# Patient Record
Sex: Female | Born: 1965 | Race: Black or African American | Hispanic: No | Marital: Married | Smoking: Never smoker
Health system: Southern US, Community
[De-identification: ages and names within clinical notes are randomized; demographics above are authoritative.]

## PROBLEM LIST (undated history)

## (undated) DIAGNOSIS — D649 Anemia, unspecified: Secondary | ICD-10-CM

## (undated) DIAGNOSIS — M7500 Adhesive capsulitis of unspecified shoulder: Secondary | ICD-10-CM

## (undated) DIAGNOSIS — I2510 Atherosclerotic heart disease of native coronary artery without angina pectoris: Secondary | ICD-10-CM

## (undated) DIAGNOSIS — F419 Anxiety disorder, unspecified: Secondary | ICD-10-CM

## (undated) DIAGNOSIS — M1990 Unspecified osteoarthritis, unspecified site: Secondary | ICD-10-CM

## (undated) HISTORY — PX: TUBAL LIGATION: SHX77

## (undated) HISTORY — DX: Anxiety disorder, unspecified: F41.9

## (undated) HISTORY — DX: Unspecified osteoarthritis, unspecified site: M19.90

## (undated) HISTORY — DX: Adhesive capsulitis of unspecified shoulder: M75.00

## (undated) HISTORY — DX: Anemia, unspecified: D64.9

---

## 1997-09-12 ENCOUNTER — Inpatient Hospital Stay (HOSPITAL_COMMUNITY): Admission: AD | Admit: 1997-09-12 | Discharge: 1997-09-14 | Payer: Self-pay | Admitting: Obstetrics and Gynecology

## 1999-12-17 ENCOUNTER — Other Ambulatory Visit: Admission: RE | Admit: 1999-12-17 | Discharge: 1999-12-17 | Payer: Self-pay | Admitting: Obstetrics and Gynecology

## 2001-03-08 ENCOUNTER — Other Ambulatory Visit: Admission: RE | Admit: 2001-03-08 | Discharge: 2001-03-08 | Payer: Self-pay | Admitting: Obstetrics and Gynecology

## 2001-11-24 ENCOUNTER — Encounter: Payer: Self-pay | Admitting: Family Medicine

## 2001-11-24 ENCOUNTER — Encounter: Admission: RE | Admit: 2001-11-24 | Discharge: 2001-11-24 | Payer: Self-pay | Admitting: Family Medicine

## 2002-04-22 ENCOUNTER — Other Ambulatory Visit: Admission: RE | Admit: 2002-04-22 | Discharge: 2002-04-22 | Payer: Self-pay | Admitting: Obstetrics and Gynecology

## 2004-07-16 ENCOUNTER — Ambulatory Visit (HOSPITAL_COMMUNITY): Admission: RE | Admit: 2004-07-16 | Discharge: 2004-07-16 | Payer: Self-pay | Admitting: Obstetrics and Gynecology

## 2005-05-09 ENCOUNTER — Emergency Department (HOSPITAL_COMMUNITY): Admission: EM | Admit: 2005-05-09 | Discharge: 2005-05-09 | Payer: Self-pay | Admitting: Family Medicine

## 2005-09-10 ENCOUNTER — Emergency Department (HOSPITAL_COMMUNITY): Admission: EM | Admit: 2005-09-10 | Discharge: 2005-09-10 | Payer: Self-pay | Admitting: Family Medicine

## 2005-12-17 ENCOUNTER — Emergency Department (HOSPITAL_COMMUNITY): Admission: EM | Admit: 2005-12-17 | Discharge: 2005-12-17 | Payer: Self-pay | Admitting: Family Medicine

## 2006-11-10 ENCOUNTER — Encounter: Admission: RE | Admit: 2006-11-10 | Discharge: 2006-11-10 | Payer: Self-pay | Admitting: Obstetrics and Gynecology

## 2007-02-06 ENCOUNTER — Emergency Department (HOSPITAL_COMMUNITY): Admission: EM | Admit: 2007-02-06 | Discharge: 2007-02-06 | Payer: Self-pay | Admitting: Emergency Medicine

## 2007-06-07 ENCOUNTER — Emergency Department (HOSPITAL_COMMUNITY): Admission: EM | Admit: 2007-06-07 | Discharge: 2007-06-07 | Payer: Self-pay | Admitting: Emergency Medicine

## 2008-01-04 ENCOUNTER — Encounter: Admission: RE | Admit: 2008-01-04 | Discharge: 2008-01-04 | Payer: Self-pay | Admitting: Obstetrics and Gynecology

## 2009-01-04 ENCOUNTER — Encounter: Admission: RE | Admit: 2009-01-04 | Discharge: 2009-01-04 | Payer: Self-pay | Admitting: Obstetrics and Gynecology

## 2009-05-03 ENCOUNTER — Encounter: Payer: Self-pay | Admitting: Gastroenterology

## 2009-05-04 ENCOUNTER — Encounter: Payer: Self-pay | Admitting: Gastroenterology

## 2009-05-23 ENCOUNTER — Ambulatory Visit: Payer: Self-pay | Admitting: Gastroenterology

## 2009-05-23 DIAGNOSIS — K625 Hemorrhage of anus and rectum: Secondary | ICD-10-CM

## 2009-05-23 DIAGNOSIS — K5900 Constipation, unspecified: Secondary | ICD-10-CM | POA: Insufficient documentation

## 2009-05-23 DIAGNOSIS — K648 Other hemorrhoids: Secondary | ICD-10-CM

## 2009-05-23 DIAGNOSIS — R195 Other fecal abnormalities: Secondary | ICD-10-CM

## 2009-05-24 ENCOUNTER — Ambulatory Visit: Payer: Self-pay | Admitting: Gastroenterology

## 2009-05-30 ENCOUNTER — Encounter: Payer: Self-pay | Admitting: Gastroenterology

## 2010-01-07 ENCOUNTER — Encounter: Admission: RE | Admit: 2010-01-07 | Discharge: 2010-01-07 | Payer: Self-pay | Admitting: Obstetrics and Gynecology

## 2010-05-07 NOTE — Letter (Signed)
Summary: [facility]   Imported By: Sherian Rein 09/06/2009 11:35:06  _____________________________________________________________________  External Attachment:    Type:   Image     Comment:   External Document

## 2010-05-07 NOTE — Letter (Signed)
Summary: Patient Notice- Polyp Results  [facility]  [address] [place], [place] [address]   Phone: [phone]  Fax: [phone]        May 30, 2009 MRN: [id number]    [facility] [address] [place], [place]  [address]    Dear Ms. Hoback,  I am pleased to inform you that the colon polyp(s) removed during your recent colonoscopy was (were) found to be benign (no cancer detected) upon pathologic examination.  I recommend you have a repeat colonoscopy examination in 5_ years to look for recurrent polyps, as having colon polyps increases your risk for having recurrent polyps or even colon cancer in the future.  Should you develop new or worsening symptoms of abdominal pain, bowel habit changes or bleeding from the rectum or bowels, please schedule an evaluation with either your primary care physician or with me.  Additional information/recommendations:  __ No further action with gastroenterology is needed at this time. Please      follow-up with your primary care physician for your other healthcare      needs.  __ Please call [phone] to schedule a return visit to review your      situation.  __ Please keep your follow-up visit as already scheduled.  _x_ Continue treatment plan as outlined the day of your exam.  Please call us if you are having persistent problems or have questions about your condition that have not been fully answered at this time.  Sincerely,  Louis Meckel MD  This letter has been electronically signed by your physician.  Appended Document: Patient Notice- Polyp Results  Letter mailed 2.24.11

## 2010-05-07 NOTE — Assessment & Plan Note (Signed)
Summary: blood in stools...em   History of Present Illness Visit Type: Initial Consult Primary GI MD: Melvia Heaps MD [facility] Primary Provider: Lavada Mesi, MD Requesting Provider: Lavada Mesi, MD Chief Complaint: rectal bleeding on tissue, constipaiton History of Present Illness:   [name] is a 45 year old African American female referred at the request of Dr. Prince Rome for rectal bleeding.  She has had constipation for years.  She has the sensation to move her bowels but has to strain to evacuate the stool.  Symptoms have not improved with amitiza.  She has had blood mixed with her stools and on the toilet tissue and has a history of fissures and hemorrhoids.   GI Review of Systems      Denies abdominal pain, acid reflux, belching, bloating, chest pain, dysphagia with liquids, dysphagia with solids, heartburn, loss of appetite, nausea, vomiting, vomiting blood, weight loss, and  weight gain.      Reports change in bowel habits, constipation, hemorrhoids, and  rectal bleeding.     Denies anal fissure, black tarry stools, diarrhea, diverticulosis, fecal incontinence, heme positive stool, irritable bowel syndrome, jaundice, light color stool, liver problems, and  rectal pain. Preventive Screening-Counseling & Management  Alcohol-Tobacco     Smoking Status: never      Drug Use:  no.      Current Medications (verified): 1)  Amitiza 24 Mcg Caps (Lubiprostone) .Marland Kitchen.. 1 By Mouth Two Times A Day 2)  Oyst-Cal D 250-125 Mg-Unit Tabs (Calcium Carbonate-Vitamin D) .Marland Kitchen.. 1 Tablet By Mouth Once Daily  Allergies (verified): No Known Drug Allergies  Past History:  Past Medical History: Anemia Anxiety Disorder Hemorrhoids  Past Surgical History: tubal ligation  Family History: Reviewed history and no changes required. Family History of Breast Cancer:MGM Aunt x 2 No FH of Colon Cancer:  Social History: Reviewed history and no changes required. Occupation: Product/process development scientist Patient has never  smoked.  Alcohol Use - yes Daily Caffeine Use Illicit Drug Use - no Smoking Status:  never Drug Use:  no  Review of Systems  The patient denies allergy/sinus, anemia, anxiety-new, arthritis/joint pain, back pain, blood in urine, breast changes/lumps, change in vision, confusion, cough, coughing up blood, depression-new, fainting, fatigue, fever, headaches-new, hearing problems, heart murmur, heart rhythm changes, itching, menstrual pain, muscle pains/cramps, night sweats, nosebleeds, pregnancy symptoms, shortness of breath, skin rash, sleeping problems, sore throat, swelling of feet/legs, swollen lymph glands, thirst - excessive , urination - excessive , urination changes/pain, urine leakage, vision changes, and voice change.    Vital Signs:  Patient profile:   45 year old female Height:      67 inches Weight:      157 pounds BMI:     24.68 Pulse rate:   84 / minute Pulse rhythm:   regular BP sitting:   120 / 70  (left arm)  Vitals Entered By: Milford Cage NCMA (May 23, 2009 1:59 PM)  Physical Exam  Additional Exam:  She is a well-developed well-nourished female  skin: anicter  HEENT: normocephalic; PEERLA; no nasal or orpharyngeal abnormalities neck: supple nodes: no cervical adenopathy chest: clear cor:  no murmurs, gallops or rubs abd:  bowel sounds normoactive; no abdominal masses, tenderness, organomegaly rectal: no masses;external hemorrhoids are present.  She has a palpable fissure.  Stool is red and Hemoccult-positive ext: no cyanosis, clubbing, or edema skeletal: no gross skeletal abnormalities neuro: alert, oriented x 3; no focal abnormalities    Impression & Recommendations:  Problem # 1:  CONSTIPATION (ICD-564.00) She may  have functional constipation though symptoms suggest  possible pelvic floor dysfunction.  Recommendations #1 D/C amitiza #2 colonoscopy #3 consider anal manometry and biofeedback therapy  Problem # 2:  RECTAL BLEEDING  (ICD-569.3) She has a local rectal disease including hemorrhoids and probable fissure.   No effective therapy can be done until obstipation is corrected.  Other Orders: Colonoscopy (Colon)  Patient Instructions: 1)  Constipation and Hemorrhoids brochure given.  2)  Colonoscopy and Flexible Sigmoidoscopy brochure given.  3)  Conscious Sedation brochure given.  4)  D/C Amitiza 5)  Your Colonoscopy is scheduled for 05/24/2009 at 3pm 6)  You can pick up your MoviPrep from your pharmacy today 7)  CC Dr. Casimiro Needle Hilts  8)  The medication list was reviewed and reconciled.  All changed / newly prescribed medications were explained.  A complete medication list was provided to the patient / caregiver. Prescriptions: MOVIPREP 100 GM  SOLR (PEG-KCL-NACL-NASULF-NA ASC-C) As per prep instructions.  #1 x 0   Entered by:   Merri Ray CMA (AAMA)   Authorized by:   Louis Meckel MD   Signed by:   Merri Ray CMA (AAMA) on 05/23/2009   Method used:   Electronically to        [facility]  [address] [phone]* (retail)       [address]       [place], [place]  [id number]       Ph: [phone] or [phone]       Fax: [phone]   RxID:   [id number]

## 2010-05-07 NOTE — Letter (Signed)
Summary: [facility] Instructions  [facility]  [address] [place], [place] [address]   Phone: [phone]  Fax: [phone]       Jessica Parks    [birth year]    MRN: [id number]        Procedure Day /Date:THURSDAY 05/24/2009     Arrival Time:2PM     Procedure Time:3PM     Location of Procedure:                    X   [facility] (4th Floor)                        PREPARATION FOR COLONOSCOPY WITH MOVIPREP   Starting 5 days prior to your procedure TODAY  do not eat nuts, seeds, popcorn, corn, beans, peas,  salads, or any raw vegetables.  Do not take any fiber supplements (e.g. Metamucil, Citrucel, and Benefiber).  THE DAY BEFORE YOUR PROCEDURE         DATE:05/23/2009  DAY: WED  1.  Drink clear liquids the entire day-NO SOLID FOOD  2.  Do not drink anything colored red or purple.  Avoid juices with pulp.  No orange juice.  3.  Drink at least 64 oz. (8 glasses) of fluid/clear liquids during the day to prevent dehydration and help the prep work efficiently.  CLEAR LIQUIDS INCLUDE: Water Jello Ice Popsicles Tea (sugar ok, no milk/cream) Powdered fruit flavored drinks Coffee (sugar ok, no milk/cream) Gatorade Juice: apple, white grape, white cranberry  Lemonade Clear bullion, consomm, broth Carbonated beverages (any kind) Strained chicken noodle soup Hard Candy                             4.  In the morning, mix first dose of MoviPrep solution:    Empty 1 Pouch A and 1 Pouch B into the disposable container    Add lukewarm drinking water to the top line of the container. Mix to dissolve    Refrigerate (mixed solution should be used within 24 hrs)  5.  Begin drinking the prep at 5:00 p.m. The MoviPrep container is divided by 4 marks.   Every 15 minutes drink the solution down to the next mark (approximately 8 oz) until the full liter is complete.   6.  Follow completed prep with 16 oz of clear liquid of your choice (Nothing red or  purple).  Continue to drink clear liquids until bedtime.  7.  Before going to bed, mix second dose of MoviPrep solution:    Empty 1 Pouch A and 1 Pouch B into the disposable container    Add lukewarm drinking water to the top line of the container. Mix to dissolve    Refrigerate  THE DAY OF YOUR PROCEDURE      DATE: 05/24/2009 DAY: THURSDAY Beginning at 10a.m. (5 hours before procedure):         1. Every 15 minutes, drink the solution down to the next mark (approx 8 oz) until the full liter is complete.  2. Follow completed prep with 16 oz. of clear liquid of your choice.    3. You may drink clear liquids until 1PM(2 HOURS BEFORE PROCEDURE).   MEDICATION INSTRUCTIONS  Unless otherwise instructed, you should take regular prescription medications with a small sip of water   as early as possible the morning of your procedure.  OTHER INSTRUCTIONS  You will need a responsible adult at least 45 years of age to accompany you and drive you home.   This person must remain in the waiting room during your procedure.  Wear loose fitting clothing that is easily removed.  Leave jewelry and other valuables at home.  However, you may wish to bring a book to read or  an iPod/MP3 player to listen to music as you wait for your procedure to start.  Remove all body piercing jewelry and leave at home.  Total time from sign-in until discharge is approximately 2-3 hours.  You should go home directly after your procedure and rest.  You can resume normal activities the  day after your procedure.  The day of your procedure you should not:   Drive   Make legal decisions   Operate machinery   Drink alcohol   Return to work  You will receive specific instructions about eating, activities and medications before you leave.    The above instructions have been reviewed and explained to me by   _______________________    I fully understand and can verbalize these instructions  _____________________________ Date _________

## 2010-05-07 NOTE — Procedures (Signed)
Summary: Colonoscopy  Patient: Jessica Parks Note: All result statuses are Final unless otherwise noted.  Tests: (1) Colonoscopy (COL)   COL Colonoscopy           DONE     [facility]     [address]     [place], [place]  [address]           COLONOSCOPY PROCEDURE REPORT           PATIENT:  Jessica, Parks  MR#:  [id number]     BIRTHDATE:  [birth year], 43 yrs. old  GENDER:  female           ENDOSCOPIST:  Barbette Hair. Arlyce Dice, MD     Referred by:           PROCEDURE DATE:  05/24/2009     PROCEDURE:  Colonoscopy with snare polypectomy     ASA CLASS:  Class I     INDICATIONS:  constipation, heme positive stool           MEDICATIONS:   Fentanyl 100 mcg IV, Versed 10 mg IV, Benadryl 50     mg IV           DESCRIPTION OF PROCEDURE:   After the risks benefits and     alternatives of the procedure were thoroughly explained, informed     consent was obtained.  Digital rectal exam was performed and     revealed perianal skin tags.   The LB [id number] endoscope     was introduced through the anus and advanced to the cecum, which     was identified by both the appendix and ileocecal valve, without     limitations.  The quality of the prep was good, using MoviPrep.     The instrument was then slowly withdrawn as the colon was fully     examined.     <<PROCEDUREIMAGES>>           FINDINGS:  A sessile polyp was found at the hepatic flexure. It     was 2 mm in size. Polyp was snared, then cauterized with monopolar     cautery. Retrieval was successful (see image5). snare polyp     Internal hemorrhoids were found (see image14). Anal tags and     hemorrhoids  This was otherwise a normal examination of the colon     (see image2, image3, image4, image6, image8, image10, image12, and     image13).   Retroflexed views in the rectum revealed no     abnormalities.    The scope was then withdrawn from the patient     and the procedure completed.           COMPLICATIONS:  None       ENDOSCOPIC IMPRESSION:     1) 2 mm sessile polyp at the hepatic flexure     2) Internal hemorrhoids     3) Otherwise normal examination     RECOMMENDATIONS:     1) If the polyp(s) removed today are proven to be adenomatous     (pre-cancerous) polyps, you will need a repeat colonoscopy in 5     years. Otherwise you should continue to follow colorectal cancer     screening guidelines for "routine risk" patients with colonoscopy     in 10 years.     2) call office next 1-3 days to schedule followup visit in 3-4     weeks     3) miralax 1 packet  every 2-3 days as needed           REPEAT EXAM:  In You will receive a letter from Dr. Arlyce Dice in 1-2     weeks, after reviewing the final pathology, with followup     recommendations. for.           ______________________________     Barbette Hair. Arlyce Dice, MD           CC:  Lavada Mesi, MD           n.     Rosalie Doctor:   Barbette Hair. Kaplan at 05/24/2009 03:40 PM           Page 2 of 3   [place], Brigandi Villanueva, [phone]  Note: An exclamation mark (!) indicates a result that was not dispersed into the flowsheet. Document Creation Date: 05/24/2009 3:40 PM _______________________________________________________________________  (1) Order result status: Final Collection or observation date-time: 05/24/2009 15:33 Requested date-time:  Receipt date-time:  Reported date-time:  Referring Physician:   Ordering Physician: Melvia Heaps [phone]) Specimen Source:  Source: Launa Grill Order Number: [id number] Lab site:   Appended Document: Colonoscopy     Procedures Next Due Date:    Colonoscopy: 06/2014

## 2010-05-07 NOTE — Letter (Signed)
Summary: Results Letter   Gastroenterology  [address] [place], [place] [address]   Phone: [phone]  Fax: [phone]        May 23, 2009 MRN: [id number]    [facility] [address] [place], [place]  [address]    Dear Ms. Bergman,  It is my pleasure to have treated you recently as a new patient in my office. I appreciate your confidence and the opportunity to participate in your care.  Since I do have a busy inpatient endoscopy schedule and office schedule, my office hours vary weekly. I am, however, available for emergency calls everyday through my office. If I am not available for an urgent office appointment, another one of our gastroenterologist will be able to assist you.  My well-trained staff are prepared to help you at all times. For emergencies after office hours, a physician from our Gastroenterology section is always available through my 24 hour answering service  Once again I welcome you as a new patient and I look forward to a happy and healthy relationship             Sincerely,  Louis Meckel MD  This letter has been electronically signed by your physician.  Appended Document: Results Letter letter mailed

## 2010-08-23 NOTE — Op Note (Signed)
Jessica Parks, Jessica Parks               ACCOUNT NO.:  [id number]   MEDICAL RECORD NO.:  [id number]          PATIENT TYPE:  AMB   LOCATION:  SDC                           FACILITY:  [facility]   PHYSICIAN:  Maxie Better, M.D.DATE OF BIRTH:  01-19-1966   DATE OF PROCEDURE:  07/16/2004  DATE OF DISCHARGE:                                 OPERATIVE REPORT   PREOPERATIVE DIAGNOSES:  Desires sterilization.   POSTOPERATIVE DIAGNOSES:  Desires sterilization, stage 1 pelvic  endometriosis.   PROCEDURE:  Laparoscopic tubal ligation with bipolar cautery.   ANESTHESIA:  General.   SURGEON:  Maxie Better, M.D.   INDICATIONS FOR PROCEDURE:  This is a 45 year old, gravida 2, para 2 female  who now presents for permanent sterilization, alternative birth control  methods were declined. The risks and benefits of the procedure have been  explained to the patient. Consent was signed. The patient was transferred to  the operating room.   DESCRIPTION OF PROCEDURE:  Under adequate general anesthesia, the patient  was placed in the dorsal lithotomy position, she was sterilely prepped and  draped in the usual fashion. The patient was catheterized of a scant amount  of urine. A bivalve speculum was placed in the vagina, single tooth  tenaculum was placed in the anterior lip of the cervix. An acorn cannula was  introduced in the cervical os and attached to the tenaculum for manipulation  of the uterus. The bivalve speculum was removed. Attention was then turned  to the abdomen. Marcaine 0.25% was injected infraumbilically. An  infraumbilical incision was then made, Veress needle was introduced into the  abdomen, tested with normal saline. Ultimately an opening pressure of 2 was  noted. 3 liters of CO2 was insufflated, Veress needle was removed. A  disposable 10 mm trocar was introduced into the abdomen without incident. A  lighted video laparoscope was then introduced. Inspection of the upper  abdomen  showed a normal liver edge, normal gallbladder. Attention was then  turned to the pelvis. A suprapubic incision was then made and under direct  visualization, a 5 mm port was introduced. The probe was then utilized to  inspect the pelvis. The appendix was noted to be normal. Both tubes and  ovaries were noted to be normal. A small paratubal cyst was noted  bilaterally. The posterior cul-de-sac had some red lesions in the posterior  cul-de-sac on the right in the right ovarian fossa consistent with an  endometriosis. The uterus otherwise was normal. Using bipolar cautery, the  mid portion of both fallopian tubes were then cauterized adequately. Some of  the endometriotic implants were then gently cauterized. When the procedure  was felt to be complete, the suprapubic port was removed under direct  visualization. The abdomen was deflated. The infraumbilical port was then  removed under direct visualization. #0 Vicryl figure-of-eight was placed in  the fascia infraumbilically and the skin incisions were then approximated  using Dermabond. The instruments in the vagina were removed. The cervix  was  inspected for any lacerations and none seen.   SPECIMENS:  None.   ESTIMATED BLOOD LOSS:  Minimal.   COMPLICATIONS:  None.   The patient tolerated the procedure well and was transferred to the recovery  room in stable condition.      [place]/MEDQ  D:  07/16/2004  T:  07/16/2004  Job:  [id number]

## 2010-12-17 ENCOUNTER — Other Ambulatory Visit: Payer: Self-pay | Admitting: Obstetrics and Gynecology

## 2010-12-17 DIAGNOSIS — Z1231 Encounter for screening mammogram for malignant neoplasm of breast: Secondary | ICD-10-CM

## 2010-12-23 ENCOUNTER — Telehealth: Payer: Self-pay | Admitting: Gastroenterology

## 2010-12-23 NOTE — Telephone Encounter (Signed)
Pt states she is having problems with constipation, requesting an appt with Dr. Arlyce Dice. Pt scheduled to see Dr. Arlyce Dice 12/27/10@9 :30am, pt aware of appt date and time.

## 2010-12-27 ENCOUNTER — Ambulatory Visit: Payer: Self-pay | Admitting: Gastroenterology

## 2011-01-10 ENCOUNTER — Ambulatory Visit
Admission: RE | Admit: 2011-01-10 | Discharge: 2011-01-10 | Disposition: A | Discharge status: Home / Self Care | Payer: BC Managed Care – PPO | Source: Ambulatory Visit | Attending: Obstetrics and Gynecology | Admitting: Obstetrics and Gynecology

## 2011-01-10 DIAGNOSIS — Z1231 Encounter for screening mammogram for malignant neoplasm of breast: Secondary | ICD-10-CM

## 2011-05-06 ENCOUNTER — Ambulatory Visit (INDEPENDENT_AMBULATORY_CARE_PROVIDER_SITE_OTHER): Payer: BC Managed Care – PPO | Admitting: Gastroenterology

## 2011-05-06 ENCOUNTER — Encounter: Payer: Self-pay | Admitting: Gastroenterology

## 2011-05-06 DIAGNOSIS — Z860100 Personal history of colon polyps, unspecified: Secondary | ICD-10-CM | POA: Insufficient documentation

## 2011-05-06 DIAGNOSIS — K5900 Constipation, unspecified: Secondary | ICD-10-CM

## 2011-05-06 NOTE — Progress Notes (Signed)
History of Present Illness: Jessica Parks is a pleasant 46 year old black female referred at the request of Dr. Prince Rome for evaluation of constipation. This has been an on going problem for years.  She may go at least a week without a bowel movement. Despite taking prunes and fiber she has to strain and has a sense of incomplete evacuation. She may see blood on the tissue with straining. Colonoscopy in 2007 demonstrated a small adenomatous polyp which was removed.    Past Medical History  Diagnosis Date  . Anemia   . Anxiety disorder   . Hemorrhoids    Past Surgical History  Procedure Date  . Tubal ligation    family history includes Breast cancer in her maternal aunts and maternal grandmother and Diabetes in her maternal aunt.  There is no history of Colon cancer. Current Outpatient Prescriptions  Medication Sig Dispense Refill  . B Complex Vitamins LIQD Place 1 mL under the tongue 2 (two) times a week.      . folic acid (FOLVITE) 1 MG tablet Take 1 mg by mouth daily.       Allergies as of 05/06/2011  . (No Known Allergies)    reports that she has never smoked. She has never used smokeless tobacco. She reports that she drinks alcohol. She reports that she does not use illicit drugs.     Review of Systems: Pertinent positive and negative review of systems were noted in the above HPI section. All other review of systems were otherwise negative.  Vital signs were reviewed in today's medical record Physical Exam: General: Well developed , well nourished, no acute distress Head: Normocephalic and atraumatic Eyes:  sclerae anicteric, EOMI Ears: Normal auditory acuity Mouth: No deformity or lesions Neck: Supple, no masses or thyromegaly Lungs: Clear throughout to auscultation Heart: Regular rate and rhythm; no murmurs, rubs or bruits Abdomen: Soft, non tender and non distended. No masses, hepatosplenomegaly or hernias noted. Normal Bowel sounds Rectal:deferred Musculoskeletal:  Symmetrical with no gross deformities  Skin: No lesions on visible extremities Pulses:  Normal pulses noted Extremities: No clubbing, cyanosis, edema or deformities noted Neurological: Alert oriented x 4, grossly nonfocal Cervical Nodes:  No significant cervical adenopathy Inguinal Nodes: No significant inguinal adenopathy Psychological:  Alert and cooperative. Normal mood and affect     \

## 2011-05-06 NOTE — Assessment & Plan Note (Addendum)
The patient has chronic idiopathic constipation.  Medications #1 she will consider enrollment in a constipation trial. If not, I will start her Linzess 145 mcg daily

## 2011-05-06 NOTE — Assessment & Plan Note (Signed)
Plan colonoscopy 2016 

## 2011-05-07 ENCOUNTER — Telehealth: Payer: Self-pay | Admitting: Gastroenterology

## 2011-05-07 MED ORDER — LINACLOTIDE 145 MCG PO CAPS: 145.0000 ug | ORAL_CAPSULE | Freq: Every day | ORAL | Status: DC

## 2011-05-07 NOTE — Telephone Encounter (Signed)
Spoke with pt and she does not want to do the constipation trial. Pt would like the med called in that Dr. Arlyce Dice mentioned. Rx sent to the pharmacy.

## 2011-05-08 ENCOUNTER — Ambulatory Visit (INDEPENDENT_AMBULATORY_CARE_PROVIDER_SITE_OTHER): Payer: BC Managed Care – PPO

## 2011-05-08 DIAGNOSIS — K5900 Constipation, unspecified: Secondary | ICD-10-CM

## 2011-09-26 ENCOUNTER — Telehealth: Payer: Self-pay | Admitting: Gastroenterology

## 2011-09-26 NOTE — Telephone Encounter (Signed)
Spoke with patient and confirmed recall 2016 for colon.

## 2011-12-02 ENCOUNTER — Other Ambulatory Visit: Payer: Self-pay | Admitting: Obstetrics and Gynecology

## 2011-12-02 DIAGNOSIS — Z1231 Encounter for screening mammogram for malignant neoplasm of breast: Secondary | ICD-10-CM

## 2012-01-12 ENCOUNTER — Ambulatory Visit
Admission: RE | Admit: 2012-01-12 | Discharge: 2012-01-12 | Disposition: A | Discharge status: Home / Self Care | Payer: PRIVATE HEALTH INSURANCE | Source: Ambulatory Visit | Attending: Obstetrics and Gynecology | Admitting: Obstetrics and Gynecology

## 2012-01-12 DIAGNOSIS — Z1231 Encounter for screening mammogram for malignant neoplasm of breast: Secondary | ICD-10-CM

## 2012-04-07 HISTORY — PX: BREAST BIOPSY: SHX20

## 2012-12-21 ENCOUNTER — Other Ambulatory Visit: Payer: Self-pay

## 2012-12-21 DIAGNOSIS — Z1231 Encounter for screening mammogram for malignant neoplasm of breast: Secondary | ICD-10-CM

## 2013-01-14 ENCOUNTER — Ambulatory Visit
Admission: RE | Admit: 2013-01-14 | Discharge: 2013-01-14 | Disposition: A | Discharge status: Home / Self Care | Payer: BC Managed Care – PPO | Source: Ambulatory Visit

## 2013-01-14 DIAGNOSIS — Z1231 Encounter for screening mammogram for malignant neoplasm of breast: Secondary | ICD-10-CM

## 2013-01-18 ENCOUNTER — Other Ambulatory Visit: Payer: Self-pay | Admitting: Obstetrics and Gynecology

## 2013-01-18 DIAGNOSIS — R928 Other abnormal and inconclusive findings on diagnostic imaging of breast: Secondary | ICD-10-CM

## 2013-02-08 ENCOUNTER — Ambulatory Visit
Admission: RE | Admit: 2013-02-08 | Discharge: 2013-02-08 | Disposition: A | Discharge status: Home / Self Care | Payer: BC Managed Care – PPO | Source: Ambulatory Visit | Attending: Obstetrics and Gynecology | Admitting: Obstetrics and Gynecology

## 2013-02-08 ENCOUNTER — Other Ambulatory Visit: Payer: Self-pay | Admitting: Obstetrics and Gynecology

## 2013-02-08 DIAGNOSIS — R928 Other abnormal and inconclusive findings on diagnostic imaging of breast: Secondary | ICD-10-CM

## 2013-02-10 ENCOUNTER — Ambulatory Visit
Admission: RE | Admit: 2013-02-10 | Discharge: 2013-02-10 | Disposition: A | Discharge status: Home / Self Care | Payer: BC Managed Care – PPO | Source: Ambulatory Visit | Attending: Obstetrics and Gynecology | Admitting: Obstetrics and Gynecology

## 2013-02-10 ENCOUNTER — Other Ambulatory Visit: Payer: Self-pay | Admitting: Obstetrics and Gynecology

## 2013-02-10 DIAGNOSIS — R928 Other abnormal and inconclusive findings on diagnostic imaging of breast: Secondary | ICD-10-CM

## 2013-08-12 ENCOUNTER — Ambulatory Visit (INDEPENDENT_AMBULATORY_CARE_PROVIDER_SITE_OTHER): Payer: BC Managed Care – PPO | Admitting: Gastroenterology

## 2013-08-12 ENCOUNTER — Encounter: Payer: Self-pay | Admitting: Gastroenterology

## 2013-08-12 VITALS — BP 90/60 | HR 98 | Ht 66.0 in | Wt 160.0 lb

## 2013-08-12 DIAGNOSIS — Z86010 Personal history of colonic polyps: Secondary | ICD-10-CM

## 2013-08-12 DIAGNOSIS — K5900 Constipation, unspecified: Secondary | ICD-10-CM

## 2013-08-12 DIAGNOSIS — K648 Other hemorrhoids: Secondary | ICD-10-CM

## 2013-08-12 NOTE — Progress Notes (Signed)
_                                                                                                                History of Present Illness: 48 year old Afro-American female with history of chronic idiopathic constipation here for persistent complaints of constipation and hemorrhoids.  She took Linzess in the past which caused crampy pain that was intolerable.  She's used various over-the-counter medications and MiraLax as well.  Despite this she has severe straining at the stool.  Stools are mushy and difficult to evacuate.  She has very frequent rectal discomfort with protrusion of hemorrhoids.  She complains of itching and occasional bleeding.  She's tried various topicals.  Colonoscopy in 2011 demonstrated an adenomatous polyp and internal hemorrhoids.    Past Medical History  Diagnosis Date  . Anemia   . Anxiety disorder   . Hemorrhoids    Past Surgical History  Procedure Laterality Date  . Tubal ligation     family history includes Breast cancer in her maternal aunt, maternal aunt, and maternal grandmother; Diabetes in her maternal aunt. There is no history of Colon cancer. Current Outpatient Prescriptions  Medication Sig Dispense Refill  . B Complex Vitamins LIQD Place 1 mL under the tongue 2 (two) times a week.      . folic acid (FOLVITE) 1 MG tablet Take 1 mg by mouth daily.      Marland Kitchen omega-3 acid ethyl esters (LOVAZA) 1 G capsule Take 1 g by mouth 2 (two) times daily.      . Vitamins A & D (VITAMIN A & D) 5000-400 UNITS CAPS Take 1 capsule by mouth 2 (two) times a week.       No current facility-administered medications for this visit.   Allergies as of 08/12/2013  . (No Known Allergies)    reports that she has never smoked. She has never used smokeless tobacco. She reports that she drinks alcohol. She reports that she does not use illicit drugs.     Review of Systems: Pertinent positive and negative review of systems were noted in the above HPI section.  All other review of systems were otherwise negative.  Vital signs were reviewed in today's medical record Physical Exam: General: Well developed , well nourished, no acute distress Skin: anicteric Head: Normocephalic and atraumatic Eyes:  sclerae anicteric, EOMI Ears: Normal auditory acuity Mouth: No deformity or lesions Neck: Supple, no masses or thyromegaly Lungs: Clear throughout to auscultation Heart: Regular rate and rhythm; no murmurs, rubs or bruits Abdomen: Soft, non tender and non distended. No masses, hepatosplenomegaly or hernias noted. Normal Bowel sounds Rectal: A large grade 3 hemorrhoids and external skin tags are present Musculoskeletal: Symmetrical with no gross deformities  Skin: No lesions on visible extremities Pulses:  Normal pulses noted Extremities: No clubbing, cyanosis, edema or deformities noted Neurological: Alert oriented x 4, grossly nonfocal Cervical Nodes:  No significant cervical adenopathy Inguinal Nodes: No significant inguinal adenopathy Psychological:  Alert and cooperative. Normal mood and affect  See  Assessment and Plan under Problem List

## 2013-08-12 NOTE — Assessment & Plan Note (Signed)
Plan follow-up colonoscopy 2016 

## 2013-08-12 NOTE — Assessment & Plan Note (Signed)
Patient has symptomatic internal hemorrhoids.  Once constipation is resolved I will proceed with band ligation

## 2013-08-12 NOTE — Patient Instructions (Signed)
We are giving you Amitiza samples today Take Benefiber daily two times daily Call back in 1 week if no better

## 2013-08-12 NOTE — Assessment & Plan Note (Signed)
She continues to suffer from constipation which undoubtedly is exacerbating hemorrhoids.    Recommendations #1 trial of amitiza 24 mcg twice a day; Benefiber twice a day

## 2014-02-22 ENCOUNTER — Other Ambulatory Visit: Payer: Self-pay

## 2014-02-22 DIAGNOSIS — Z1231 Encounter for screening mammogram for malignant neoplasm of breast: Secondary | ICD-10-CM

## 2014-03-17 ENCOUNTER — Ambulatory Visit: Payer: BC Managed Care – PPO

## 2014-03-17 ENCOUNTER — Ambulatory Visit
Admission: RE | Admit: 2014-03-17 | Discharge: 2014-03-17 | Disposition: A | Discharge status: Home / Self Care | Payer: BC Managed Care – PPO | Source: Ambulatory Visit

## 2014-03-17 DIAGNOSIS — Z1231 Encounter for screening mammogram for malignant neoplasm of breast: Secondary | ICD-10-CM

## 2014-04-27 ENCOUNTER — Ambulatory Visit (INDEPENDENT_AMBULATORY_CARE_PROVIDER_SITE_OTHER): Payer: BLUE CROSS/BLUE SHIELD | Admitting: Gastroenterology

## 2014-04-27 ENCOUNTER — Encounter: Payer: Self-pay | Admitting: Gastroenterology

## 2014-04-27 VITALS — BP 124/74 | HR 76 | Ht 66.75 in | Wt 158.4 lb

## 2014-04-27 DIAGNOSIS — K648 Other hemorrhoids: Secondary | ICD-10-CM

## 2014-04-27 DIAGNOSIS — Z86010 Personal history of colonic polyps: Secondary | ICD-10-CM

## 2014-04-27 NOTE — Progress Notes (Signed)
Patient is here today to discuss treatment of hemorrhoids.  She has chronic and recurrent itching and rectal discomfort.  She's tried various topicals with only temporary success.  PROCEDURE NOTE: The patient presents with symptomatic grade *3**  hemorrhoids, requesting rubber band ligation of his/her hemorrhoidal disease.  All risks, benefits and alternative forms of therapy were described and informed consent was obtained.   The anorectum was pre-medicated with lubricant and nitroglycerine ointment The decision was made to band the *right posterior* internal hemorrhoid, and the [place] was used to perform band ligation without complication.  Digital anorectal examination was then performed to assure proper positioning of the band, and to adjust the banded tissue as required.  The patient was discharged home without pain or other issues.  Dietary and behavioral recommendations were given and along with follow-up instructions.    The patient will return in *2** weeks for  follow-up and possible additional banding as required. No complications were encountered and the patient tolerated the procedure well.

## 2014-04-27 NOTE — Addendum Note (Signed)
Addended by: Erskine Emery D on: 04/27/2014 10:09 AM   Modules accepted: Level of Service

## 2014-04-27 NOTE — Assessment & Plan Note (Signed)
Plan follow-up colonoscopy for history of adenomatous polyps

## 2014-04-27 NOTE — Patient Instructions (Signed)
It has been recommended to you by your physician that you have a(n) Colonoscopy completed. Per your request, we did not schedule the procedure(s) today. Please contact our office at [phone] should you decide to have the procedure completed.  Your 2nd banding is scheduled on 06/14/2014 at 10:45am  [place]   1. The procedure you have had should have been relatively painless since the banding of the area involved does not have nerve endings and there is no pain sensation.  The rubber band cuts off the blood supply to the hemorrhoid and the band may fall off as soon as 48 hours after the banding (the band may occasionally be seen in the toilet bowl following a bowel movement). You may notice a temporary feeling of fullness in the rectum which should respond adequately to plain Tylenol or Motrin.  2. Following the banding, avoid strenuous exercise that evening and resume full activity the next day.  A sitz bath (soaking in a warm tub) or bidet is soothing, and can be useful for cleansing the area after bowel movements.     3. To avoid constipation, take two tablespoons of natural wheat bran, natural oat bran, flax, Benefiber or any over the counter fiber supplement and increase your water intake to 7-8 glasses daily.    4. Unless you have been prescribed anorectal medication, do not put anything inside your rectum for two weeks: No suppositories, enemas, fingers, etc.  5. Occasionally, you may have more bleeding than usual after the banding procedure.  This is often from the untreated hemorrhoids rather than the treated one.  Don't be concerned if there is a tablespoon or so of blood.  If there is more blood than this, lie flat with your bottom higher than your head and apply an ice pack to the area. If the bleeding does not stop within a half an hour or if you feel faint, call our office at [phone] or go to the emergency room.  6. Problems are not  common; however, if there is a substantial amount of bleeding, severe pain, chills, fever or difficulty passing urine (very rare) or other problems, you should call us at [phone] or report to the nearest emergency room.  7. Do not stay seated continuously for more than 2-3 hours for a day or two after the procedure.  Tighten your buttock muscles 10-15 times every two hours and take 10-15 deep breaths every 1-2 hours.  Do not spend more than a few minutes on the toilet if you cannot empty your bowel; instead re-visit the toilet at a later time.

## 2014-06-14 ENCOUNTER — Encounter: Payer: Self-pay | Admitting: Gastroenterology

## 2014-06-14 ENCOUNTER — Encounter: Payer: BLUE CROSS/BLUE SHIELD | Admitting: Gastroenterology

## 2014-06-14 ENCOUNTER — Ambulatory Visit (INDEPENDENT_AMBULATORY_CARE_PROVIDER_SITE_OTHER): Payer: BLUE CROSS/BLUE SHIELD | Admitting: Gastroenterology

## 2014-06-14 VITALS — BP 110/60 | HR 76 | Ht 66.75 in | Wt 158.2 lb

## 2014-06-14 DIAGNOSIS — K642 Third degree hemorrhoids: Secondary | ICD-10-CM

## 2014-06-14 DIAGNOSIS — K648 Other hemorrhoids: Secondary | ICD-10-CM

## 2014-06-14 NOTE — Patient Instructions (Signed)
HEMORRHOID BANDING PROCEDURE    FOLLOW-UP CARE   1. The procedure you have had should have been relatively painless since the banding of the area involved does not have nerve endings and there is no pain sensation.  The rubber band cuts off the blood supply to the hemorrhoid and the band may fall off as soon as 48 hours after the banding (the band may occasionally be seen in the toilet bowl following a bowel movement). You may notice a temporary feeling of fullness in the rectum which should respond adequately to plain Tylenol or Motrin.  2. Following the banding, avoid strenuous exercise that evening and resume full activity the next day.  A sitz bath (soaking in a warm tub) or bidet is soothing, and can be useful for cleansing the area after bowel movements.     3. To avoid constipation, take two tablespoons of natural wheat bran, natural oat bran, flax, Benefiber or any over the counter fiber supplement and increase your water intake to 7-8 glasses daily.    4. Unless you have been prescribed anorectal medication, do not put anything inside your rectum for two weeks: No suppositories, enemas, fingers, etc.  5. Occasionally, you may have more bleeding than usual after the banding procedure.  This is often from the untreated hemorrhoids rather than the treated one.  Don't be concerned if there is a tablespoon or so of blood.  If there is more blood than this, lie flat with your bottom higher than your head and apply an ice pack to the area. If the bleeding does not stop within a half an hour or if you feel faint, call our office at [phone] or go to the emergency room.  6. Problems are not common; however, if there is a substantial amount of bleeding, severe pain, chills, fever or difficulty passing urine (very rare) or other problems, you should call us at [phone] or report to the nearest emergency room.  7. Do not stay seated continuously for more than 2-3 hours for a day or two  after the procedure.  Tighten your buttock muscles 10-15 times every two hours and take 10-15 deep breaths every 1-2 hours.  Do not spend more than a few minutes on the toilet if you cannot empty your bowel; instead re-visit the toilet at a later time.   Your 3rd banding is scheduled on 07/28/2014 at 10:15am

## 2014-06-14 NOTE — Progress Notes (Signed)
PROCEDURE NOTE: The patient presents with symptomatic grade **3*  hemorrhoids, requesting rubber band ligation of his/her hemorrhoidal disease.  All risks, benefits and alternative forms of therapy were described and informed consent was obtained.   The anorectum was pre-medicated with lubricant and nitroglycerine ointment The decision was made to band the **right anterior* internal hemorrhoid, and the [place] was used to perform band ligation without complication.  Digital anorectal examination was then performed to assure proper positioning of the band, and to adjust the banded tissue as required.  The patient was discharged home without pain or other issues.  Dietary and behavioral recommendations were given and along with follow-up instructions.    The patient will return in *2** weeks for  follow-up and possible additional banding as required. No complications were encountered and the patient tolerated the procedure well.

## 2014-06-16 ENCOUNTER — Telehealth: Payer: Self-pay | Admitting: Gastroenterology

## 2014-06-16 ENCOUNTER — Other Ambulatory Visit: Payer: Self-pay

## 2014-06-16 NOTE — Telephone Encounter (Signed)
Work note for patient. She is aware to pick up the letter from the front desk of our office.

## 2014-07-28 ENCOUNTER — Telehealth: Payer: Self-pay | Admitting: *Deleted

## 2014-07-28 ENCOUNTER — Encounter: Payer: Self-pay | Admitting: Gastroenterology

## 2014-07-28 ENCOUNTER — Ambulatory Visit (INDEPENDENT_AMBULATORY_CARE_PROVIDER_SITE_OTHER): Payer: BLUE CROSS/BLUE SHIELD | Admitting: Gastroenterology

## 2014-07-28 VITALS — BP 120/70 | HR 88 | Ht 66.75 in | Wt 159.4 lb

## 2014-07-28 DIAGNOSIS — K648 Other hemorrhoids: Secondary | ICD-10-CM

## 2014-07-28 NOTE — Telephone Encounter (Signed)
===  View-only below this line===  ----- Message -----    From: Inda Castle, MD    Sent: 07/28/2014   1:51 PM      To: Oda Kilts, CMA Subject: RE: Colonoscopy                                Yes she is due for colonoscopy ----- Message -----    From: Oda Kilts, CMA    Sent: 07/28/2014  11:25 AM      To: Inda Castle, MD Subject: Colonoscopy                                    Is she due for a colonoscopy?? There is no recall in the system on her.......Marland Kitchen

## 2014-07-28 NOTE — Progress Notes (Signed)
PROCEDURE NOTE: The patient presents with symptomatic grade **3*  hemorrhoids, requesting rubber band ligation of his/her hemorrhoidal disease.  All risks, benefits and alternative forms of therapy were described and informed consent was obtained.   The anorectum was pre-medicated with lubricant and nitroglycerine ointment The decision was made to band the *left lateral** internal hemorrhoid, and the [place] was used to perform band ligation without complication.  Digital anorectal examination was then performed to assure proper positioning of the band, and to adjust the banded tissue as required.  The patient was discharged home without pain or other issues.  Dietary and behavioral recommendations were given and along with follow-up instructions.    The patient will return in **4* weeks for  follow-up No complications were encountered and the patient tolerated the procedure well.

## 2014-07-28 NOTE — Patient Instructions (Signed)
HEMORRHOID BANDING PROCEDURE    FOLLOW-UP CARE   1. The procedure you have had should have been relatively painless since the banding of the area involved does not have nerve endings and there is no pain sensation.  The rubber band cuts off the blood supply to the hemorrhoid and the band may fall off as soon as 48 hours after the banding (the band may occasionally be seen in the toilet bowl following a bowel movement). You may notice a temporary feeling of fullness in the rectum which should respond adequately to plain Tylenol or Motrin.  2. Following the banding, avoid strenuous exercise that evening and resume full activity the next day.  A sitz bath (soaking in a warm tub) or bidet is soothing, and can be useful for cleansing the area after bowel movements.     3. To avoid constipation, take two tablespoons of natural wheat bran, natural oat bran, flax, Benefiber or any over the counter fiber supplement and increase your water intake to 7-8 glasses daily.    4. Unless you have been prescribed anorectal medication, do not put anything inside your rectum for two weeks: No suppositories, enemas, fingers, etc.  5. Occasionally, you may have more bleeding than usual after the banding procedure.  This is often from the untreated hemorrhoids rather than the treated one.  Don't be concerned if there is a tablespoon or so of blood.  If there is more blood than this, lie flat with your bottom higher than your head and apply an ice pack to the area. If the bleeding does not stop within a half an hour or if you feel faint, call our office at [phone] or go to the emergency room.  6. Problems are not common; however, if there is a substantial amount of bleeding, severe pain, chills, fever or difficulty passing urine (very rare) or other problems, you should call us at [phone] or report to the nearest emergency room.  7. Do not stay seated continuously for more than 2-3 hours for a day or two  after the procedure.  Tighten your buttock muscles 10-15 times every two hours and take 10-15 deep breaths every 1-2 hours.  Do not spend more than a few minutes on the toilet if you cannot empty your bowel; instead re-visit the toilet at a later time.   Your Follow up appointment is scheduled on 09/25/2014 at 9:45am

## 2014-07-28 NOTE — Telephone Encounter (Signed)
Called patient to inform she needs a colonoscopy she will call back to schedule

## 2014-09-25 ENCOUNTER — Ambulatory Visit: Payer: BLUE CROSS/BLUE SHIELD | Admitting: Gastroenterology

## 2015-01-15 ENCOUNTER — Encounter: Payer: Self-pay | Admitting: Gastroenterology

## 2015-02-07 ENCOUNTER — Other Ambulatory Visit: Payer: Self-pay | Admitting: Obstetrics and Gynecology

## 2015-02-07 DIAGNOSIS — R928 Other abnormal and inconclusive findings on diagnostic imaging of breast: Secondary | ICD-10-CM

## 2015-02-09 ENCOUNTER — Ambulatory Visit
Admission: RE | Admit: 2015-02-09 | Discharge: 2015-02-09 | Disposition: A | Discharge status: Home / Self Care | Payer: BLUE CROSS/BLUE SHIELD | Source: Ambulatory Visit | Attending: Obstetrics and Gynecology | Admitting: Obstetrics and Gynecology

## 2015-02-09 DIAGNOSIS — R928 Other abnormal and inconclusive findings on diagnostic imaging of breast: Secondary | ICD-10-CM

## 2015-03-02 ENCOUNTER — Encounter: Payer: Self-pay | Admitting: Gastroenterology

## 2015-03-16 ENCOUNTER — Ambulatory Visit (AMBULATORY_SURGERY_CENTER): Payer: Self-pay

## 2015-03-16 VITALS — Ht 67.0 in | Wt 163.0 lb

## 2015-03-16 DIAGNOSIS — Z860100 Personal history of colon polyps, unspecified: Secondary | ICD-10-CM

## 2015-03-16 MED ORDER — SUPREP BOWEL PREP KIT 17.5-3.13-1.6 GM/177ML PO SOLN
1.0000 | Freq: Once | ORAL | Status: DC
Start: 2015-03-16 — End: 2015-03-30

## 2015-03-16 NOTE — Progress Notes (Signed)
No allergies to eggs or soy or flu shot No diet/weight loss meds No home oxygen No past problems with anesthesia  Has email and internet; registered emmi

## 2015-03-19 ENCOUNTER — Encounter: Payer: Self-pay | Admitting: Gastroenterology

## 2015-03-30 ENCOUNTER — Telehealth: Payer: Self-pay | Admitting: *Deleted

## 2015-03-30 ENCOUNTER — Ambulatory Visit (AMBULATORY_SURGERY_CENTER): Payer: BLUE CROSS/BLUE SHIELD | Admitting: Gastroenterology

## 2015-03-30 ENCOUNTER — Encounter: Payer: Self-pay | Admitting: Gastroenterology

## 2015-03-30 ENCOUNTER — Telehealth: Payer: Self-pay | Admitting: Gastroenterology

## 2015-03-30 VITALS — BP 102/67 | HR 64 | Temp 98.6°F | Resp 20 | Ht 67.0 in | Wt 163.0 lb

## 2015-03-30 DIAGNOSIS — K635 Polyp of colon: Secondary | ICD-10-CM

## 2015-03-30 DIAGNOSIS — Z86010 Personal history of colonic polyps: Secondary | ICD-10-CM | POA: Diagnosis not present

## 2015-03-30 DIAGNOSIS — D122 Benign neoplasm of ascending colon: Secondary | ICD-10-CM

## 2015-03-30 MED ORDER — SODIUM CHLORIDE 0.9 % IV SOLN: 500.0000 mL | INTRAVENOUS | Status: DC

## 2015-03-30 NOTE — Progress Notes (Signed)
Called to room to assist during endoscopic procedure.  Patient ID and intended procedure confirmed with present staff. Received instructions for my participation in the procedure from the performing physician.  

## 2015-03-30 NOTE — Telephone Encounter (Signed)
Patient does not have a preference for surgeon. Per procedure report- needs referral to surgeon to evaluate/remove anal canal polyp and evaluate large skin tag. Spoke with Janett Billow at [facility] and scheduled with Dr. Johney Maine on 04/23/15 at 8:15 AM. Patient notified of appointment.

## 2015-03-30 NOTE — Progress Notes (Signed)
Report to PACU, RN, vss, BBS= Clear.  

## 2015-03-30 NOTE — Telephone Encounter (Signed)
Advised pt our recommendation is to stay home the remainder of the day and she can resume her normal activities tomorrow.

## 2015-03-30 NOTE — Patient Instructions (Signed)
YOU HAD AN ENDOSCOPIC PROCEDURE TODAY AT [facility]:   Refer to the procedure report that was given to you for any specific questions about what was found during the examination.  If the procedure report does not answer your questions, please call your gastroenterologist to clarify.  If you requested that your care partner not be given the details of your procedure findings, then the procedure report has been included in a sealed envelope for you to review at your convenience later.  YOU SHOULD EXPECT: Some feelings of bloating in the abdomen. Passage of more gas than usual.  Walking can help get rid of the air that was put into your GI tract during the procedure and reduce the bloating. If you had a lower endoscopy (such as a colonoscopy or flexible sigmoidoscopy) you may notice spotting of blood in your stool or on the toilet paper. If you underwent a bowel prep for your procedure, you may not have a normal bowel movement for a few days.  Please Note:  You might notice some irritation and congestion in your nose or some drainage.  This is from the oxygen used during your procedure.  There is no need for concern and it should clear up in a day or so.  SYMPTOMS TO REPORT IMMEDIATELY:   Following lower endoscopy (colonoscopy or flexible sigmoidoscopy):  Excessive amounts of blood in the stool  Significant tenderness or worsening of abdominal pains  Swelling of the abdomen that is new, acute  Fever of 100F or higher   For urgent or emergent issues, a gastroenterologist can be reached at any hour by calling [phone].   DIET: Your first meal following the procedure should be a small meal and then it is ok to progress to your normal diet. Heavy or fried foods are harder to digest and may make you feel nauseous or bloated.  Likewise, meals heavy in dairy and vegetables can increase bloating.  Drink plenty of fluids but you should avoid alcoholic beverages for 24  hours.  ACTIVITY:  You should plan to take it easy for the rest of today and you should NOT DRIVE or use heavy machinery until tomorrow (because of the sedation medicines used during the test).    FOLLOW UP: Our staff will call the number listed on your records the next business day following your procedure to check on you and address any questions or concerns that you may have regarding the information given to you following your procedure. If we do not reach you, we will leave a message.  However, if you are feeling well and you are not experiencing any problems, there is no need to return our call.  We will assume that you have returned to your regular daily activities without incident.  If any biopsies were taken you will be contacted by phone or by letter within the next 1-3 weeks.  Please call us at [phone] if you have not heard about the biopsies in 3 weeks.    SIGNATURES/CONFIDENTIALITY: You and/or your care partner have signed paperwork which will be entered into your electronic medical record.  These signatures attest to the fact that that the information above on your After Visit Summary has been reviewed and is understood.  Full responsibility of the confidentiality of this discharge information lies with you and/or your care-partner.  Polyp/ hemorrhoid handout given Await pathology results General Surgery to evaluate Rectal polyp

## 2015-03-30 NOTE — Op Note (Signed)
[facility]  [address] [place], [address]   COLONOSCOPY PROCEDURE REPORT  PATIENT: Jessica Parks  MR#: [id number] BIRTHDATE: 28-Oct-1965 , 49  yrs. old GENDER: female ENDOSCOPIST: Yetta Flock, MD REFERRED BY: Willey Blade MD PROCEDURE DATE:  03/30/2015 PROCEDURE:   Colonoscopy, surveillance and Colonoscopy with biopsy First Screening Colonoscopy - Avg.  risk and is 50 yrs.  old or older - No.  Prior Negative Screening - Now for repeat screening. N/A  History of Adenoma - Now for follow-up colonoscopy & has been > or = to 3 yrs.  Yes hx of adenoma.  Has been 3 or more years since last colonoscopy.  Polyps removed today? Yes ASA CLASS:   Class II INDICATIONS:Surveillance due to prior colonic neoplasia and Colorectal Neoplasm Risk Assessment for this procedure is average risk. MEDICATIONS: Propofol 400 mg IV  DESCRIPTION OF PROCEDURE:   After the risks benefits and alternatives of the procedure were thoroughly explained, informed consent was obtained.  The digital rectal exam revealed external hemorrhoids and revealed a skin tag.   The [id number] endoscope was introduced through the anus and advanced to the cecum, which was identified by both the appendix and ileocecal valve. No adverse events experienced.   The quality of the prep was good.  The instrument was then slowly withdrawn as the colon was fully examined. Estimated blood loss is zero unless otherwise noted in this procedure report.   COLON FINDINGS: A sessile polyp measuring 3 mm in size was found in the ascending colon.  A polypectomy was performed with cold forceps.  The resection was complete, the polyp tissue was completely retrieved and sent to histology.   The examination was otherwise normal.   On retroflexion there was evidence of hypertrophied anal papillae and what appeared to be a pedunculated anal canal polyp arising from below the dentate line.  Multiple images  obtained.  Retroflexion as described previously. The time to cecum = 4.2 Withdrawal time = 16.7   The scope was withdrawn and the procedure completed. COMPLICATIONS: There were no immediate complications.  ENDOSCOPIC IMPRESSION: 1.   Sessile polyp was found in the ascending colon; polypectomy was performed with cold forceps 2.   The examination was otherwise normal 3.  On retroflexion there was evidence of hypertrophied anal papillae and what appeared to be a pedunculated anal canal polyp arising from below the dentate line.  Multiple images obtained 4.   Large external skin tag / hemorrhoid  RECOMMENDATIONS: Await pathology results Resume diet Resume medications General surgery consult to evaluate / remove anal canal polyp, and evaluate large skin tag   eSigned:  Yetta Flock, MD 03/30/2015 8:14 AM   cc: Willey Blade MD, the patient   PATIENT NAME:  Jessica Parks, Jessica Parks MR#: [id number]

## 2015-04-03 ENCOUNTER — Telehealth: Payer: Self-pay | Admitting: *Deleted

## 2015-04-03 NOTE — Telephone Encounter (Signed)
Agree with recommendations.  

## 2015-04-03 NOTE — Telephone Encounter (Signed)
  Follow up Call-  Call back number 03/30/2015  Post procedure Call Back phone  # [phone]  Permission to leave phone message Yes     Patient questions:  Do you have a fever, pain , or abdominal swelling? No. Pain Score  0 *  Have you tolerated food without any problems? Yes.    Have you been able to return to your normal activities? Yes.    Do you have any questions about your discharge instructions: Diet   No. Medications  No. Follow up visit  No.  Do you have questions or concerns about your Care? Yes.    Actions: * If pain score is 4 or above: No action needed, pain <4.  Pt.states that she has not had a BM since her colonoscopy Friday.  She is using Miralax.  Not eating a great deal.  She denies any pain, or abdominal distention.  She is passing gas.  I advised her to  Get some fiber in, raw fruit,vegetables, and she suggested bran.  She will try this, along with increasing water intake and her miralax.  I encouraged her to call back in a day or so if she has not had a BM.  She agreed to this plan.

## 2015-08-06 ENCOUNTER — Emergency Department (HOSPITAL_COMMUNITY): Payer: BLUE CROSS/BLUE SHIELD

## 2015-08-06 ENCOUNTER — Emergency Department (HOSPITAL_COMMUNITY)
Admission: EM | Admit: 2015-08-06 | Discharge: 2015-08-06 | Disposition: A | Discharge status: Home / Self Care | Payer: BLUE CROSS/BLUE SHIELD | Attending: Emergency Medicine | Admitting: Emergency Medicine

## 2015-08-06 DIAGNOSIS — S29001A Unspecified injury of muscle and tendon of front wall of thorax, initial encounter: Secondary | ICD-10-CM | POA: Diagnosis present

## 2015-08-06 DIAGNOSIS — S3992XA Unspecified injury of lower back, initial encounter: Secondary | ICD-10-CM | POA: Insufficient documentation

## 2015-08-06 DIAGNOSIS — S20212A Contusion of left front wall of thorax, initial encounter: Secondary | ICD-10-CM | POA: Insufficient documentation

## 2015-08-06 DIAGNOSIS — Z8719 Personal history of other diseases of the digestive system: Secondary | ICD-10-CM | POA: Diagnosis not present

## 2015-08-06 DIAGNOSIS — Z79899 Other long term (current) drug therapy: Secondary | ICD-10-CM | POA: Insufficient documentation

## 2015-08-06 DIAGNOSIS — Z8659 Personal history of other mental and behavioral disorders: Secondary | ICD-10-CM | POA: Insufficient documentation

## 2015-08-06 DIAGNOSIS — Y92410 Unspecified street and highway as the place of occurrence of the external cause: Secondary | ICD-10-CM | POA: Insufficient documentation

## 2015-08-06 DIAGNOSIS — Y9389 Activity, other specified: Secondary | ICD-10-CM | POA: Diagnosis not present

## 2015-08-06 DIAGNOSIS — Z862 Personal history of diseases of the blood and blood-forming organs and certain disorders involving the immune mechanism: Secondary | ICD-10-CM | POA: Insufficient documentation

## 2015-08-06 DIAGNOSIS — S99912A Unspecified injury of left ankle, initial encounter: Secondary | ICD-10-CM | POA: Insufficient documentation

## 2015-08-06 DIAGNOSIS — Y998 Other external cause status: Secondary | ICD-10-CM | POA: Diagnosis not present

## 2015-08-06 DIAGNOSIS — M791 Myalgia, unspecified site: Secondary | ICD-10-CM

## 2015-08-06 DIAGNOSIS — M25572 Pain in left ankle and joints of left foot: Secondary | ICD-10-CM

## 2015-08-06 MED ORDER — CYCLOBENZAPRINE HCL 10 MG PO TABS: 10.0000 mg | ORAL_TABLET | Freq: Two times a day (BID) | ORAL | Status: DC | PRN

## 2015-08-06 MED ORDER — HYDROCODONE-ACETAMINOPHEN 5-325 MG PO TABS
1.0000 | ORAL_TABLET | Freq: Once | ORAL | Status: AC
  Administered 2015-08-06: 1 via ORAL
  Filled 2015-08-06: qty 1

## 2015-08-06 MED ORDER — NAPROXEN 500 MG PO TABS: 500.0000 mg | ORAL_TABLET | Freq: Two times a day (BID) | ORAL | Status: DC
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
## 2015-08-06 NOTE — ED Notes (Signed)
Pt changed into gown.

## 2015-08-06 NOTE — ED Notes (Signed)
Pt reports understanding of discharge information. No questions at time of discharge 

## 2015-12-19 ENCOUNTER — Telehealth: Payer: Self-pay | Admitting: *Deleted

## 2015-12-19 NOTE — Telephone Encounter (Signed)
Attempted to reach pt. Via phone to acertain whether or not she had surgery in January for anal polyp, per Dr. Doyne Keel Query.  Unable to reach pt.

## 2015-12-19 NOTE — Telephone Encounter (Signed)
Ok, if not able to reach by phone we can send a letter for her to call us. Thanks

## 2015-12-21 ENCOUNTER — Encounter: Payer: Self-pay | Admitting: *Deleted

## 2016-02-11 ENCOUNTER — Other Ambulatory Visit: Payer: Self-pay | Admitting: Obstetrics and Gynecology

## 2016-02-11 DIAGNOSIS — Z1231 Encounter for screening mammogram for malignant neoplasm of breast: Secondary | ICD-10-CM

## 2016-02-12 ENCOUNTER — Ambulatory Visit
Admission: RE | Admit: 2016-02-12 | Discharge: 2016-02-12 | Disposition: A | Discharge status: Home / Self Care | Payer: BLUE CROSS/BLUE SHIELD | Source: Ambulatory Visit | Attending: Obstetrics and Gynecology | Admitting: Obstetrics and Gynecology

## 2016-02-12 DIAGNOSIS — Z1231 Encounter for screening mammogram for malignant neoplasm of breast: Secondary | ICD-10-CM

## 2016-08-27 LAB — HM HEPATITIS C SCREENING LAB: HM Hepatitis Screen: NEGATIVE

## 2016-09-23 ENCOUNTER — Ambulatory Visit (INDEPENDENT_AMBULATORY_CARE_PROVIDER_SITE_OTHER): Payer: BLUE CROSS/BLUE SHIELD | Admitting: Orthopaedic Surgery

## 2016-09-23 ENCOUNTER — Ambulatory Visit (INDEPENDENT_AMBULATORY_CARE_PROVIDER_SITE_OTHER): Payer: Self-pay | Admitting: Orthopaedic Surgery

## 2016-09-23 ENCOUNTER — Encounter (INDEPENDENT_AMBULATORY_CARE_PROVIDER_SITE_OTHER): Payer: Self-pay | Admitting: Orthopaedic Surgery

## 2016-09-23 ENCOUNTER — Ambulatory Visit (INDEPENDENT_AMBULATORY_CARE_PROVIDER_SITE_OTHER): Payer: Self-pay

## 2016-09-23 DIAGNOSIS — M19011 Primary osteoarthritis, right shoulder: Secondary | ICD-10-CM

## 2016-09-23 NOTE — Progress Notes (Signed)
Office Visit Note   Patient: Jessica Parks           Date of Birth: [birth year]           MRN: [id number] Visit Date: 09/23/2016              Requested by: Willey Blade, [address] [place], [place] [address] PCP: Willey Blade, MD   Assessment & Plan: Visit Diagnoses:  1. Arthritis of right acromioclavicular joint     Plan: Patient has symptomatic acromioclavicular joint with arthritis. She declined injection. She really has Voltaren gel. Continue with NSAIDs and avoid overhead lifting. Follow-up with me as needed.  Follow-Up Instructions: Return if symptoms worsen or fail to improve.   Orders:  Orders Placed This Encounter  Procedures  . XR Shoulder Right   No orders of the defined types were placed in this encounter.     Procedures: No procedures performed   Clinical Data: No additional findings.   Subjective: Chief Complaint  Patient presents with  . Right Shoulder - Pain    Patient is a 51 year old female with right shoulder pain along the acromioclavicular joint. This is worse with lifting and straining. She denies any radicular symptoms. There is a pinching sensation in the shoulder. Denies any numbness or tingling.    Review of Systems  Constitutional: Negative.   HENT: Negative.   Eyes: Negative.   Respiratory: Negative.   Cardiovascular: Negative.   Endocrine: Negative.   Musculoskeletal: Negative.   Neurological: Negative.   Hematological: Negative.   Psychiatric/Behavioral: Negative.   All other systems reviewed and are negative.    Objective: Vital Signs: There were no vitals taken for this visit.  Physical Exam  Constitutional: She is oriented to person, place, and time. She appears well-developed and well-nourished.  HENT:  Head: Normocephalic and atraumatic.  Eyes: EOM are normal.  Neck: Neck supple.  Pulmonary/Chest: Effort normal.  Abdominal: Soft.  Neurological: She is alert and oriented to  person, place, and time.  Skin: Skin is warm. Capillary refill takes less than 2 seconds.  Psychiatric: She has a normal mood and affect. Her behavior is normal. Judgment and thought content normal.  Nursing note and vitals reviewed.   Ortho Exam Right shoulder exam shows positive cross adduction sign. Coracoclavicular joint is tender to palpation. Rotator cuff testing is normal. Negative speed and negative O'Brien negative impingement bicipital groove is nontender. Specialty Comments:  No specialty comments available.  Imaging: Xr Shoulder Right  Result Date: 09/23/2016 Degenerative changes of AC joint.    PMFS History: Patient Active Problem List   Diagnosis Date Noted  . Arthritis of right acromioclavicular joint 09/23/2016  . History of colonic polyps 05/06/2011  . Internal hemorrhoids 05/23/2009  . CONSTIPATION 05/23/2009   Past Medical History:  Diagnosis Date  . Anemia   . Anxiety disorder   . Hemorrhoids     Family History  Problem Relation Age of Onset  . Breast cancer Maternal Grandmother   . Breast cancer Maternal Aunt   . Breast cancer Maternal Aunt   . Colon cancer Neg Hx   . Diabetes Maternal Aunt     Past Surgical History:  Procedure Laterality Date  . TUBAL LIGATION     Social History   Occupational History  . unemployed [employer]   Social History Main Topics  . Smoking status: Never Smoker  . Smokeless tobacco: Never Used  . Alcohol use Yes     Comment: socially  .  Drug use: No  . Sexual activity: Not on file       

## 2016-09-24 ENCOUNTER — Telehealth (INDEPENDENT_AMBULATORY_CARE_PROVIDER_SITE_OTHER): Payer: Self-pay | Admitting: Orthopaedic Surgery

## 2016-09-24 MED ORDER — DICLOFENAC SODIUM 1 % TD GEL: 2.0000 g | Freq: Four times a day (QID) | TRANSDERMAL | 5 refills | Status: DC

## 2016-09-24 NOTE — Addendum Note (Signed)
Addended by: Azucena Cecil on: 09/24/2016 10:07 PM   Modules accepted: Orders

## 2016-09-24 NOTE — Telephone Encounter (Signed)
Patient called stating she does now want the voltaren gel sent into her pharmacy on [address] on [address]. CB # [phone]

## 2016-10-02 ENCOUNTER — Other Ambulatory Visit (INDEPENDENT_AMBULATORY_CARE_PROVIDER_SITE_OTHER): Payer: Self-pay | Admitting: Radiology

## 2016-10-02 MED ORDER — DICLOFENAC SODIUM 2 % TD SOLN: 1.0000 | Freq: Two times a day (BID) | TRANSDERMAL | 2 refills | Status: DC

## 2017-02-10 ENCOUNTER — Other Ambulatory Visit: Payer: Self-pay | Admitting: Obstetrics and Gynecology

## 2017-02-10 DIAGNOSIS — Z1231 Encounter for screening mammogram for malignant neoplasm of breast: Secondary | ICD-10-CM

## 2017-03-12 ENCOUNTER — Ambulatory Visit
Admission: RE | Admit: 2017-03-12 | Discharge: 2017-03-12 | Disposition: A | Discharge status: Home / Self Care | Payer: BLUE CROSS/BLUE SHIELD | Source: Ambulatory Visit | Attending: Obstetrics and Gynecology | Admitting: Obstetrics and Gynecology

## 2017-03-12 DIAGNOSIS — Z1231 Encounter for screening mammogram for malignant neoplasm of breast: Secondary | ICD-10-CM

## 2017-07-13 ENCOUNTER — Encounter: Payer: Self-pay | Admitting: Gastroenterology

## 2017-10-13 ENCOUNTER — Other Ambulatory Visit: Payer: Self-pay | Admitting: Obstetrics and Gynecology

## 2017-10-13 DIAGNOSIS — Z1231 Encounter for screening mammogram for malignant neoplasm of breast: Secondary | ICD-10-CM

## 2017-10-23 ENCOUNTER — Encounter (INDEPENDENT_AMBULATORY_CARE_PROVIDER_SITE_OTHER): Payer: Self-pay | Admitting: Orthopaedic Surgery

## 2017-10-23 ENCOUNTER — Ambulatory Visit (INDEPENDENT_AMBULATORY_CARE_PROVIDER_SITE_OTHER): Payer: BLUE CROSS/BLUE SHIELD | Admitting: Orthopaedic Surgery

## 2017-10-23 ENCOUNTER — Ambulatory Visit (INDEPENDENT_AMBULATORY_CARE_PROVIDER_SITE_OTHER): Payer: Self-pay

## 2017-10-23 DIAGNOSIS — M25512 Pain in left shoulder: Secondary | ICD-10-CM | POA: Insufficient documentation

## 2017-10-23 MED ORDER — METHYLPREDNISOLONE ACETATE 40 MG/ML IJ SUSP
40.0000 mg | INTRAMUSCULAR | Status: AC | PRN
  Administered 2017-10-23: 40 mg via INTRA_ARTICULAR

## 2017-10-23 MED ORDER — LIDOCAINE HCL 2 % IJ SOLN
2.0000 mL | INTRAMUSCULAR | Status: AC | PRN
  Administered 2017-10-23: 2 mL

## 2017-10-23 MED ORDER — BUPIVACAINE HCL 0.25 % IJ SOLN
2.0000 mL | INTRAMUSCULAR | Status: AC | PRN
  Administered 2017-10-23: 2 mL via INTRA_ARTICULAR

## 2017-10-23 NOTE — Progress Notes (Signed)
Office Visit Note   Patient: Jessica Parks           Date of Birth: [birth year]           MRN: [id number] Visit Date: 10/23/2017              Requested by: Willey Blade, [address] [place], [place] [address] PCP: Glendale Chard, MD   Assessment & Plan: Visit Diagnoses:  1. Left shoulder pain, unspecified chronicity     Plan: Impression is left shoulder subacromial bursitis.  Today, we injected the left shoulder subacromial space with cortisone.  I am also sending the patient to formal physical therapy to work on a Jobe exercise program.  A prescription was given to the patient today for this.  She will follow-up with [place] as needed.  Follow-Up Instructions: Return if symptoms worsen or fail to improve.   Orders:  Orders Placed This Encounter  Procedures  . Large Joint Inj: L subacromial bursa  . XR Shoulder Left  . Ambulatory referral to Physical Therapy   No orders of the defined types were placed in this encounter.     Procedures: Large Joint Inj: L subacromial bursa on 10/23/2017 9:13 AM Indications: pain Details: 22 G needle Medications: 2 mL lidocaine 2 %; 2 mL bupivacaine 0.25 %; 40 mg methylPREDNISolone acetate 40 MG/ML Outcome: tolerated well, no immediate complications Patient was prepped and draped in the usual sterile fashion.       Clinical Data: No additional findings.   Subjective: Chief Complaint  Patient presents with  . Left Shoulder - Pain    HPI patient is a pleasant 52 year old female who presents to our clinic today with left shoulder pain.  This began approximately 2 months ago without any known injury.  She does note however that she was often sleeping on the left side and is unsure whether or not this aggravated things.  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  The pain she has is to the shoulder joint itself and radiates down into the deltoid.  Pain is worse with forward flexion and internal rotation.   She has tried ice and heat with minimal relief of symptoms.  No numbness, tingling or burning to her hand.  No previous cortisone injection or surgical intervention of the left shoulder.  She does have a history of adhesive capsulitis and AC arthropathy to the right shoulder which was resolved with formal physical therapy.  Review of Systems as detailed in HPI.  All others reviewed and are negative.   Objective: Vital Signs: There were no vitals taken for this visit.  Physical Exam well-developed well-nourished female in no acute distress.  Alert and oriented x3.  Ortho Exam examination of the left shoulder reveals full active range of motion in all planes.  Increased pain with the extremes of forward flexion and internal rotation.  She can internally rotate to T12.  Positive empty can.  Negative cross body abduction.  She is neurovascularly intact distally.  Specialty Comments:  No specialty comments available.  Imaging: Xr Shoulder Left  Result Date: 10/23/2017 Degenerative changes to the [facility] joint    PMFS History: Patient Active Problem List   Diagnosis Date Noted  . Left shoulder pain 10/23/2017  . Arthritis of right acromioclavicular joint 09/23/2016  . History of colonic polyps 05/06/2011  . Internal hemorrhoids 05/23/2009  . CONSTIPATION 05/23/2009   Past Medical History:  Diagnosis Date  . Anemia   .  Anxiety disorder   . Hemorrhoids     Family History  Problem Relation Age of Onset  . Breast cancer Maternal Grandmother   . Breast cancer Maternal Aunt   . Breast cancer Maternal Aunt   . Diabetes Maternal Aunt   . Colon cancer Neg Hx     Past Surgical History:  Procedure Laterality Date  . BREAST BIOPSY Left 2014   benign  . TUBAL LIGATION     Social History   Occupational History  . Occupation: unemployed    Fish farm manager: [employer]  Tobacco Use  . Smoking status: Never Smoker  . Smokeless tobacco: Never Used  Substance and Sexual Activity  . Alcohol  use: Yes    Comment: socially  . Drug use: No  . Sexual activity: Not on file

## 2018-02-17 ENCOUNTER — Encounter (INDEPENDENT_AMBULATORY_CARE_PROVIDER_SITE_OTHER): Payer: Self-pay | Admitting: Orthopaedic Surgery

## 2018-02-17 ENCOUNTER — Ambulatory Visit (INDEPENDENT_AMBULATORY_CARE_PROVIDER_SITE_OTHER): Payer: BLUE CROSS/BLUE SHIELD | Admitting: Orthopaedic Surgery

## 2018-02-17 DIAGNOSIS — M7502 Adhesive capsulitis of left shoulder: Secondary | ICD-10-CM

## 2018-02-17 MED ORDER — LIDOCAINE HCL 1 % IJ SOLN
5.0000 mL | INTRAMUSCULAR | Status: AC | PRN
  Administered 2018-02-17: 5 mL

## 2018-02-17 MED ORDER — METHYLPREDNISOLONE ACETATE 40 MG/ML IJ SUSP
40.0000 mg | INTRAMUSCULAR | Status: AC | PRN
  Administered 2018-02-17: 40 mg via INTRA_ARTICULAR

## 2018-02-17 MED ORDER — IBUPROFEN 800 MG PO TABS: 800.0000 mg | ORAL_TABLET | Freq: Three times a day (TID) | ORAL | 3 refills | Status: DC | PRN

## 2018-02-17 MED ORDER — TIZANIDINE HCL 2 MG PO TABS: 2.0000 mg | ORAL_TABLET | Freq: Four times a day (QID) | ORAL | 1 refills | Status: DC | PRN

## 2018-02-17 MED ORDER — IBUPROFEN 800 MG PO TABS: 800.0000 mg | ORAL_TABLET | Freq: Three times a day (TID) | ORAL | 2 refills | Status: DC | PRN

## 2018-02-17 NOTE — Addendum Note (Signed)
Addended by: Hortencia Pilar on: 02/17/2018 02:31 PM   Modules accepted: Orders

## 2018-02-17 NOTE — Progress Notes (Signed)
Office Visit Note   Patient: Jessica Parks           Date of Birth: [birth year]           MRN: [id number] Visit Date: 02/17/2018              Requested by: Glendale Chard, [address] [place], [place] [address] PCP: Glendale Chard, MD   Assessment & Plan: Visit Diagnoses:  1. Adhesive capsulitis of left shoulder     Plan: Impression is adhesive capsulitis of left shoulder.  We referred her to Dr. Junius Roads for an ultrasound glenohumeral injection today.  I would like to reevaluate her in 6 weeks.  If she does not notice any significant improvement we we will likely repeat the injection at that time.  In the meantime she should continue with physical therapy.  Prescription for ibuprofen.  Follow-Up Instructions: Return in about 6 weeks (around 03/31/2018).   Orders:  No orders of the defined types were placed in this encounter.  Meds ordered this encounter  Medications  . ibuprofen (ADVIL,MOTRIN) 800 MG tablet    Sig: Take 1 tablet (800 mg total) by mouth every 8 (eight) hours as needed.    Dispense:  60 tablet    Refill:  2      Procedures: No procedures performed   Clinical Data: No additional findings.   Subjective: Chief Complaint  Patient presents with  . Left Shoulder - Pain, Follow-up    Jessica Parks is a 52 year old female comes in with continued left shoulder pain since June.  She has been doing physical therapy but she has had difficulty with range of motion and stiffness.  She also has pain that is worse at night.  She denies any numbness and tingling.  She had a subacromial injection in July which did not help significantly.   Review of Systems  Constitutional: Negative.   HENT: Negative.   Eyes: Negative.   Respiratory: Negative.   Cardiovascular: Negative.   Endocrine: Negative.   Musculoskeletal: Negative.   Neurological: Negative.   Hematological: Negative.   Psychiatric/Behavioral: Negative.   All other systems reviewed and are  negative.    Objective: Vital Signs: There were no vitals taken for this visit.  Physical Exam  Constitutional: She is oriented to person, place, and time. She appears well-developed and well-nourished.  Pulmonary/Chest: Effort normal.  Neurological: She is alert and oriented to person, place, and time.  Skin: Skin is warm. Capillary refill takes less than 2 seconds.  Psychiatric: She has a normal mood and affect. Her behavior is normal. Judgment and thought content normal.  Nursing note and vitals reviewed.   Ortho Exam Left shoulder exam shows significant stiffness with forward flexion, external rotation, internal rotation.  Her rotator cuff testing is grossly normal. Specialty Comments:  No specialty comments available.  Imaging: No results found.   PMFS History: Patient Active Problem List   Diagnosis Date Noted  . Adhesive capsulitis of left shoulder 02/17/2018  . Left shoulder pain 10/23/2017  . Arthritis of right acromioclavicular joint 09/23/2016  . History of colonic polyps 05/06/2011  . Internal hemorrhoids 05/23/2009  . CONSTIPATION 05/23/2009   Past Medical History:  Diagnosis Date  . Anemia   . Anxiety disorder   . Hemorrhoids     Family History  Problem Relation Age of Onset  . Breast cancer Maternal Grandmother   . Breast cancer Maternal Aunt   . Breast cancer Maternal Aunt   . Diabetes  Maternal Aunt   . Colon cancer Neg Hx     Past Surgical History:  Procedure Laterality Date  . BREAST BIOPSY Left 2014   benign  . TUBAL LIGATION     Social History   Occupational History  . Occupation: unemployed    Fish farm manager: [employer]  Tobacco Use  . Smoking status: Never Smoker  . Smokeless tobacco: Never Used  Substance and Sexual Activity  . Alcohol use: Yes    Comment: socially  . Drug use: No  . Sexual activity: Not on file

## 2018-02-17 NOTE — Progress Notes (Addendum)
   Procedure Note  Patient: Jessica Parks             Date of Birth: [birth year]           MRN: [id number]             Visit Date: 02/17/2018  Procedures: Visit Diagnoses: Adhesive capsulitis of left shoulder  Large Joint Inj: L glenohumeral on 02/17/2018 2:28 PM Indications: pain Details: 22 G 3.5 in needle, ultrasound-guided posterior approach  Arthrogram: No  Medications: 5 mL lidocaine 1 %; 40 mg methylPREDNISolone acetate 40 MG/ML  Doing an injection into the glenohumeral joint for adhesive capsulitis.  Using 22-gauge spinal needle, injected under ultrasound guidance into the posterior glenohumeral joint.  Injectate was seen filling the joint capsule.  She will follow-up as directed.

## 2018-03-15 ENCOUNTER — Ambulatory Visit
Admission: RE | Admit: 2018-03-15 | Discharge: 2018-03-15 | Disposition: A | Discharge status: Home / Self Care | Payer: BLUE CROSS/BLUE SHIELD | Source: Ambulatory Visit | Attending: Obstetrics and Gynecology | Admitting: Obstetrics and Gynecology

## 2018-03-15 DIAGNOSIS — Z1231 Encounter for screening mammogram for malignant neoplasm of breast: Secondary | ICD-10-CM

## 2018-03-16 ENCOUNTER — Other Ambulatory Visit: Payer: Self-pay | Admitting: Obstetrics and Gynecology

## 2018-03-16 DIAGNOSIS — R928 Other abnormal and inconclusive findings on diagnostic imaging of breast: Secondary | ICD-10-CM

## 2018-03-19 ENCOUNTER — Ambulatory Visit
Admission: RE | Admit: 2018-03-19 | Discharge: 2018-03-19 | Disposition: A | Discharge status: Home / Self Care | Payer: BLUE CROSS/BLUE SHIELD | Source: Ambulatory Visit | Attending: Obstetrics and Gynecology | Admitting: Obstetrics and Gynecology

## 2018-03-19 ENCOUNTER — Other Ambulatory Visit: Payer: Self-pay | Admitting: Obstetrics and Gynecology

## 2018-03-19 DIAGNOSIS — R928 Other abnormal and inconclusive findings on diagnostic imaging of breast: Secondary | ICD-10-CM

## 2018-03-19 DIAGNOSIS — N6002 Solitary cyst of left breast: Secondary | ICD-10-CM

## 2018-04-05 ENCOUNTER — Encounter: Payer: Self-pay | Admitting: Gastroenterology

## 2018-04-06 ENCOUNTER — Ambulatory Visit (INDEPENDENT_AMBULATORY_CARE_PROVIDER_SITE_OTHER): Payer: BLUE CROSS/BLUE SHIELD | Admitting: Orthopaedic Surgery

## 2018-04-06 ENCOUNTER — Encounter (INDEPENDENT_AMBULATORY_CARE_PROVIDER_SITE_OTHER): Payer: Self-pay | Admitting: Orthopaedic Surgery

## 2018-04-06 VITALS — Wt 164.0 lb

## 2018-04-06 DIAGNOSIS — M7502 Adhesive capsulitis of left shoulder: Secondary | ICD-10-CM | POA: Diagnosis not present

## 2018-04-06 NOTE — Progress Notes (Signed)
Subjective: She is here with persistent left shoulder pain due to adhesive capsulitis.  Objective: Limited active and passive range of motion of her shoulder with pain.  Procedure: Ultrasound-guided left glenohumeral injection: After sterile prep with Betadine, injected 10 cc 1% lidocaine without epinephrine and 40 mg methylprednisolone using a 22-gauge spinal needle passing the needle into the posterior glenohumeral joint, injectate was seen filling the joint capsule.  Follow-up as directed.

## 2018-04-06 NOTE — Progress Notes (Signed)
   Office Visit Note   Patient: Jessica Parks           Date of Birth: 1965-10-17           MRN: [id number] Visit Date: 04/06/2018              Requested by: Glendale Chard, [address] [place], [place] [address] PCP: Glendale Chard, MD   Assessment & Plan: Visit Diagnoses:  1. Adhesive capsulitis of left shoulder     Plan: Impression is left shoulder adhesive capsulitis.  At this point, we will refer the patient to Dr. Junius Roads for a repeat intra-articular cortisone injection to the left shoulder.  She will restart physical therapy next week.  Follow-up with [place] in 6 weeks time for recheck.  Follow-Up Instructions: Return in about 6 weeks (around 05/18/2018).   Orders:  No orders of the defined types were placed in this encounter.  No orders of the defined types were placed in this encounter.     Procedures: No procedures performed   Clinical Data: No additional findings.   Subjective: Chief Complaint  Patient presents with  . Left Shoulder - Follow-up    HPI patient is a pleasant 52 year old female presents to our clinic today for follow-up of her left shoulder.  History of adhesive capsulitis.  Previous cortisone injection by Dr. Ernestina Patches on 02/17/2018 to the glenohumeral space.  She did note moderate relief of pain, but continues to have pain in physical therapy.  Her range of motion has minimally improved.  Review of Systems as detailed HPI.  All others reviewed and are negative.   Objective: Vital Signs: Wt 164 lb (74.4 kg)   BMI 25.69 kg/m   Physical Exam well-developed well-nourished female in no acute distress.  Alert and oriented x3.  Ortho Exam examination of her left shoulder reveals 50% range of motion in all planes.  She is neurovascularly intact distally.  Specialty Comments:  No specialty comments available.  Imaging: No new imaging   PMFS History: Patient Active Problem List   Diagnosis Date Noted  . Adhesive capsulitis of  left shoulder 02/17/2018  . Left shoulder pain 10/23/2017  . Arthritis of right acromioclavicular joint 09/23/2016  . History of colonic polyps 05/06/2011  . Internal hemorrhoids 05/23/2009  . CONSTIPATION 05/23/2009   Past Medical History:  Diagnosis Date  . Anemia   . Anxiety disorder   . Hemorrhoids     Family History  Problem Relation Age of Onset  . Breast cancer Maternal Grandmother   . Breast cancer Maternal Aunt   . Breast cancer Maternal Aunt   . Diabetes Maternal Aunt   . Colon cancer Neg Hx     Past Surgical History:  Procedure Laterality Date  . BREAST BIOPSY Left 2014   benign  . TUBAL LIGATION     Social History   Occupational History  . Occupation: unemployed    Fish farm manager: [employer]  Tobacco Use  . Smoking status: Never Smoker  . Smokeless tobacco: Never Used  Substance and Sexual Activity  . Alcohol use: Yes    Comment: socially  . Drug use: No  . Sexual activity: Not on file

## 2018-04-28 ENCOUNTER — Encounter: Payer: Self-pay | Admitting: Gastroenterology

## 2018-04-28 ENCOUNTER — Ambulatory Visit: Payer: BLUE CROSS/BLUE SHIELD | Admitting: Gastroenterology

## 2018-04-28 VITALS — BP 126/70 | HR 74 | Ht 66.0 in | Wt 163.0 lb

## 2018-04-28 DIAGNOSIS — K5900 Constipation, unspecified: Secondary | ICD-10-CM | POA: Diagnosis not present

## 2018-04-28 DIAGNOSIS — K649 Unspecified hemorrhoids: Secondary | ICD-10-CM | POA: Diagnosis not present

## 2018-04-28 DIAGNOSIS — K620 Anal polyp: Secondary | ICD-10-CM | POA: Diagnosis not present

## 2018-04-28 NOTE — Patient Instructions (Signed)
If you are age 53 or older, your body mass index should be between 23-30. Your Body mass index is 26.31 kg/m. If this is out of the aforementioned range listed, please consider follow up with your Primary Care Provider.  If you are age 1 or younger, your body mass index should be between 19-25. Your Body mass index is 26.31 kg/m. If this is out of the aformentioned range listed, please consider follow up with your Primary Care Provider.   You have been scheduled for a flexible sigmoidoscopy. Please follow the written instructions given to you at your visit today. If you use inhalers (even only as needed), please bring them with you on the day of your procedure.   Thank you for entrusting me with your care and for choosing [facility], Dr. Westmere Cellar

## 2018-04-28 NOTE — Progress Notes (Signed)
HPI :  53 year old female here for a follow-up visit. She was last seen by our office in December 2016. At that point in time she had a colonoscopy showing a benign ascending colon polyp. She was also noted to have a large anal canal polypoid lesion and hypertrophic anal papilla. This had caused her irritation and I referred her to Dr. Richardson Landry gross colorectal surgery in Jan 2017. She saw him, notes reviewed, they discussed whether this was a hypertophied papillae versus prolapsed internal hemorrhoid which would not resolve without surgery. She declined surgery at that time.   Over time she is been doing pretty well in regards to her bowels. She has a history of constipation which bothers her, however she's been using MiraLAX most days and this works to help keep her stools regular, she has a bowel movement every day. She denies any blood in her stools. She does have some irritation in the anal area at times, and can feel the tissue prolapse at times. She does not have any family history of colon cancer. She's bit anxious about what this and if she needs it removed.   Colonoscopy 03/30/2015 - 4mm ascending polyp - benign lymphoid aggregate, anal canal polyp / hypertrophied papillae -    Past Medical History:  Diagnosis Date  . Anemia   . Anxiety disorder   . Frozen shoulder    left- PT only 04/28/2018  . Hemorrhoids      Past Surgical History:  Procedure Laterality Date  . BREAST BIOPSY Left 2014   benign  . TUBAL LIGATION     Family History  Problem Relation Age of Onset  . Breast cancer Maternal Grandmother   . Breast cancer Maternal Aunt   . Breast cancer Maternal Aunt   . Diabetes Maternal Aunt   . Colon cancer Neg Hx    Social History   Tobacco Use  . Smoking status: Never Smoker  . Smokeless tobacco: Never Used  Substance Use Topics  . Alcohol use: Yes    Comment: socially  . Drug use: No   Current Outpatient Medications  Medication Sig Dispense Refill  . Biotin 1  MG CAPS Take by mouth daily.    Marland Kitchen ibuprofen (ADVIL,MOTRIN) 800 MG tablet Take 1 tablet (800 mg total) by mouth every 8 (eight) hours as needed. 90 tablet 3  . Multiple Vitamin (MULTIVITAMIN) tablet Take 1 tablet by mouth daily.    Marland Kitchen omega-3 acid ethyl esters (LOVAZA) 1 G capsule Take 1 g by mouth daily.      No current facility-administered medications for this visit.    No Known Allergies   Review of Systems: All systems reviewed and negative except where noted in HPI.    No results found.  Physical Exam: BP 126/70   Pulse 74   Ht 5\' 6"  (1.676 m)   Wt 163 lb (73.9 kg)   BMI 26.31 kg/m  Constitutional: Pleasant,well-developed,female in no acute distress. HEENT: Normocephalic and atraumatic. Conjunctivae are normal. No scleral icterus. Neck supple.  Cardiovascular: Normal rate, regular rhythm.  Pulmonary/chest: Effort normal and breath sounds normal. No wheezing, rales or rhonchi. Abdominal: Soft, nondistended, nontender.There are no masses palpable. No hepatomegaly. DRE - deferred Extremities: no edema Lymphadenopathy: No cervical adenopathy noted. Neurological: Alert and oriented to person place and time. Skin: Skin is warm and dry. No rashes noted. Psychiatric: Normal mood and affect. Behavior is normal.   ASSESSMENT AND PLAN: 53 year old female here for reassessment of the following:  Consultation /  anal canal polypoid lesion / hemorrhoids - again unclear if this anal canal lesion is a chronically prolapsed internal hemorrhoid versus a large hypertrophied anal papillae or other polypoid lesion.  If the latter could be associated with AIN. Her symptoms bother her periodically, she is questioning whether or not she wants to have surgery for this, seen by Dr. Johney Maine previously. We discussed options. I offered to evaluate that today with an anoscopy versus flexible sigmoidoscopy to reevaluate the area and take a biopsy if amenable to rule out AIN. She deferred anoscopy today and  preferred to have a flex sig after discussion of each. If this has enlarged / growing over time, surgery will be recommended. This will not resolve without operative management if it continues to bother her, can discuss options after flex sig. Otherwise continue Miralax for constipation which is working well.  Thurmont Cellar, MD [facility]

## 2018-04-30 ENCOUNTER — Encounter: Payer: Self-pay | Admitting: Gastroenterology

## 2018-05-04 ENCOUNTER — Encounter: Payer: BLUE CROSS/BLUE SHIELD | Admitting: Gastroenterology

## 2018-05-14 ENCOUNTER — Ambulatory Visit (AMBULATORY_SURGERY_CENTER): Payer: BLUE CROSS/BLUE SHIELD | Admitting: Gastroenterology

## 2018-05-14 ENCOUNTER — Encounter: Payer: Self-pay | Admitting: Gastroenterology

## 2018-05-14 VITALS — BP 109/73 | HR 56 | Temp 96.9°F | Resp 11 | Ht 66.0 in | Wt 163.0 lb

## 2018-05-14 DIAGNOSIS — D128 Benign neoplasm of rectum: Secondary | ICD-10-CM

## 2018-05-14 DIAGNOSIS — K644 Residual hemorrhoidal skin tags: Secondary | ICD-10-CM

## 2018-05-14 DIAGNOSIS — K620 Anal polyp: Secondary | ICD-10-CM

## 2018-05-14 DIAGNOSIS — D129 Benign neoplasm of anus and anal canal: Secondary | ICD-10-CM

## 2018-05-14 DIAGNOSIS — K6289 Other specified diseases of anus and rectum: Secondary | ICD-10-CM | POA: Diagnosis not present

## 2018-05-14 MED ORDER — SODIUM CHLORIDE 0.9 % IV SOLN: 500.0000 mL | Freq: Once | INTRAVENOUS | Status: DC

## 2018-05-14 NOTE — Progress Notes (Signed)
Called to room to assist during endoscopic procedure.  Patient ID and intended procedure confirmed with present staff. Received instructions for my participation in the procedure from the performing physician.  

## 2018-05-14 NOTE — Progress Notes (Signed)
To PACU, VSS. Report to [email or web address] 

## 2018-05-14 NOTE — Op Note (Signed)
[place] Patient Name: Jessica Parks Procedure Date: 05/14/2018 10:39 AM MRN: [id number] Endoscopist: Remo Lipps P. Havery Moros , MD Age: 53 Referring MD:  Date of Birth: [birth year] Gender: Female Account #: [id number] Procedure:                Flexible Sigmoidoscopy Indications:              Follow-up for of distal rectum / anal canal                            polypoid lesion Medicines:                Monitored Anesthesia Care Procedure:                Pre-Anesthesia Assessment:                           - Prior to the procedure, a History and Physical                            was performed, and patient medications and                            allergies were reviewed. The patient's tolerance of                            previous anesthesia was also reviewed. The risks                            and benefits of the procedure and the sedation                            options and risks were discussed with the patient.                            All questions were answered, and informed consent                            was obtained. Prior Anticoagulants: The patient has                            taken no previous anticoagulant or antiplatelet                            agents. ASA Grade Assessment: II - A patient with                            mild systemic disease. After reviewing the risks                            and benefits, the patient was deemed in                            satisfactory condition to undergo the procedure.  After obtaining informed consent, the scope was                            passed under direct vision. The Colonoscope was                            introduced through the anus and advanced to the the                            sigmoid colon. The flexible sigmoidoscopy was                            accomplished without difficulty. The patient                            tolerated the procedure well. The quality of  the                            bowel preparation was fair. Scope In: 10:44:21 AM Scope Out: 10:49:16 AM Total Procedure Duration: 0 hours 4 minutes 54 seconds  Findings:                 One large skin tag was found on perianal exam.                           A moderate amount of semi-liquid stool was found in                            the rectum, in the recto-sigmoid colon and in the                            sigmoid colon, making visualization difficult in                            the rectosigmoid and sigmoid colon.                           2 suspected hypertrophied anal papillae versus                            polypoid lesions were found in the distal rectum /                            approximating the dentate line. The lesions were                            semi-pedunculated and appeared stable in size since                            the last exam. The mucosa at the base of one of                            these lesions was slightly altered, I suspect  normal variant. Biopsies were taken with a cold                            forceps for histology of the polypoid lesions to                            ensure no dysplastic change as well as the altered                            mucosa at the base of the lesion.                           The exam was otherwise without abnormality. Complications:            No immediate complications. Estimated blood loss:                            Minimal. Estimated Blood Loss:     Estimated blood loss was minimal. Impression:               - Perianal skin tag found on perianal exam.                           - Suspect hypertrophied anal papillae versus                            polypoid lesions in the distal rectum / dentate                            line. Stable in size from the last exam. Biopsied.                           - The examination was otherwise normal. Recommendation:           - Discharge patient  to home.                           - Resume previous diet.                           - Continue present medications.                           - Await pathology results                           - Consideration for surgical evaluation if                            definitive removal is desired Remo Lipps P. Havery Moros, MD 05/14/2018 10:57:32 AM This report has been signed electronically.

## 2018-05-14 NOTE — Patient Instructions (Signed)
Thank you for allowing [place] to care for you today.  Await pathology results by mail, approximately 2 weeks.  Resume previous diet and medications today,.  Return to normal activities tomorrow.       YOU HAD AN ENDOSCOPIC PROCEDURE TODAY AT [facility]:   Refer to the procedure report that was given to you for any specific questions about what was found during the examination.  If the procedure report does not answer your questions, please call your gastroenterologist to clarify.  If you requested that your care partner not be given the details of your procedure findings, then the procedure report has been included in a sealed envelope for you to review at your convenience later.  YOU SHOULD EXPECT: Some feelings of bloating in the abdomen. Passage of more gas than usual.  Walking can help get rid of the air that was put into your GI tract during the procedure and reduce the bloating. If you had a lower endoscopy (such as a colonoscopy or flexible sigmoidoscopy) you may notice spotting of blood in your stool or on the toilet paper. If you underwent a bowel prep for your procedure, you may not have a normal bowel movement for a few days.  Please Note:  You might notice some irritation and congestion in your nose or some drainage.  This is from the oxygen used during your procedure.  There is no need for concern and it should clear up in a day or so.  SYMPTOMS TO REPORT IMMEDIATELY:   Following lower endoscopy (colonoscopy or flexible sigmoidoscopy):  Excessive amounts of blood in the stool  Significant tenderness or worsening of abdominal pains  Swelling of the abdomen that is new, acute  Fever of 100F or higher    For urgent or emergent issues, a gastroenterologist can be reached at any hour by calling [phone].   DIET:  We do recommend a small meal at first, but then you may proceed to your regular diet.  Drink plenty of fluids but you should avoid alcoholic  beverages for 24 hours.  ACTIVITY:  You should plan to take it easy for the rest of today and you should NOT DRIVE or use heavy machinery until tomorrow (because of the sedation medicines used during the test).    FOLLOW UP: Our staff will call the number listed on your records the next business day following your procedure to check on you and address any questions or concerns that you may have regarding the information given to you following your procedure. If we do not reach you, we will leave a message.  However, if you are feeling well and you are not experiencing any problems, there is no need to return our call.  We will assume that you have returned to your regular daily activities without incident.  If any biopsies were taken you will be contacted by phone or by letter within the next 1-3 weeks.  Please call us at [phone] if you have not heard about the biopsies in 3 weeks.    SIGNATURES/CONFIDENTIALITY: You and/or your care partner have signed paperwork which will be entered into your electronic medical record.  These signatures attest to the fact that that the information above on your After Visit Summary has been reviewed and is understood.  Full responsibility of the confidentiality of this discharge information lies with you and/or your care-partner.

## 2018-05-17 ENCOUNTER — Telehealth: Payer: Self-pay

## 2018-05-17 NOTE — Telephone Encounter (Signed)
  Follow up Call-  Call back number 05/14/2018  Post procedure Call Back phone  # [phone]  Permission to leave phone message Yes  Some recent data might be hidden     Patient questions:  Do you have a fever, pain , or abdominal swelling? No. Pain Score  0 *  Have you tolerated food without any problems? Yes.    Have you been able to return to your normal activities? Yes.    Do you have any questions about your discharge instructions: Diet   No. Medications  No. Follow up visit  No.  Do you have questions or concerns about your Care? No.  Actions: * If pain score is 4 or above: No action needed, pain <4.

## 2018-05-18 ENCOUNTER — Ambulatory Visit (INDEPENDENT_AMBULATORY_CARE_PROVIDER_SITE_OTHER): Payer: BLUE CROSS/BLUE SHIELD | Admitting: Orthopaedic Surgery

## 2018-05-18 ENCOUNTER — Encounter (INDEPENDENT_AMBULATORY_CARE_PROVIDER_SITE_OTHER): Payer: Self-pay | Admitting: Orthopaedic Surgery

## 2018-05-18 DIAGNOSIS — M7502 Adhesive capsulitis of left shoulder: Secondary | ICD-10-CM | POA: Diagnosis not present

## 2018-05-18 NOTE — Progress Notes (Signed)
Office Visit Note   Patient: Jessica Parks           Date of Birth: March 13, 1966           MRN: [id number] Visit Date: 05/18/2018              Requested by: Glendale Chard, [address] [place], [place] [address] PCP: Glendale Chard, MD   Assessment & Plan: Visit Diagnoses:  1. Adhesive capsulitis of left shoulder     Plan: Impression is left shoulder adhesive capsulitis.  The patient is failed intra-articular cortisone injections and formal physical therapy.  She has not yet had an MRI of the left shoulder, so we will obtain this prior to her follow-up appointment.  If this shows solely adhesive capsulitis, we will set her up for manipulation debriding arthroscopy.  Follow-Up Instructions: Return in about 10 days (around 05/28/2018).   Orders:  No orders of the defined types were placed in this encounter.  No orders of the defined types were placed in this encounter.     Procedures: No procedures performed   Clinical Data: No additional findings.   Subjective: Chief Complaint  Patient presents with  . Left Shoulder - Pain, Follow-up    HPI patient is a pleasant 53 year old female who presents our clinic today with continued left shoulder pain and limited range of motion.  History of adhesive capsulitis.  She has had her left shoulder joint injected twice with cortisone by Dr. Junius Roads.  She has been to over 12 weeks of formal physical therapy.  She has noticed minimal improvement of symptoms following the injection and she still has limited range of motion.  Review of Systems as detailed in HPI.  All others reviewed and are negative.   Objective: Vital Signs: LMP 05/09/2018 (Exact Date)   Physical Exam well-developed and well-nourished female in no acute distress.  Alert and oriented x3.  Ortho Exam examination of the left shoulder shows 50% active range of motion all planes.  She is neurovascular intact distally.  Specialty Comments:  No specialty  comments available.  Imaging: No new imaging   PMFS History: Patient Active Problem List   Diagnosis Date Noted  . Adhesive capsulitis of left shoulder 02/17/2018  . Left shoulder pain 10/23/2017  . Arthritis of right acromioclavicular joint 09/23/2016  . History of colonic polyps 05/06/2011  . Internal hemorrhoids 05/23/2009  . CONSTIPATION 05/23/2009   Past Medical History:  Diagnosis Date  . Anemia   . Anxiety disorder   . Arthritis   . Frozen shoulder    left- PT only 04/28/2018  . Hemorrhoids     Family History  Problem Relation Age of Onset  . Breast cancer Maternal Grandmother   . Breast cancer Maternal Aunt   . Breast cancer Maternal Aunt   . Diabetes Maternal Aunt   . Colon cancer Neg Hx   . Rectal cancer Neg Hx     Past Surgical History:  Procedure Laterality Date  . BREAST BIOPSY Left 2014   benign  . TUBAL LIGATION     Social History   Occupational History  . Occupation: unemployed    Fish farm manager: [employer]  Tobacco Use  . Smoking status: Never Smoker  . Smokeless tobacco: Never Used  Substance and Sexual Activity  . Alcohol use: Yes    Comment: socially  . Drug use: No  . Sexual activity: Yes    Partners: Male    Birth control/protection: Surgical

## 2018-05-25 ENCOUNTER — Ambulatory Visit
Admission: RE | Admit: 2018-05-25 | Discharge: 2018-05-25 | Disposition: A | Discharge status: Home / Self Care | Payer: BLUE CROSS/BLUE SHIELD | Source: Ambulatory Visit | Attending: Orthopaedic Surgery | Admitting: Orthopaedic Surgery

## 2018-05-25 DIAGNOSIS — M7502 Adhesive capsulitis of left shoulder: Secondary | ICD-10-CM

## 2018-05-27 ENCOUNTER — Telehealth: Payer: Self-pay | Admitting: Gastroenterology

## 2018-05-27 NOTE — Telephone Encounter (Signed)
Pt returning your call regarding results

## 2018-05-27 NOTE — Telephone Encounter (Signed)
See results notes for additional details. 

## 2018-05-28 ENCOUNTER — Ambulatory Visit (INDEPENDENT_AMBULATORY_CARE_PROVIDER_SITE_OTHER): Payer: BLUE CROSS/BLUE SHIELD | Admitting: Orthopaedic Surgery

## 2018-05-28 ENCOUNTER — Encounter (INDEPENDENT_AMBULATORY_CARE_PROVIDER_SITE_OTHER): Payer: Self-pay | Admitting: Orthopaedic Surgery

## 2018-05-28 VITALS — Ht 66.0 in | Wt 163.0 lb

## 2018-05-28 DIAGNOSIS — M25512 Pain in left shoulder: Secondary | ICD-10-CM

## 2018-05-28 DIAGNOSIS — G8929 Other chronic pain: Secondary | ICD-10-CM | POA: Diagnosis not present

## 2018-05-28 DIAGNOSIS — M7502 Adhesive capsulitis of left shoulder: Secondary | ICD-10-CM | POA: Diagnosis not present

## 2018-05-28 NOTE — Progress Notes (Signed)
Office Visit Note   Patient: Jessica Parks           Date of Birth: 04/03/1966           MRN: [id number] Visit Date: 05/28/2018              Requested by: Jessica Parks, Jessica Parks [address] [place], Jessica Parks [address] PCP: Jessica Chard, MD   Assessment & Plan: Visit Diagnoses:  1. Adhesive capsulitis of left shoulder   2. Chronic left shoulder pain     Plan: MRI of the left shoulder confirms adhesive capsulitis.  Her exam and history are not consistent with Parsonage-Turner syndrome.  These findings were discussed with the patient and we discussed continuing nonsurgical treatment versus manipulation and arthroscopic lysis of adhesions and their associated risks and benefits.  At this point patient can no longer bear this pain and dysfunction and would like to proceed with surgical treatment.  Rehab and recovery were also discussed today.  Questions encouraged and answered.  Follow-Up Instructions: Return for 1 week postop visit.   Orders:  No orders of the defined types were placed in this encounter.  No orders of the defined types were placed in this encounter.     Procedures: No procedures performed   Clinical Data: No additional findings.   Subjective: Chief Complaint  Patient presents with  . Left Shoulder - Follow-up    MRI review     Jessica Parks is a 53 year old female who comes in for review of her left shoulder MRI for adhesive capsulitis.  She denies any weakness just pain and stiffness.  She has done extensive physical therapy and she has noticed no improvement in her range of motion or pain.   Review of Systems   Objective: Vital Signs: Ht 5\' 6"  (1.676 m)   Wt 163 lb (73.9 kg)   LMP 05/09/2018 (Exact Date)   BMI 26.31 kg/m   Physical Exam  Ortho Exam Left shoulder exam shows marketed limitation in flexion and external rotation internal rotation.  Muscle testing is grossly normal. Specialty Comments:  No specialty comments  available.  Imaging: No results found.   PMFS History: Patient Active Problem List   Diagnosis Date Noted  . Adhesive capsulitis of left shoulder 02/17/2018  . Left shoulder pain 10/23/2017  . Arthritis of right acromioclavicular joint 09/23/2016  . History of colonic polyps 05/06/2011  . Internal hemorrhoids 05/23/2009  . CONSTIPATION 05/23/2009   Past Medical History:  Diagnosis Date  . Anemia   . Anxiety disorder   . Arthritis   . Frozen shoulder    left- PT only 04/28/2018  . Hemorrhoids     Family History  Problem Relation Age of Onset  . Breast cancer Maternal Grandmother   . Breast cancer Maternal Aunt   . Breast cancer Maternal Aunt   . Diabetes Maternal Aunt   . Colon cancer Neg Hx   . Rectal cancer Neg Hx     Past Surgical History:  Procedure Laterality Date  . BREAST BIOPSY Left 2014   benign  . TUBAL LIGATION     Social History   Occupational History  . Occupation: unemployed    Fish farm manager: [employer]  Tobacco Use  . Smoking status: Never Smoker  . Smokeless tobacco: Never Used  Substance and Sexual Activity  . Alcohol use: Yes    Comment: socially  . Drug use: No  . Sexual activity: Yes    Partners: Male    Birth  control/protection: Surgical

## 2018-06-02 ENCOUNTER — Encounter (HOSPITAL_BASED_OUTPATIENT_CLINIC_OR_DEPARTMENT_OTHER): Payer: Self-pay | Admitting: *Deleted

## 2018-06-02 ENCOUNTER — Other Ambulatory Visit: Payer: Self-pay

## 2018-06-11 ENCOUNTER — Other Ambulatory Visit: Payer: Self-pay

## 2018-06-11 ENCOUNTER — Ambulatory Visit (HOSPITAL_BASED_OUTPATIENT_CLINIC_OR_DEPARTMENT_OTHER): Payer: BLUE CROSS/BLUE SHIELD | Admitting: Certified Registered"

## 2018-06-11 ENCOUNTER — Inpatient Hospital Stay (HOSPITAL_COMMUNITY): Payer: BLUE CROSS/BLUE SHIELD

## 2018-06-11 ENCOUNTER — Inpatient Hospital Stay (HOSPITAL_COMMUNITY)
Admission: AD | Disposition: A | Discharge status: Home / Self Care | Payer: Self-pay | Source: Home / Self Care | Attending: Orthopaedic Surgery

## 2018-06-11 ENCOUNTER — Inpatient Hospital Stay (HOSPITAL_BASED_OUTPATIENT_CLINIC_OR_DEPARTMENT_OTHER)
Admission: AD | Admit: 2018-06-11 | Discharge: 2018-06-14 | DRG: 907 | Disposition: A | Discharge status: Home / Self Care | Payer: BLUE CROSS/BLUE SHIELD | Attending: Orthopaedic Surgery | Admitting: Orthopaedic Surgery

## 2018-06-11 ENCOUNTER — Encounter: Payer: Self-pay | Admitting: Orthopaedic Surgery

## 2018-06-11 ENCOUNTER — Encounter (HOSPITAL_BASED_OUTPATIENT_CLINIC_OR_DEPARTMENT_OTHER): Payer: Self-pay | Admitting: *Deleted

## 2018-06-11 DIAGNOSIS — E876 Hypokalemia: Secondary | ICD-10-CM | POA: Diagnosis present

## 2018-06-11 DIAGNOSIS — R202 Paresthesia of skin: Secondary | ICD-10-CM | POA: Diagnosis present

## 2018-06-11 DIAGNOSIS — I959 Hypotension, unspecified: Secondary | ICD-10-CM | POA: Diagnosis present

## 2018-06-11 DIAGNOSIS — Z3682 Encounter for antenatal screening for nuchal translucency: Secondary | ICD-10-CM

## 2018-06-11 DIAGNOSIS — R000 Tachycardia, unspecified: Secondary | ICD-10-CM | POA: Diagnosis present

## 2018-06-11 DIAGNOSIS — R4182 Altered mental status, unspecified: Secondary | ICD-10-CM

## 2018-06-11 DIAGNOSIS — F419 Anxiety disorder, unspecified: Secondary | ICD-10-CM | POA: Diagnosis present

## 2018-06-11 DIAGNOSIS — I429 Cardiomyopathy, unspecified: Secondary | ICD-10-CM | POA: Diagnosis not present

## 2018-06-11 DIAGNOSIS — I21A1 Myocardial infarction type 2: Secondary | ICD-10-CM | POA: Diagnosis present

## 2018-06-11 DIAGNOSIS — I214 Non-ST elevation (NSTEMI) myocardial infarction: Secondary | ICD-10-CM | POA: Diagnosis not present

## 2018-06-11 DIAGNOSIS — T445X1A Poisoning by predominantly beta-adrenoreceptor agonists, accidental (unintentional), initial encounter: Secondary | ICD-10-CM | POA: Diagnosis present

## 2018-06-11 DIAGNOSIS — I5021 Acute systolic (congestive) heart failure: Secondary | ICD-10-CM | POA: Diagnosis not present

## 2018-06-11 DIAGNOSIS — Y92234 Operating room of hospital as the place of occurrence of the external cause: Secondary | ICD-10-CM | POA: Diagnosis present

## 2018-06-11 DIAGNOSIS — I5181 Takotsubo syndrome: Secondary | ICD-10-CM | POA: Diagnosis present

## 2018-06-11 DIAGNOSIS — M24512 Contracture, left shoulder: Secondary | ICD-10-CM | POA: Diagnosis present

## 2018-06-11 DIAGNOSIS — Z803 Family history of malignant neoplasm of breast: Secondary | ICD-10-CM

## 2018-06-11 DIAGNOSIS — I161 Hypertensive emergency: Secondary | ICD-10-CM | POA: Diagnosis present

## 2018-06-11 DIAGNOSIS — I10 Essential (primary) hypertension: Secondary | ICD-10-CM | POA: Diagnosis present

## 2018-06-11 DIAGNOSIS — Z833 Family history of diabetes mellitus: Secondary | ICD-10-CM

## 2018-06-11 DIAGNOSIS — E872 Acidosis: Secondary | ICD-10-CM | POA: Diagnosis present

## 2018-06-11 DIAGNOSIS — I2510 Atherosclerotic heart disease of native coronary artery without angina pectoris: Secondary | ICD-10-CM

## 2018-06-11 DIAGNOSIS — M7502 Adhesive capsulitis of left shoulder: Secondary | ICD-10-CM | POA: Diagnosis not present

## 2018-06-11 DIAGNOSIS — Z539 Procedure and treatment not carried out, unspecified reason: Secondary | ICD-10-CM | POA: Diagnosis present

## 2018-06-11 DIAGNOSIS — J9601 Acute respiratory failure with hypoxia: Secondary | ICD-10-CM | POA: Diagnosis present

## 2018-06-11 DIAGNOSIS — Z01818 Encounter for other preprocedural examination: Secondary | ICD-10-CM

## 2018-06-11 DIAGNOSIS — D62 Acute posthemorrhagic anemia: Secondary | ICD-10-CM | POA: Diagnosis not present

## 2018-06-11 DIAGNOSIS — I248 Other forms of acute ischemic heart disease: Secondary | ICD-10-CM | POA: Diagnosis not present

## 2018-06-11 DIAGNOSIS — R0689 Other abnormalities of breathing: Secondary | ICD-10-CM

## 2018-06-11 DIAGNOSIS — J9690 Respiratory failure, unspecified, unspecified whether with hypoxia or hypercapnia: Secondary | ICD-10-CM | POA: Diagnosis present

## 2018-06-11 DIAGNOSIS — J811 Chronic pulmonary edema: Secondary | ICD-10-CM | POA: Diagnosis present

## 2018-06-11 DIAGNOSIS — R739 Hyperglycemia, unspecified: Secondary | ICD-10-CM | POA: Diagnosis present

## 2018-06-11 DIAGNOSIS — R7989 Other specified abnormal findings of blood chemistry: Secondary | ICD-10-CM | POA: Diagnosis not present

## 2018-06-11 HISTORY — PX: SHOULDER ARTHROSCOPY WITH SUBACROMIAL DECOMPRESSION: SHX5684

## 2018-06-11 LAB — CK TOTAL AND CKMB (NOT AT ARMC)
CK, MB: 3.2 ng/mL (ref 0.5–5.0)
RELATIVE INDEX: INVALID (ref 0.0–2.5)
Total CK: 84 U/L (ref 38–234)

## 2018-06-11 LAB — POCT I-STAT 7, (LYTES, BLD GAS, ICA,H+H)
Acid-base deficit: 1 mmol/L (ref 0.0–2.0)
Bicarbonate: 25.2 mmol/L (ref 20.0–28.0)
Calcium, Ion: 1.06 mmol/L — ABNORMAL LOW (ref 1.15–1.40)
HCT: 32 % — ABNORMAL LOW (ref 36.0–46.0)
Hemoglobin: 10.9 g/dL — ABNORMAL LOW (ref 12.0–15.0)
O2 Saturation: 100 %
Patient temperature: 97.79
Potassium: 3.1 mmol/L — ABNORMAL LOW (ref 3.5–5.1)
Sodium: 140 mmol/L (ref 135–145)
TCO2: 27 mmol/L (ref 22–32)
pCO2 arterial: 44.5 mmHg (ref 32.0–48.0)
pH, Arterial: 7.359 (ref 7.350–7.450)
pO2, Arterial: 234 mmHg — ABNORMAL HIGH (ref 83.0–108.0)

## 2018-06-11 LAB — BLOOD GAS, ARTERIAL
Acid-base deficit: 10.9 mmol/L — ABNORMAL HIGH (ref 0.0–2.0)
Bicarbonate: 16.8 mmol/L — ABNORMAL LOW (ref 20.0–28.0)
Drawn by: 205171
FIO2: 100
MECHVT: 450 mL
O2 Saturation: 90.9 %
Patient temperature: 98.6
RATE: 11 resp/min
pCO2 arterial: 53.9 mmHg — ABNORMAL HIGH (ref 32.0–48.0)
pH, Arterial: 7.122 — CL (ref 7.350–7.450)
pO2, Arterial: 86.4 mmHg (ref 83.0–108.0)

## 2018-06-11 LAB — MRSA PCR SCREENING: MRSA by PCR: NEGATIVE

## 2018-06-11 LAB — COMPREHENSIVE METABOLIC PANEL
ALT: 13 U/L (ref 0–44)
AST: 23 U/L (ref 15–41)
Albumin: 2.5 g/dL — ABNORMAL LOW (ref 3.5–5.0)
Alkaline Phosphatase: 40 U/L (ref 38–126)
Anion gap: 15 (ref 5–15)
BUN: 8 mg/dL (ref 6–20)
CO2: 22 mmol/L (ref 22–32)
Calcium: 7.6 mg/dL — ABNORMAL LOW (ref 8.9–10.3)
Chloride: 105 mmol/L (ref 98–111)
Creatinine, Ser: 1.13 mg/dL — ABNORMAL HIGH (ref 0.44–1.00)
GFR calc Af Amer: >60 mL/min (ref >60)
GFR calc non Af Amer: 56 mL/min — ABNORMAL LOW (ref >60)
Glucose, Bld: 336 mg/dL — ABNORMAL HIGH (ref 70–99)
POTASSIUM: 2.4 mmol/L — AB (ref 3.5–5.1)
Sodium: 142 mmol/L (ref 135–145)
Total Bilirubin: 0.3 mg/dL (ref 0.3–1.2)
Total Protein: 4.9 g/dL — ABNORMAL LOW (ref 6.5–8.1)

## 2018-06-11 LAB — CBC WITH DIFFERENTIAL/PLATELET
Abs Immature Granulocytes: 0 10*3/uL (ref 0.00–0.07)
Basophils Absolute: 0.1 10*3/uL (ref 0.0–0.1)
Basophils Relative: 1 %
Eosinophils Absolute: 0.2 10*3/uL (ref 0.0–0.5)
Eosinophils Relative: 2 %
HCT: 32 % — ABNORMAL LOW (ref 36.0–46.0)
Hemoglobin: 9.7 g/dL — ABNORMAL LOW (ref 12.0–15.0)
Lymphocytes Relative: 39 %
Lymphs Abs: 3.2 10*3/uL (ref 0.7–4.0)
MCH: 26.5 pg (ref 26.0–34.0)
MCHC: 30.3 g/dL (ref 30.0–36.0)
MCV: 87.4 fL (ref 80.0–100.0)
Monocytes Absolute: 0 10*3/uL — ABNORMAL LOW (ref 0.1–1.0)
Monocytes Relative: 0 %
NEUTROS PCT: 58 %
NRBC: 1 /100{WBCs} — AB
Neutro Abs: 4.7 10*3/uL (ref 1.7–7.7)
Platelets: 194 10*3/uL (ref 150–400)
RBC: 3.66 MIL/uL — ABNORMAL LOW (ref 3.87–5.11)
RDW: 11.8 % (ref 11.5–15.5)
WBC: 8.1 10*3/uL (ref 4.0–10.5)
nRBC: 0 % (ref 0.0–0.2)

## 2018-06-11 LAB — TROPONIN I
Troponin I: 0.47 ng/mL (ref <0.03)
Troponin I: 1.58 ng/mL (ref <0.03)
Troponin I: 2.93 ng/mL (ref <0.03)

## 2018-06-11 LAB — HEMOGLOBIN AND HEMATOCRIT, BLOOD
HCT: 35.8 % — ABNORMAL LOW (ref 36.0–46.0)
Hemoglobin: 10.9 g/dL — ABNORMAL LOW (ref 12.0–15.0)

## 2018-06-11 LAB — CBC
HCT: 36.1 % (ref 36.0–46.0)
Hemoglobin: 11.1 g/dL — ABNORMAL LOW (ref 12.0–15.0)
MCH: 27.4 pg (ref 26.0–34.0)
MCHC: 30.7 g/dL (ref 30.0–36.0)
MCV: 89.1 fL (ref 80.0–100.0)
Platelets: 231 10*3/uL (ref 150–400)
RBC: 4.05 MIL/uL (ref 3.87–5.11)
RDW: 11.9 % (ref 11.5–15.5)
WBC: 16.9 10*3/uL — ABNORMAL HIGH (ref 4.0–10.5)
nRBC: 0 % (ref 0.0–0.2)

## 2018-06-11 LAB — CREATININE, SERUM
Creatinine, Ser: 1.13 mg/dL — ABNORMAL HIGH (ref 0.44–1.00)
GFR calc Af Amer: >60 mL/min (ref >60)
GFR calc non Af Amer: 56 mL/min — ABNORMAL LOW (ref >60)

## 2018-06-11 LAB — PROTIME-INR
INR: 1.2 (ref 0.8–1.2)
PROTHROMBIN TIME: 15.2 s (ref 11.4–15.2)

## 2018-06-11 LAB — GLUCOSE, CAPILLARY
Glucose-Capillary: 256 mg/dL — ABNORMAL HIGH (ref 70–99)
Glucose-Capillary: 257 mg/dL — ABNORMAL HIGH (ref 70–99)

## 2018-06-11 LAB — APTT: aPTT: 31 seconds (ref 24–36)

## 2018-06-11 LAB — MAGNESIUM: Magnesium: 1.7 mg/dL (ref 1.7–2.4)

## 2018-06-11 SURGERY — SHOULDER ARTHROSCOPY WITH SUBACROMIAL DECOMPRESSION
Anesthesia: General | Site: Shoulder | Laterality: Left

## 2018-06-11 MED ORDER — ROCURONIUM BROMIDE 100 MG/10ML IV SOLN
INTRAVENOUS | Status: DC | PRN
  Administered 2018-06-11 (×4): 50 mg via INTRAVENOUS

## 2018-06-11 MED ORDER — SODIUM BICARBONATE 8.4 % IV SOLN
INTRAVENOUS | Status: AC
  Filled 2018-06-11: qty 50

## 2018-06-11 MED ORDER — ORAL CARE MOUTH RINSE: 15.0000 mL | OROMUCOSAL | Status: DC

## 2018-06-11 MED ORDER — MIDAZOLAM HCL 2 MG/2ML IJ SOLN: 2.0000 mg | INTRAMUSCULAR | Status: DC | PRN

## 2018-06-11 MED ORDER — ENOXAPARIN SODIUM 40 MG/0.4ML ~~LOC~~ SOLN
40.0000 mg | SUBCUTANEOUS | Status: DC
  Administered 2018-06-12 – 2018-06-13 (×2): 40 mg via SUBCUTANEOUS
  Filled 2018-06-11 (×2): qty 0.4

## 2018-06-11 MED ORDER — PROPOFOL 10 MG/ML IV BOLUS
INTRAVENOUS | Status: DC | PRN
  Administered 2018-06-11: 180 mg via INTRAVENOUS

## 2018-06-11 MED ORDER — METOCLOPRAMIDE HCL 5 MG/ML IJ SOLN: 10.0000 mg | Freq: Once | INTRAMUSCULAR | Status: DC | PRN

## 2018-06-11 MED ORDER — FENTANYL CITRATE (PF) 100 MCG/2ML IJ SOLN: 25.0000 ug | INTRAMUSCULAR | Status: DC | PRN

## 2018-06-11 MED ORDER — ESMOLOL HCL 100 MG/10ML IV SOLN
INTRAVENOUS | Status: AC
  Filled 2018-06-11: qty 20

## 2018-06-11 MED ORDER — MIDAZOLAM HCL 2 MG/2ML IJ SOLN
INTRAMUSCULAR | Status: AC
  Filled 2018-06-11: qty 4

## 2018-06-11 MED ORDER — SODIUM BICARBONATE 8.4 % IV SOLN
INTRAVENOUS | Status: DC | PRN
  Administered 2018-06-11 (×2): 50 meq via INTRAVENOUS

## 2018-06-11 MED ORDER — POTASSIUM CHLORIDE 10 MEQ/50ML IV SOLN
10.0000 meq | INTRAVENOUS | Status: AC
  Administered 2018-06-11 – 2018-06-12 (×6): 10 meq via INTRAVENOUS
  Filled 2018-06-11 (×7): qty 50

## 2018-06-11 MED ORDER — DEXAMETHASONE SODIUM PHOSPHATE 10 MG/ML IJ SOLN
INTRAMUSCULAR | Status: DC | PRN
  Administered 2018-06-11: 10 mg via INTRAVENOUS

## 2018-06-11 MED ORDER — LIDOCAINE 2% (20 MG/ML) 5 ML SYRINGE
INTRAMUSCULAR | Status: AC
  Filled 2018-06-11: qty 5

## 2018-06-11 MED ORDER — PROPOFOL 10 MG/ML IV BOLUS
INTRAVENOUS | Status: AC
  Filled 2018-06-11: qty 20

## 2018-06-11 MED ORDER — CHLORHEXIDINE GLUCONATE 4 % EX LIQD: 60.0000 mL | Freq: Once | CUTANEOUS | Status: DC

## 2018-06-11 MED ORDER — SODIUM CHLORIDE 0.9 % IV SOLN
INTRAVENOUS | Status: DC
  Administered 2018-06-11 – 2018-06-13 (×4): via INTRAVENOUS

## 2018-06-11 MED ORDER — PROMETHAZINE HCL 25 MG PO TABS: 25.0000 mg | ORAL_TABLET | Freq: Four times a day (QID) | ORAL | 1 refills | Status: DC | PRN

## 2018-06-11 MED ORDER — SCOPOLAMINE 1 MG/3DAYS TD PT72: 1.0000 | MEDICATED_PATCH | Freq: Once | TRANSDERMAL | Status: DC | PRN

## 2018-06-11 MED ORDER — ONDANSETRON HCL 4 MG PO TABS: 4.0000 mg | ORAL_TABLET | Freq: Three times a day (TID) | ORAL | 0 refills | Status: DC | PRN

## 2018-06-11 MED ORDER — SUGAMMADEX SODIUM 200 MG/2ML IV SOLN
INTRAVENOUS | Status: AC
  Filled 2018-06-11: qty 2

## 2018-06-11 MED ORDER — PANTOPRAZOLE SODIUM 40 MG IV SOLR
40.0000 mg | INTRAVENOUS | Status: DC
  Administered 2018-06-11: 40 mg via INTRAVENOUS
  Filled 2018-06-11: qty 40

## 2018-06-11 MED ORDER — LACTATED RINGERS IV SOLN
INTRAVENOUS | Status: DC
  Administered 2018-06-11: 14:00:00 via INTRAVENOUS

## 2018-06-11 MED ORDER — BUPIVACAINE LIPOSOME 1.3 % IJ SUSP
INTRAMUSCULAR | Status: DC | PRN
  Administered 2018-06-11: 10 mL via PERINEURAL

## 2018-06-11 MED ORDER — CEFAZOLIN SODIUM-DEXTROSE 2-4 GM/100ML-% IV SOLN
2.0000 g | INTRAVENOUS | Status: AC
  Administered 2018-06-11: 2 g via INTRAVENOUS

## 2018-06-11 MED ORDER — SODIUM CHLORIDE 0.9% FLUSH
10.0000 mL | Freq: Two times a day (BID) | INTRAVENOUS | Status: DC
  Administered 2018-06-12: 30 mL
  Administered 2018-06-12: 20 mL

## 2018-06-11 MED ORDER — INSULIN ASPART 100 UNIT/ML ~~LOC~~ SOLN
0.0000 [IU] | SUBCUTANEOUS | Status: DC
  Administered 2018-06-11 (×2): 5 [IU] via SUBCUTANEOUS
  Administered 2018-06-12 (×2): 1 [IU] via SUBCUTANEOUS

## 2018-06-11 MED ORDER — ORAL CARE MOUTH RINSE
15.0000 mL | Freq: Two times a day (BID) | OROMUCOSAL | Status: DC
  Administered 2018-06-11 – 2018-06-13 (×4): 15 mL via OROMUCOSAL

## 2018-06-11 MED ORDER — CEFAZOLIN SODIUM-DEXTROSE 2-4 GM/100ML-% IV SOLN
INTRAVENOUS | Status: AC
  Filled 2018-06-11: qty 100

## 2018-06-11 MED ORDER — FENTANYL CITRATE (PF) 100 MCG/2ML IJ SOLN
INTRAMUSCULAR | Status: AC
  Filled 2018-06-11: qty 2

## 2018-06-11 MED ORDER — DOCUSATE SODIUM 50 MG/5ML PO LIQD: 100.0000 mg | Freq: Two times a day (BID) | ORAL | Status: DC | PRN

## 2018-06-11 MED ORDER — MIDAZOLAM HCL 2 MG/2ML IJ SOLN
INTRAMUSCULAR | Status: AC
  Filled 2018-06-11: qty 2

## 2018-06-11 MED ORDER — METHOCARBAMOL 750 MG PO TABS: 750.0000 mg | ORAL_TABLET | Freq: Two times a day (BID) | ORAL | 0 refills | Status: DC | PRN

## 2018-06-11 MED ORDER — CHLORHEXIDINE GLUCONATE 0.12% ORAL RINSE (MEDLINE KIT)
15.0000 mL | Freq: Two times a day (BID) | OROMUCOSAL | Status: DC
  Administered 2018-06-11: 15 mL via OROMUCOSAL

## 2018-06-11 MED ORDER — ESMOLOL HCL 100 MG/10ML IV SOLN
INTRAVENOUS | Status: DC | PRN
  Administered 2018-06-11: 30 mg via INTRAVENOUS
  Administered 2018-06-11 (×2): 20 mg via INTRAVENOUS
  Administered 2018-06-11: 30 mg via INTRAVENOUS
  Administered 2018-06-11: 20 mg via INTRAVENOUS
  Administered 2018-06-11: 30 mg via INTRAVENOUS
  Administered 2018-06-11 (×3): 20 mg via INTRAVENOUS
  Administered 2018-06-11: 50 mg via INTRAVENOUS
  Administered 2018-06-11: 30 mg via INTRAVENOUS
  Administered 2018-06-11: 20 mg via INTRAVENOUS
  Administered 2018-06-11: 30 mg via INTRAVENOUS

## 2018-06-11 MED ORDER — MEPERIDINE HCL 25 MG/ML IJ SOLN: 6.2500 mg | INTRAMUSCULAR | Status: DC | PRN

## 2018-06-11 MED ORDER — FENTANYL CITRATE (PF) 100 MCG/2ML IJ SOLN: 100.0000 ug | INTRAMUSCULAR | Status: DC | PRN

## 2018-06-11 MED ORDER — MIDAZOLAM HCL 2 MG/2ML IJ SOLN
1.0000 mg | INTRAMUSCULAR | Status: DC | PRN
  Administered 2018-06-11: 2 mg via INTRAVENOUS

## 2018-06-11 MED ORDER — ONDANSETRON HCL 4 MG/2ML IJ SOLN
INTRAMUSCULAR | Status: AC
  Filled 2018-06-11: qty 2

## 2018-06-11 MED ORDER — ENOXAPARIN SODIUM 40 MG/0.4ML ~~LOC~~ SOLN: 40.0000 mg | SUBCUTANEOUS | Status: DC

## 2018-06-11 MED ORDER — LABETALOL HCL 5 MG/ML IV SOLN
INTRAVENOUS | Status: AC
  Filled 2018-06-11: qty 4

## 2018-06-11 MED ORDER — POTASSIUM CHLORIDE 10 MEQ/50ML IV SOLN: 10.0000 meq | INTRAVENOUS | Status: DC

## 2018-06-11 MED ORDER — FENTANYL CITRATE (PF) 100 MCG/2ML IJ SOLN
100.0000 ug | INTRAMUSCULAR | Status: DC | PRN
  Administered 2018-06-11: 25 ug via INTRAVENOUS
  Filled 2018-06-11: qty 2

## 2018-06-11 MED ORDER — LACTATED RINGERS IV SOLN
INTRAVENOUS | Status: DC
  Administered 2018-06-11 (×2): via INTRAVENOUS

## 2018-06-11 MED ORDER — SODIUM CHLORIDE 0.9% FLUSH: 10.0000 mL | INTRAVENOUS | Status: DC | PRN

## 2018-06-11 MED ORDER — OXYCODONE-ACETAMINOPHEN 5-325 MG PO TABS: 0.5000 | ORAL_TABLET | Freq: Four times a day (QID) | ORAL | 0 refills | Status: DC | PRN

## 2018-06-11 MED ORDER — POTASSIUM CHLORIDE 20 MEQ/15ML (10%) PO SOLN: 40.0000 meq | Freq: Two times a day (BID) | ORAL | Status: DC

## 2018-06-11 MED ORDER — ONDANSETRON HCL 4 MG/2ML IJ SOLN
INTRAMUSCULAR | Status: DC | PRN
  Administered 2018-06-11: 4 mg via INTRAVENOUS

## 2018-06-11 MED ORDER — FENTANYL CITRATE (PF) 100 MCG/2ML IJ SOLN
50.0000 ug | INTRAMUSCULAR | Status: DC | PRN
  Administered 2018-06-11: 100 ug via INTRAVENOUS

## 2018-06-11 MED ORDER — LIDOCAINE 2% (20 MG/ML) 5 ML SYRINGE
INTRAMUSCULAR | Status: DC | PRN
  Administered 2018-06-11: 80 mg via INTRAVENOUS

## 2018-06-11 MED ORDER — CHLORHEXIDINE GLUCONATE CLOTH 2 % EX PADS
6.0000 | MEDICATED_PAD | Freq: Every day | CUTANEOUS | Status: DC
  Administered 2018-06-12: 6 via TOPICAL

## 2018-06-11 MED ORDER — BUPIVACAINE HCL (PF) 0.5 % IJ SOLN
INTRAMUSCULAR | Status: DC | PRN
  Administered 2018-06-11: 15 mL via PERINEURAL

## 2018-06-11 MED ORDER — LACTATED RINGERS IV SOLN: INTRAVENOUS | Status: DC

## 2018-06-11 MED ORDER — PHENYLEPHRINE 40 MCG/ML (10ML) SYRINGE FOR IV PUSH (FOR BLOOD PRESSURE SUPPORT)
PREFILLED_SYRINGE | INTRAVENOUS | Status: DC | PRN
  Administered 2018-06-11 (×5): 80 ug via INTRAVENOUS

## 2018-06-11 MED ORDER — ROCURONIUM BROMIDE 50 MG/5ML IV SOSY
PREFILLED_SYRINGE | INTRAVENOUS | Status: AC
  Filled 2018-06-11: qty 5

## 2018-06-11 MED ORDER — CHLORHEXIDINE GLUCONATE 0.12% ORAL RINSE (MEDLINE KIT): 15.0000 mL | Freq: Two times a day (BID) | OROMUCOSAL | Status: DC

## 2018-06-11 MED ORDER — FAMOTIDINE 40 MG/5ML PO SUSR: 20.0000 mg | Freq: Two times a day (BID) | ORAL | Status: DC

## 2018-06-11 MED ORDER — DEXAMETHASONE SODIUM PHOSPHATE 10 MG/ML IJ SOLN
INTRAMUSCULAR | Status: AC
  Filled 2018-06-11: qty 1

## 2018-06-11 MED ORDER — FENTANYL CITRATE (PF) 100 MCG/2ML IJ SOLN
INTRAMUSCULAR | Status: DC | PRN
  Administered 2018-06-11 (×2): 50 ug via INTRAVENOUS

## 2018-06-11 MED ORDER — SODIUM CHLORIDE 0.9 % IR SOLN
Status: DC | PRN
  Administered 2018-06-11: 3000 mL

## 2018-06-11 MED ORDER — SODIUM CHLORIDE 0.9 % IV BOLUS
500.0000 mL | Freq: Once | INTRAVENOUS | Status: AC
  Administered 2018-06-11: 500 mL via INTRAVENOUS

## 2018-06-11 MED ORDER — SODIUM BICARBONATE 4.2 % IV SOLN
INTRAVENOUS | Status: AC
  Filled 2018-06-11: qty 10

## 2018-06-11 MED ORDER — PHENYLEPHRINE HCL-NACL 10-0.9 MG/250ML-% IV SOLN: 0.0000 ug/min | INTRAVENOUS | Status: DC

## 2018-06-11 MED ORDER — MIDAZOLAM HCL 5 MG/5ML IJ SOLN
INTRAMUSCULAR | Status: DC | PRN
  Administered 2018-06-11: 4 mg via INTRAVENOUS

## 2018-06-11 MED ORDER — EPINEPHRINE PF 1 MG/ML IJ SOLN
INTRAMUSCULAR | Status: DC | PRN
  Administered 2018-06-11: 30 mg

## 2018-06-11 SURGICAL SUPPLY — 68 items
APL SKNCLS STERI-STRIP NONHPOA (GAUZE/BANDAGES/DRESSINGS)
BENZOIN TINCTURE PRP APPL 2/3 (GAUZE/BANDAGES/DRESSINGS) IMPLANT
BLADE 4.2CUDA (BLADE) ×3 IMPLANT
BLADE CUTTER GATOR 3.5 (BLADE) IMPLANT
BLADE GREAT WHITE 4.2 (BLADE) IMPLANT
BLADE GREAT WHITE 4.2MM (BLADE)
BLADE SURG 15 STRL LF DISP TIS (BLADE) IMPLANT
BLADE SURG 15 STRL SS (BLADE)
BUR OVAL 4.0 (BURR) ×3 IMPLANT
CANNULA 5.75X71 LONG (CANNULA) ×3 IMPLANT
CANNULA SHOULDER 7CM (CANNULA) ×3 IMPLANT
CANNULA TWIST IN 8.25X7CM (CANNULA) IMPLANT
CANNULA TWIST IN 8.25X9CM (CANNULA) IMPLANT
CLOSURE STERI-STRIP 1/2X4 (GAUZE/BANDAGES/DRESSINGS)
CLSR STERI-STRIP ANTIMIC 1/2X4 (GAUZE/BANDAGES/DRESSINGS) IMPLANT
COVER WAND RF STERILE (DRAPES) IMPLANT
DECANTER SPIKE VIAL GLASS SM (MISCELLANEOUS) IMPLANT
DRAPE IMP U-DRAPE 54X76 (DRAPES) ×5 IMPLANT
DRAPE INCISE IOBAN 66X45 STRL (DRAPES) ×5 IMPLANT
DRAPE STERI 35X30 U-POUCH (DRAPES) ×3 IMPLANT
DRAPE SURG 17X23 STRL (DRAPES) ×3 IMPLANT
DRAPE U-SHAPE 47X51 STRL (DRAPES) ×3 IMPLANT
DRAPE U-SHAPE 76X120 STRL (DRAPES) ×6 IMPLANT
DRSG PAD ABDOMINAL 8X10 ST (GAUZE/BANDAGES/DRESSINGS) ×6 IMPLANT
DURAPREP 26ML APPLICATOR (WOUND CARE) ×6 IMPLANT
ELECT REM PT RETURN 9FT ADLT (ELECTROSURGICAL)
ELECTRODE REM PT RTRN 9FT ADLT (ELECTROSURGICAL) IMPLANT
GAUZE SPONGE 4X4 12PLY STRL (GAUZE/BANDAGES/DRESSINGS) ×6 IMPLANT
GAUZE XEROFORM 1X8 LF (GAUZE/BANDAGES/DRESSINGS) ×3 IMPLANT
GLOVE BIOGEL PI IND STRL 7.0 (GLOVE) ×1 IMPLANT
GLOVE BIOGEL PI INDICATOR 7.0 (GLOVE) ×2
GLOVE ECLIPSE 7.0 STRL STRAW (GLOVE) ×3 IMPLANT
GLOVE SKINSENSE NS SZ7.5 (GLOVE) ×2
GLOVE SKINSENSE STRL SZ7.5 (GLOVE) ×1 IMPLANT
GLOVE SURG SYN 7.5  E (GLOVE) ×2
GLOVE SURG SYN 7.5 E (GLOVE) ×1 IMPLANT
GLOVE SURG SYN 7.5 PF PI (GLOVE) ×1 IMPLANT
GOWN STRL REIN XL XLG (GOWN DISPOSABLE) ×3 IMPLANT
GOWN STRL REUS W/ TWL LRG LVL3 (GOWN DISPOSABLE) ×1 IMPLANT
GOWN STRL REUS W/ TWL XL LVL3 (GOWN DISPOSABLE) ×1 IMPLANT
GOWN STRL REUS W/TWL LRG LVL3 (GOWN DISPOSABLE)
GOWN STRL REUS W/TWL XL LVL3 (GOWN DISPOSABLE) ×6
IMMOBILIZER SHOULDER FOAM XLGE (SOFTGOODS) IMPLANT
KIT SHOULDER TRACTION (DRAPES) ×3 IMPLANT
MANIFOLD NEPTUNE II (INSTRUMENTS) ×3 IMPLANT
NDL SCORPION MULTI FIRE (NEEDLE) IMPLANT
NEEDLE SCORPION MULTI FIRE (NEEDLE) IMPLANT
PACK ARTHROSCOPY DSU (CUSTOM PROCEDURE TRAY) ×3 IMPLANT
PACK BASIN DAY SURGERY FS (CUSTOM PROCEDURE TRAY) ×3 IMPLANT
PROBE BIPOLAR ATHRO 135MM 90D (MISCELLANEOUS) ×3 IMPLANT
SHEET MEDIUM DRAPE 40X70 STRL (DRAPES) ×3 IMPLANT
SLEEVE SCD COMPRESS KNEE MED (MISCELLANEOUS) ×3 IMPLANT
SLING ARM FOAM STRAP LRG (SOFTGOODS) ×2 IMPLANT
SLING ARM IMMOBILIZER LRG (SOFTGOODS) IMPLANT
SLING ARM IMMOBILIZER MED (SOFTGOODS) IMPLANT
SLING ARM MED ADULT FOAM STRAP (SOFTGOODS) IMPLANT
SLING ARM XL FOAM STRAP (SOFTGOODS) IMPLANT
SUT ETHILON 3 0 PS 1 (SUTURE) ×3 IMPLANT
SUT FIBERWIRE #2 38 T-5 BLUE (SUTURE)
SUT TIGER TAPE 7 IN WHITE (SUTURE) IMPLANT
SUTURE FIBERWR #2 38 T-5 BLUE (SUTURE) IMPLANT
SYR 50ML LL SCALE MARK (SYRINGE) IMPLANT
TAPE FIBER 2MM 7IN #2 BLUE (SUTURE) IMPLANT
TOWEL GREEN STERILE FF (TOWEL DISPOSABLE) ×3 IMPLANT
TUBE CONNECTING 20'X1/4 (TUBING) ×1
TUBE CONNECTING 20X1/4 (TUBING) ×1 IMPLANT
TUBING ARTHRO INFLOW-ONLY STRL (TUBING) ×3 IMPLANT
WATER STERILE IRR 1000ML POUR (IV SOLUTION) ×1 IMPLANT

## 2018-06-11 NOTE — Discharge Instructions (Signed)
° ° °  Post-operative patient instructions  Shoulder Arthroscopy    Ice:  Place intermittent ice or cooler pack over your shoulder, 30 minutes on and 30 minutes off.  Continue this for the first 72 hours after surgery, then save ice for use after therapy sessions or on more active days.    Weight:  You may bear weight on your arm as your symptoms allow.  Motion:  Perform gentle shoulder motion as tolerated  Dressing:  Perform 1st dressing change at 2 days postoperative. A moderate amount of blood tinged drainage is to be expected.  So if you bleed through the dressing on the first or second day or if you have fevers, it is fine to change the dressing/check the wounds early and redress wound.  If it bleeds through again, or if the incisions are leaking frank blood, please call the office. May change dressing every 1-2 days thereafter to help watch wounds. Can purchase Tegaderm (or 17M Nexcare) water resistant dressings at local pharmacy / [facility].  Shower:  Light shower is ok after 2 days.  Please take shower, NO bath. Recover with gauze and ace wrap to help keep wounds protected.    Pain medication:  A narcotic pain medication has been prescribed.  Take as directed.  Typically you need narcotic pain medication more regularly during the first 3 to 5 days after surgery.  Decrease your use of the medication as the pain improves.  Narcotics can sometimes cause constipation, even after a few doses.  If you have problems with constipation, you can take an over the counter stool softener or light laxative.  If you have persistent problems, please notify your physicians office.  Physical therapy: Additional activity guidelines to be provided by your physician or physical therapist at follow-up visits.   Driving: Do not recommend driving x 2 weeks post surgical, especially if surgery performed on right side. Should not drive while taking narcotic pain medications. It typically takes at least 2 weeks to  restore sufficient neuromuscular function for normal reaction times for driving safety.   Call [phone] for questions or problems. Evenings you will be forwarded to the hospital operator.  Ask for the orthopaedic physician on call. Please call if you experience:    o Redness, foul smelling, or persistent drainage from the surgical site  o worsening shoulder pain and swelling not responsive to medication  o any calf pain and or swelling of the lower leg  o temperatures greater than 101.5 F o other questions or concerns   Thank you for allowing [place] to be a part of your care.

## 2018-06-11 NOTE — Progress Notes (Signed)
Attempted without success

## 2018-06-11 NOTE — Consult Note (Signed)
Cardiology Consultation:   [name] MRN: [id number]; DOB: 26-Apr-1965  Admit date: 06/11/2018 Date of Consult: 06/11/2018  Primary Care Provider: Glendale Chard, MD Primary Cardiologist: New , [place]  Primary Electrophysiologist:  None    Patient Profile:   Jessica Parks is a 53 y.o. female with a hx of shoulder pain  who is being seen today for the evaluation of hypertension and tachycardia related to an epinephrine overdose at the request of Dr. Erlinda Hong.( orthopedics ) .  History of Present Illness:   Jessica Parks is a healthy 52 year old female.  She does not have any cardiac history.  She was admitted for outpatient left shoulder surgery. Patient developed tachycardia and hypertension during the surgery and then it became evident that she had received an excessive amount of epinephrine in her regional heart block.  Currently instead of receiving 1 mg of epinephrine, received epinephrine 30 mg mixed in with the lidocaine/Marcaine.  She was controlled with esmolol drip during the surgery.  She now was brought over to the CCU for further management. Initially she was quite tachycardic with a heart rate of 120s.  Her blood pressure was 80/60.  She remains unresponsive.  Stat echocardiogram reveals an ejection fraction of around 35%.  There is vigorous contractility of the apex with akinesis of the basilar and mid segments.  EKG reveals deep T wave inversions initially.  Over time the T wave inversions have gradually improved.  She is on a ventilator and is completely sedated.  I was able to obtain some history from her sister but no history is available from the patient.  Past Medical History:  Diagnosis Date  . Anemia   . Anxiety disorder   . Arthritis   . Frozen shoulder    left- PT only 04/28/2018  . Hemorrhoids     Past Surgical History:  Procedure Laterality Date  . BREAST BIOPSY Left 2014   benign  . TUBAL LIGATION       Home Medications:  Prior to  Admission medications   Medication Sig Start Date End Date Taking? Authorizing Provider  Biotin 1 MG CAPS Take by mouth daily.   Yes [provider]  Biotin w/ Vitamins C & E (HAIR/SKIN/NAILS PO) Take by mouth.   Yes [provider]  Multiple Vitamin (MULTIVITAMIN) tablet Take 1 tablet by mouth daily.   Yes [provider]  omega-3 acid ethyl esters (LOVAZA) 1 G capsule Take 1 g by mouth daily.    Yes [provider]  Omega-3 Fatty Acids (FISH OIL PO) Take by mouth daily.   Yes [provider]  ibuprofen (ADVIL,MOTRIN) 800 MG tablet Take 1 tablet (800 mg total) by mouth every 8 (eight) hours as needed. 02/17/18   Hilts, Legrand Como, MD  methocarbamol (ROBAXIN) 750 MG tablet Take 1 tablet (750 mg total) by mouth 2 (two) times daily as needed for muscle spasms. 06/11/18   Leandrew Koyanagi, MD  ondansetron (ZOFRAN) 4 MG tablet Take 1-2 tablets (4-8 mg total) by mouth every 8 (eight) hours as needed for nausea or vomiting. 06/11/18   Leandrew Koyanagi, MD  oxyCODONE-acetaminophen (PERCOCET) 5-325 MG tablet Take 0.5-1 tablets by mouth every 6 (six) hours as needed for severe pain. 06/11/18   Leandrew Koyanagi, MD  promethazine (PHENERGAN) 25 MG tablet Take 1 tablet (25 mg total) by mouth every 6 (six) hours as needed for nausea. 06/11/18   Leandrew Koyanagi, MD    Inpatient Medications: Scheduled Meds: .  chlorhexidine gluconate (MEDLINE KIT)  15 mL Mouth Rinse BID  . [START ON 06/12/2018] enoxaparin (LOVENOX) injection  40 mg Subcutaneous Q24H  . famotidine  20 mg Per Tube BID  . insulin aspart  0-9 Units Subcutaneous Q4H  . mouth rinse  15 mL Mouth Rinse 10 times per day  . potassium chloride  40 mEq Per Tube BID   Continuous Infusions: . sodium chloride    . phenylephrine (NEO-SYNEPHRINE) Adult infusion    . potassium chloride     PRN Meds: docusate, fentaNYL (SUBLIMAZE) injection, fentaNYL (SUBLIMAZE) injection, midazolam, midazolam  Allergies:   No Known  Allergies  Social History:   Social History   Socioeconomic History  . Marital status: Married    Spouse name: Not on file  . Number of children: 2  . Years of education: Not on file  . Highest education level: Not on file  Occupational History  . Occupation: unemployed    Fish farm manager: [employer]  Social Needs  . Financial resource strain: Not on file  . Food insecurity:    Worry: Not on file    Inability: Not on file  . Transportation needs:    Medical: Not on file    Non-medical: Not on file  Tobacco Use  . Smoking status: Never Smoker  . Smokeless tobacco: Never Used  Substance and Sexual Activity  . Alcohol use: Yes    Comment: socially  . Drug use: No  . Sexual activity: Yes    Partners: Male    Birth control/protection: Surgical  Lifestyle  . Physical activity:    Days per week: Not on file    Minutes per session: Not on file  . Stress: Not on file  Relationships  . Social connections:    Talks on phone: Not on file    Gets together: Not on file    Attends religious service: Not on file    Active member of club or organization: Not on file    Attends meetings of clubs or organizations: Not on file    Relationship status: Not on file  . Intimate partner violence:    Fear of current or ex partner: Not on file    Emotionally abused: Not on file    Physically abused: Not on file    Forced sexual activity: Not on file  Other Topics Concern  . Not on file  Social History Narrative  . Not on file    Family History:    Family History  Problem Relation Age of Onset  . Breast cancer Maternal Grandmother   . Breast cancer Maternal Aunt   . Breast cancer Maternal Aunt   . Diabetes Maternal Aunt   . Colon cancer Neg Hx   . Rectal cancer Neg Hx      ROS:  Please see the history of present illness.   All other ROS reviewed and negative.     Physical Exam/Data:   Vitals:   06/11/18 1525 06/11/18 1600 06/11/18 1700 06/11/18 1800  BP:  (!) 80/61 (!) 89/78  105/75  Pulse: (!) 117 (!) 112 (!) 130 (!) 109  Resp: (!) 24 (!) 24 (!) 24 (!) 24  Temp:      TempSrc:      SpO2: 100% 100% 100% 100%  Weight:      Height:        Intake/Output Summary (Last 24 hours) at 06/11/2018 1835 Last data filed at 06/11/2018 1504 Gross per 24 hour  Intake 2000 ml  Output 360 ml  Net 1640 ml   Last 3 Weights 06/11/2018 06/02/2018 05/28/2018  Weight (lbs) 160 lb 15 oz 162 lb 163 lb  Weight (kg) 73 kg 73.483 kg 73.936 kg     Body mass index is 25.98 kg/m.  General: Middle-aged female, she still on the ventilator. HEENT: normal Lymph: no adenopathy Neck: no JVD Endocrine:  No thryomegaly Vascular: No carotid bruits; FA pulses 2+ bilaterally without bruits  Cardiac: Regular rate S1-S2.  She is tachycardic.  There is no significant murmur. Lungs: She is on the vent. Abd: soft, nontender, no hepatomegaly  Ext: no edema Musculoskeletal: Her wound was not assessed.  Her left arm is very warm.  Her pulses are normal.  The pulse ox readings on her left fingers are normal. Skin: warm and dry  Neuro:  CNs 2-12 intact, no focal abnormalities noted Psych:  Normal affect   EKG:  The EKG was personally reviewed and demonstrates: Initially she had deep T wave inversions in the inferior leads and anterior lateral leads.  Over time these T wave inversions have gradually improved. Telemetry:  Telemetry was personally reviewed and demonstrates: Sinus tachycardia  Relevant CV Studies:   Laboratory Data:  Chemistry Recent Labs  Lab 06/11/18 1445 06/11/18 1801  NA 142 140  K 2.4* 3.1*  CL 105  --   CO2 22  --   GLUCOSE 336*  --   BUN 8  --   CREATININE 1.13*  --   CALCIUM 7.6*  --   GFRNONAA 56*  --   GFRAA >60  --   ANIONGAP 15  --     Recent Labs  Lab 06/11/18 1445  PROT 4.9*  ALBUMIN 2.5*  AST 23  ALT 13  ALKPHOS 40  BILITOT 0.3   Hematology Recent Labs  Lab 06/11/18 1445 06/11/18 1801  WBC 8.1  --   RBC 3.66*  --   HGB 9.7* 10.9*  HCT 32.0*  32.0*  MCV 87.4  --   MCH 26.5  --   MCHC 30.3  --   RDW 11.8  --   PLT 194  --    Cardiac Enzymes Recent Labs  Lab 06/11/18 1445  TROPONINI 0.47*   No results for input(s): TROPIPOC in the last 168 hours.  BNPNo results for input(s): BNP, PROBNP in the last 168 hours.  DDimer No results for input(s): DDIMER in the last 168 hours.  Radiology/Studies:  Ct Head Wo Contrast  Result Date: 06/11/2018 CLINICAL DATA:  Ct head WO, Hypertensive emergency, r/o CVA / ICH EXAM: CT HEAD WITHOUT CONTRAST TECHNIQUE: Contiguous axial images were obtained from the base of the skull through the vertex without intravenous contrast. COMPARISON:  None. FINDINGS: Brain: No evidence of acute infarction, hemorrhage, hydrocephalus, extra-axial collection or mass lesion/mass effect. Vascular: No hyperdense vessel or unexpected calcification. Skull: Normal. Negative for fracture or focal lesion. Sinuses/Orbits: No acute finding. Other: A small amount of fluid layers within the posterior nasopharynx. IMPRESSION: Negative exam. Electronically Signed   By: Nolon Nations M.D.   On: 06/11/2018 16:52   Dg Chest Port 1 View  Result Date: 06/11/2018 CLINICAL DATA:  Intubation. EXAM: PORTABLE CHEST 1 VIEW COMPARISON:  08/06/2015 FINDINGS: Endotracheal tube tip projects 4.8 cm above the carina. Right internal jugular central venous line tip projects either in the central internal jugular vein or at the level of the internal jugular vein, superior vena cava confluence. No pneumothorax. There is extensive patchy consolidation throughout much of the  right lung most prominently in the central and lower right lung. There is additional opacity at the left medial lung base which may reflect additional infection or atelectasis. Cardiac silhouette is normal in size. IMPRESSION: 1. Endotracheal tube tip 4.8 cm above the carina. 2. Right internal jugular central venous line tip at or just above the internal jugular superior vena cava  confluence. 3. No pneumothorax. 4. Multifocal right lung pneumonia. Additional pneumonia versus atelectasis at the posteromedial left lung base. Electronically Signed   By: Lajean Manes M.D.   On: 06/11/2018 16:38    Assessment and Plan:   1. 1.  Epinephrine overdose: The patient received epinephrine 30 mg into her left shoulder joint.  This caused tachycardia and hypertension and diffuse coronary spasm.  Troponin levels are 0.47.  Echocardiogram reveals an ejection fraction of around 35%.  There is vigorous contractility of the apex with akinesis of the basilar and mid segments.  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   Over the past hour and a half her heart rate has gradually and steadily decreased.  Her last heart rate was 105.  Her blood pressure has increased from mid 80s up to 105.  She seems to be making improvements.  If she becomes tachycardic she should tolerate a low-dose esmolol drip.  At present she seems to be improving I do not think that she needs esmolol.  Plan is to repeat a full echocardiogram in several days. Cycle cardiac enzymes. I talked multiple times to Jessica Parks in [place] control ([phone]) They were particularly concerned about local ischemia near the injection site.  They are also concerned about ischemia in the distal aspect of the arm.  .  They recommend EKGs every 4 hours  Until tomorrow.    Critical care time 2 hours  Spent with family,  Exam and care of patient, discussion with nursing staff,  discussion with partners , review of echo , On Phone with poison control On phone with PCCM and ortho   For questions or updates, please contact [facility] Please consult [email or web address] for contact info under     Signed, Mertie Moores, MD  06/11/2018 6:35 PM

## 2018-06-11 NOTE — Progress Notes (Signed)
Pt weaned and extubated at 2115.  Pt placed on 5lpm nasal cannula with humidified oxygen.  Pt vocalizing post extubation.  HR 112, sats 96%, rr 20.  Pt tolerated extubation well.

## 2018-06-11 NOTE — Anesthesia Preprocedure Evaluation (Signed)
Anesthesia Evaluation  Patient identified by MRN, date of birth, ID band Patient awake    Reviewed: Allergy & Precautions, NPO status , Patient's Chart, lab work & pertinent test results  Airway Mallampati: II  TM Distance: >3 FB Neck ROM: Full    Dental no notable dental hx.    Pulmonary neg pulmonary ROS,    Pulmonary exam normal breath sounds clear to auscultation       Cardiovascular negative cardio ROS Normal cardiovascular exam Rhythm:Regular Rate:Normal     Neuro/Psych negative neurological ROS  negative psych ROS   GI/Hepatic negative GI ROS, Neg liver ROS,   Endo/Other  negative endocrine ROS  Renal/GU negative Renal ROS  negative genitourinary   Musculoskeletal negative musculoskeletal ROS (+)   Abdominal   Peds negative pediatric ROS (+)  Hematology negative hematology ROS (+)   Anesthesia Other Findings   Reproductive/Obstetrics negative OB ROS                             Anesthesia Physical Anesthesia Plan  ASA: II  Anesthesia Plan: General   Post-op Pain Management:  Regional for Post-op pain   Induction: Intravenous  PONV Risk Score and Plan: 3 and Ondansetron, Dexamethasone, Midazolam and Treatment may vary due to age or medical condition  Airway Management Planned: Oral ETT  Additional Equipment:   Intra-op Plan:   Post-operative Plan: Extubation in OR  Informed Consent: I have reviewed the patients History and Physical, chart, labs and discussed the procedure including the risks, benefits and alternatives for the proposed anesthesia with the patient or authorized representative who has indicated his/her understanding and acceptance.     Dental advisory given  Plan Discussed with: CRNA  Anesthesia Plan Comments:         Anesthesia Quick Evaluation

## 2018-06-11 NOTE — Anesthesia Procedure Notes (Signed)
Arterial Line Insertion  Patient location: OR. Preanesthetic checklist: patient identified, IV checked, site marked and monitors and equipment checked Emergency situation Patient sedated radial was placed Catheter size: 20 Fr Hand hygiene performed  and maximum sterile barriers used   Attempts: 1 Procedure performed without using ultrasound guided technique. Following insertion, dressing applied. Post procedure assessment: normal and unchanged  Patient tolerated the procedure well with no immediate complications.

## 2018-06-11 NOTE — Anesthesia Procedure Notes (Signed)
Anesthesia Regional Block: Supraclavicular block   Pre-Anesthetic Checklist: ,, timeout performed, Correct Patient, Correct Site, Correct Laterality, Correct Procedure, Correct Position, site marked, Risks and benefits discussed,  Surgical consent,  Pre-op evaluation,  At surgeon's request and post-op pain management  Laterality: Left and Upper  Prep: Maximum Sterile Barrier Precautions used, chloraprep       Needles:  Injection technique: Single-shot  Needle Type: Echogenic Stimulator Needle     Needle Length: 10cm      Additional Needles:   Procedures:,,,, ultrasound used (permanent image in chart),,,,  Narrative:  Start time: 06/11/2018 11:37 AM End time: 06/11/2018 11:47 AM Injection made incrementally with aspirations every 5 mL.  Performed by: Personally  Anesthesiologist: Montez Hageman, MD  Additional Notes: Risks, benefits and alternative to block explained extensively.  Patient tolerated procedure well, without complications.

## 2018-06-11 NOTE — Progress Notes (Signed)
PCCM Overnight   Called to bedside to evaluate pt Awake alert and appropriate with non verbal responses Able to lift her head off of bed and follow commands Currently on PRVC with FiO2 50% and PEEP 5 with SATs 100% Weaned down to 30% and O2 sats >100  Asked RN to call RT to perform SBT Pt has been off of sedation  SBT performed on PS RSBI: 61 TV >500cc and tolerating trial well  Plan: Will extubate to [place] Discussed with patient , RN and family If pt experiences any SOB or increased work of breathing PCCM is to be notified. Continue on supplemental O2 via [place]  Signed Dr Seward [facility] Critical [facility] Locums

## 2018-06-11 NOTE — H&P (Signed)
PREOPERATIVE H&P  Chief Complaint: left shoulder adhesive capsulitis  HPI: Jessica Parks is a 53 y.o. female who presents for surgical treatment of left shoulder adhesive capsulitis.  She denies any changes in medical history.  Past Medical History:  Diagnosis Date  . Anemia   . Anxiety disorder   . Arthritis   . Frozen shoulder    left- PT only 04/28/2018  . Hemorrhoids    Past Surgical History:  Procedure Laterality Date  . BREAST BIOPSY Left 2014   benign  . TUBAL LIGATION     Social History   Socioeconomic History  . Marital status: Married    Spouse name: Not on file  . Number of children: 2  . Years of education: Not on file  . Highest education level: Not on file  Occupational History  . Occupation: unemployed    Fish farm manager: [employer]  Social Needs  . Financial resource strain: Not on file  . Food insecurity:    Worry: Not on file    Inability: Not on file  . Transportation needs:    Medical: Not on file    Non-medical: Not on file  Tobacco Use  . Smoking status: Never Smoker  . Smokeless tobacco: Never Used  Substance and Sexual Activity  . Alcohol use: Yes    Comment: socially  . Drug use: No  . Sexual activity: Yes    Partners: Male    Birth control/protection: Surgical  Lifestyle  . Physical activity:    Days per week: Not on file    Minutes per session: Not on file  . Stress: Not on file  Relationships  . Social connections:    Talks on phone: Not on file    Gets together: Not on file    Attends religious service: Not on file    Active member of club or organization: Not on file    Attends meetings of clubs or organizations: Not on file    Relationship status: Not on file  Other Topics Concern  . Not on file  Social History Narrative  . Not on file   Family History  Problem Relation Age of Onset  . Breast cancer Maternal Grandmother   . Breast cancer Maternal Aunt   . Breast cancer Maternal Aunt   . Diabetes Maternal Aunt     . Colon cancer Neg Hx   . Rectal cancer Neg Hx    No Known Allergies Prior to Admission medications   Medication Sig Start Date End Date Taking? Authorizing Provider  Biotin 1 MG CAPS Take by mouth daily.   Yes [provider]  Biotin w/ Vitamins C & E (HAIR/SKIN/NAILS PO) Take by mouth.   Yes [provider]  Multiple Vitamin (MULTIVITAMIN) tablet Take 1 tablet by mouth daily.   Yes [provider]  omega-3 acid ethyl esters (LOVAZA) 1 G capsule Take 1 g by mouth daily.    Yes [provider]  ibuprofen (ADVIL,MOTRIN) 800 MG tablet Take 1 tablet (800 mg total) by mouth every 8 (eight) hours as needed. 02/17/18   Hilts, Legrand Como, MD     Positive ROS: All other systems have been reviewed and were otherwise negative with the exception of those mentioned in the HPI and as above.  Physical Exam: General: Alert, no acute distress Cardiovascular: No pedal edema Respiratory: No cyanosis, no use of accessory musculature GI: abdomen soft Skin: No lesions in the area of chief complaint Neurologic: Sensation intact distally Psychiatric:  Patient is competent for consent with normal mood and affect Lymphatic: no lymphedema  MUSCULOSKELETAL: exam stable  Assessment: left shoulder adhesive capsulitis  Plan: Plan for Procedure(s): LEFT SHOULDER ARTHROSCOPY WITH EXTENSIVE DEBRIDEMENT, LYSIS OF ADHESIONS, SUBACROMIAL DECOMPRESSION AND MANIPULATION UNDER ANESTHESIA  The risks benefits and alternatives were discussed with the patient including but not limited to the risks of nonoperative treatment, versus surgical intervention including infection, bleeding, nerve injury,  blood clots, cardiopulmonary complications, morbidity, mortality, among others, and they were willing to proceed.   Eduard Roux, MD   06/11/2018 7:59 AM

## 2018-06-11 NOTE — Progress Notes (Signed)
No pregnancy test needed per Dr. Marcell Barlow.

## 2018-06-11 NOTE — Progress Notes (Signed)
Neurology Consultation Reason for Consult: AMS Referring Physician: Rodman Pickle  CC: Altered mental status  History is obtained from: Chart review  HPI: Jessica Parks is a 53 y.o. female admitted for shoulder surgery, and during the procedure epinephrine was injected to help control bleeding at the site.  Unfortunately, it appears that a concentrated solution was injected resulting in some cardiac abnormalities.  Following the procedure, critical care evaluated patient and at that time she remained unresponsive and was felt that her unresponsiveness was longer than would be expected for typical anesthesia to wear off.  Therefore neurology has been consulted.  In the interim between when they saw her and the time that I evaluated her, she had marked improvement in her neurological status.  Of note, she was slightly hypotensive during her tachycardia, in the 80s, but never profoundly so.   ROS:  Unable to obtain due to intubation  Past Medical History:  Diagnosis Date  . Anemia   . Anxiety disorder   . Arthritis   . Frozen shoulder    left- PT only 04/28/2018  . Hemorrhoids      Family History  Problem Relation Age of Onset  . Breast cancer Maternal Grandmother   . Breast cancer Maternal Aunt   . Breast cancer Maternal Aunt   . Diabetes Maternal Aunt   . Colon cancer Neg Hx   . Rectal cancer Neg Hx      Social History:  reports that she has never smoked. She has never used smokeless tobacco. She reports current alcohol use. She reports that she does not use drugs.   Exam: Current vital signs: BP 90/68   Pulse (!) 103   Temp 98.1 F (36.7 C) (Oral)   Resp (!) 26   Ht 5\' 6"  (1.676 m)   Wt 73 kg   SpO2 94%   BMI 25.98 kg/m  Vital signs in last 24 hours: Temp:  [98.1 F (36.7 C)] 98.1 F (36.7 C) (03/06 1044) Pulse Rate:  [63-130] 103 (03/06 2130) Resp:  [11-26] 26 (03/06 2130) BP: (80-144)/(61-92) 90/68 (03/06 2130) SpO2:  [94 %-100 %] 94 % (03/06  2130) FiO2 (%):  [40 %-100 %] 40 % (03/06 2055) Weight:  [73 kg] 73 kg (03/06 1042)   Physical Exam  Constitutional: Appears well-developed and well-nourished.  Psych: Affect appropriate to situation Eyes: No scleral injection HENT: Intubated Head: Normocephalic.  Cardiovascular: Normal rate and regular rhythm.  Respiratory: Breathing over the vent GI: Soft.  No distension. There is no tenderness.  Skin: WDI  Neuro: Mental Status: Patient is awake, alert, follows commands readily. Cranial Nerves: II: Visual Fields are full. Pupils are equal, round, and reactive to light.   III,IV, VI: EOMI without ptosis or diploplia.  V: Facial sensation is symmetric to temperature VII: Facial movement is symmetric.  XII: Able to protrude tongue, difficult to judge deviation due to ET tube Motor: She moves the right arm and bilateral lower extremities well, left arm is under a block which will immobilize it for 72 hours sensory: Sensation is intact in the right arm and bilateral legs, decreased in the left arm as would be expected Deep Tendon Reflexes: 2+ in the right biceps and patellae.  Cerebellar: No clear ataxia  I have reviewed labs in epic and the results pertinent to this consultation are: CMP-hypokalemia, calcium of 7.6, BUN of 2.5, elevated glucose at 336  I have reviewed the images obtained: CT head-negative  Impression: 53 year old female with prolonged emergence from  anesthesia.  I am uncertain for why it took her longer than typical to awaken after her procedure, however with her rapid improvement, I have low suspicion for a primary neurological event.  If she were to continue having problems after extubation, then please recall neurology at that time.  Recommendations: 1) no further recommendations, if she continues to have problems after extubation then please recall for further evaluation.   Roland Rack, MD [facility] [phone]  If 7pm- 7am,  please page neurology on call as listed in [place].

## 2018-06-11 NOTE — Consult Note (Addendum)
NAME:  Jessica Parks, MRN:  [id number], DOB:  1965/06/17, LOS: 0 ADMISSION DATE:  06/11/2018, CONSULTATION DATE:  06/11/18 REFERRING MD:  Dr. Erlinda Hong, CHIEF COMPLAINT:  Respiratory Insufficiency    Brief History   53 y/o F admitted for planned left shoulder arthroscopy for lysis of adhesions.  Intraoperative course complicated by hypertensive emergency with EKG changes and pulmonary edema.  Left intubated post-operatively and returned to ICU.   History of present illness   53 y/o F admitted 3/6 for planned arthroscopic left shoulder lysis of adhesions.  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  Per report, the patient underwent shoulder injection at the beginning of the case.  During the operative period, it was noted that she was hypertensive and tachycardic.  She was treated with 200 mg esmolol throughout the case.  She was noted to have EKG changes with ST elevation.  The patient was turned and noted to have pink frothy sputum in ETT.  Central line placed for IV access.  Per report, while assessing the patient it was discovered that the block concentration of epinephrine was an incorrect ratio.  She was left intubated and transferred to ICU for further care.  BP soft normal on arrival to ICU.  Past Medical History  Arthritis  Anxiety disorder Left frozen shoulder  Hemorrhoids  Significant Hospital Events   3/06  Admit for left shoulder surgery, left intubated post-operatively   Consults:  PCCM  Cardiology   Procedures:  ETT 3/6 >>  R IJ TLC (anesthesia) 3/6 >> Significant Diagnostic Tests:  CT Head 3/6 >>   Micro Data:    Antimicrobials:    Interim history/subjective:  As above.  Objective   Blood pressure 127/86, pulse 68, temperature 98.1 F (36.7 C), temperature source Oral, resp. rate 12, height 5\' 6"  (1.676 m), weight 73 kg, SpO2 98 %.        Intake/Output Summary (Last 24 hours) at 06/11/2018 1527 Last data filed at 06/11/2018  1504 Gross per 24 hour  Intake 2000 ml  Output 360 ml  Net 1640 ml   Filed Weights   06/02/18 1340 06/11/18 1042  Weight: 73.5 kg 73 kg    Examination: General: adult female lying in bed on vent HENT: ETT, mm pink/moist, pupils 57mm=/R  Lungs: even/non-labored, lungs bilaterally clear Cardiovascular: s1s2 distant, regular Abdomen: soft/non-tender, bsx4 active  Extremities: warm/dry, no edema  Neuro: sedate   Resolved Hospital Problem list     Assessment & Plan:   Hypertensive Emergency / Iatrogenic Acute Hypertension  P: Hypertension resolved, BP now soft normal  Neosynephrine for MAP >65 ICU monitoring  Assess EKG, troponin now  Assess CT Head now to rule out ICH Frequent neuro exam  Minimize sedation to allow for neuro assessment    Sinus Tachycardia Reported EKG Changes Intra-Operative  P: Tele monitoring  Cardiology consulted per Ortho Consider ECHO > defer to Cardiology  Trend troponin Q6    Acute Respiratory Insufficiency  -in setting of Hypertensive Emergency  Acute Respiratory Acidosis  P: PRVC 8cc/kg  Wean PEEP / FiO2 for sats >90% Rate 24, assess ABG in one hour  Assess CXR now for ETT  / central line placement  PRN fentanyl / versed to maintain RASS Goal of 0 to -1  AM CXR  Plan for WUA / SBT in am for probable extubation   Left Shoulder Arthroscopy with Lysis of Adhesions  P: Post operative care per Ortho   Hypokalemia AG Metabolic  Acidosis   At Risk AKI  P: Trend BMP / urinary output Replace electrolytes as indicated, replace K+ 3/6 Avoid nephrotoxic agents, ensure adequate renal perfusion NS at 86ml/hr    Anemia -no baseline Hgb P: Follow up PM H/H CBC in am  Reschedule lovenox for am, await CT head   Hyperglycemia -likely stress response   P: SSI sensitive scale    Best practice:  Diet: NPO Pain/Anxiety/Delirium protocol (if indicated): PRN fentanyl, versed  VAP protocol (if indicated): ordered  DVT prophylaxis:  enoxaparin, rescheduled for am 3/7 to ensure CT head within normal limits   GI prophylaxis: Pepcid  Glucose control: n/a  Mobility: bedrest  Code Status: Full Code  Family Communication: Family updated per Dr. Erlinda Hong Disposition: ICU   Labs   CBC: Recent Labs  Lab 06/11/18 1445  WBC 8.1  NEUTROABS PENDING  HGB 9.7*  HCT 32.0*  MCV 87.4  PLT 124    Basic Metabolic Panel: No results for input(s): NA, K, CL, CO2, GLUCOSE, BUN, CREATININE, CALCIUM, MG, PHOS in the last 168 hours. GFR: CrCl cannot be calculated (No successful lab value found.). Recent Labs  Lab 06/11/18 1445  WBC 8.1    Liver Function Tests: No results for input(s): AST, ALT, ALKPHOS, BILITOT, PROT, ALBUMIN in the last 168 hours. No results for input(s): LIPASE, AMYLASE in the last 168 hours. No results for input(s): AMMONIA in the last 168 hours.  ABG    Component Value Date/Time   PHART 7.122 (LL) 06/11/2018 1400   PCO2ART 53.9 (H) 06/11/2018 1400   PO2ART 86.4 06/11/2018 1400   HCO3 16.8 (L) 06/11/2018 1400   ACIDBASEDEF 10.9 (H) 06/11/2018 1400   O2SAT 90.9 06/11/2018 1400     Coagulation Profile: Recent Labs  Lab 06/11/18 1445  INR 1.2    Cardiac Enzymes: No results for input(s): CKTOTAL, CKMB, CKMBINDEX, TROPONINI in the last 168 hours.  HbA1C: No results found for: HGBA1C  CBG: No results for input(s): GLUCAP in the last 168 hours.  Review of Systems:   Unable to complete as patient is on mechanical ventilation.  Past Medical History  She,  has a past medical history of Anemia, Anxiety disorder, Arthritis, Frozen shoulder, and Hemorrhoids.   Surgical History    Past Surgical History:  Procedure Laterality Date  . BREAST BIOPSY Left 2014   benign  . TUBAL LIGATION       Social History   reports that she has never smoked. She has never used smokeless tobacco. She reports current alcohol use. She reports that she does not use drugs.   Family History   Her family history  includes Breast cancer in her maternal aunt, maternal aunt, and maternal grandmother; Diabetes in her maternal aunt. There is no history of Colon cancer or Rectal cancer.   Allergies No Known Allergies   Home Medications  Prior to Admission medications   Medication Sig Start Date End Date Taking? Authorizing Provider  Biotin 1 MG CAPS Take by mouth daily.   Yes [provider]  Biotin w/ Vitamins C & E (HAIR/SKIN/NAILS PO) Take by mouth.   Yes [provider]  Multiple Vitamin (MULTIVITAMIN) tablet Take 1 tablet by mouth daily.   Yes [provider]  omega-3 acid ethyl esters (LOVAZA) 1 G capsule Take 1 g by mouth daily.    Yes [provider]  Omega-3 Fatty Acids (FISH OIL PO) Take by mouth daily.   Yes [provider]  ibuprofen (ADVIL,MOTRIN) 800 MG  tablet Take 1 tablet (800 mg total) by mouth every 8 (eight) hours as needed. 02/17/18   Hilts, Legrand Como, MD  methocarbamol (ROBAXIN) 750 MG tablet Take 1 tablet (750 mg total) by mouth 2 (two) times daily as needed for muscle spasms. 06/11/18   Leandrew Koyanagi, MD  ondansetron (ZOFRAN) 4 MG tablet Take 1-2 tablets (4-8 mg total) by mouth every 8 (eight) hours as needed for nausea or vomiting. 06/11/18   Leandrew Koyanagi, MD  oxyCODONE-acetaminophen (PERCOCET) 5-325 MG tablet Take 0.5-1 tablets by mouth every 6 (six) hours as needed for severe pain. 06/11/18   Leandrew Koyanagi, MD  promethazine (PHENERGAN) 25 MG tablet Take 1 tablet (25 mg total) by mouth every 6 (six) hours as needed for nausea. 06/11/18   Leandrew Koyanagi, MD     Critical care time: 21 minutes     Noe Gens, NP-C [facility] Pgr: [phone] or if no answer [phone] 06/11/2018, 3:27 PM

## 2018-06-11 NOTE — Plan of Care (Signed)
Pt vital signs stable. Family at beside and updated. Will continue to monitor

## 2018-06-11 NOTE — Progress Notes (Signed)
Assisted Dr. Carignan with left, ultrasound guided, interscalene  block. Side rails up, monitors on throughout procedure. See vital signs in flow sheet. Tolerated Procedure well. 

## 2018-06-11 NOTE — Progress Notes (Addendum)
Saw the patient this afternoon in the ICU.   Appreciate CCM and cardiology's care for the patient. Patient received an exparel supraclavicular block which will cause motor/sensory block to the LUE for up to 72 hrs. Will continue to follow along.   Family updated.  Azucena Cecil, MD [facility] [phone] 6:50 PM

## 2018-06-11 NOTE — Anesthesia Procedure Notes (Addendum)
Central Venous Catheter Insertion Performed by: Montez Hageman, MD, anesthesiologist Start/End3/09/2018 2:00 PM, 06/11/2018 2:05 PM Patient location: OR. Emergency situation Preanesthetic checklist: patient identified, IV checked, site marked, monitors and equipment checked and timeout performed Position: Trendelenburg Patient sedated Hand hygiene performed , maximum sterile barriers used  and Seldinger technique used Catheter size: 8 Fr Total catheter length 20. Central line was placed.Triple lumen Procedure performed using ultrasound guided (no image. emergency) technique. Ultrasound Notes:anatomy identified, needle tip was noted to be adjacent to the nerve/plexus identified and no ultrasound evidence of intravascular and/or intraneural injection Attempts: 1 Following insertion, dressing applied, line sutured and Biopatch. Post procedure assessment: blood return through all ports  Patient tolerated the procedure well with no immediate complications.

## 2018-06-11 NOTE — Progress Notes (Signed)
*  PRELIMINARY RESULTS* Echocardiogram 2D Echocardiogram Limited has been performed.  Jessica Parks 06/11/2018, 6:09 PM

## 2018-06-11 NOTE — Progress Notes (Signed)
Husband was updated on events. All questions answered.

## 2018-06-11 NOTE — Op Note (Signed)
   Date of Surgery: 06/11/2018  INDICATIONS: The patient is a 53 year old female with left shoulder adhesive capsulitis that has failed conservative treatment;  The patient did consent to the procedure after discussion of the risks and benefits.  PREOPERATIVE DIAGNOSIS: Left shoulder adhesive capsulitis  POSTOPERATIVE DIAGNOSIS: Same.  PROCEDURE:  1. Arthroscopic left shoulder lysis of adhesions with manipulation under general anesthesia 2. Arthroscopic extensive debridement of biceps, synovitis, rotator interval, labrum  SURGEON: N. Eduard Roux, M.D.  ASSIST: Ciro Backer [place], [place]; necessary for the timely completion of procedure and due to complexity of procedure..  ANESTHESIA:  general, regional  IV FLUIDS AND URINE: See anesthesia.  ESTIMATED BLOOD LOSS: minimal mL.  IMPLANTS: none  COMPLICATIONS: pulmonary edema, presumed MI.  DESCRIPTION OF PROCEDURE: The patient was brought to the operating room and placed supine on the operating table.  The patient had been signed prior to the procedure and this was documented. The patient had the anesthesia placed by the anesthesiologist.  A time-out was performed to confirm that this was the correct patient, site, side and location.  We first began with closed manipulation of the left shoulder.  I was able to audibly hear tearing of capsular tissue during forward flexion and external rotation of the shoulder.  We then moved onto the operative portion of the surgery.  The patient did receive antibiotics prior to the incision and was re-dosed during the procedure as needed at indicated intervals.  The patient was then moved into the lateral position with the operative extremity suspended in the fishing pole mechanism. The patient had the operative extremity prepped and draped in the standard surgical fashion.    Incisions were made for arthroscopy portals.  An anterior and posterior shoulder portal were established.  There was extensive  synovitis within the shoulder joint which was debrided.  The biceps and the rotator cuff were both unremarkable.  There was extensive contracture of the rotator interval which was released using an ArthroCare wand.  The tissue was then released off of the subscapularis tendon.  The anterior capsule was then also released.  We then switched portals and released the posterior capsule under arthroscopic visualization.  Once this was done the excess fluid was removed from the shoulder.  The surgical incisions were left open.  Sterile dressings were applied.  Another manipulation of the shoulder was performed and she was able to achieve a significant improvement in external and internal rotation.  During the surgery she had trouble maintaining a blood pressure after initial period of hypertensiveness.  It was at that time we recognized that the initial intra-articular injection was over concentrated.  30 mg of epinephrine was injected instead of the usual 1 mg.  Fortunately she did not lose a pulse and she remained stable.  She remained intubated and was transferred to the ICU.   Azucena Cecil, MD [place] 1:35 PM

## 2018-06-12 ENCOUNTER — Inpatient Hospital Stay (HOSPITAL_COMMUNITY): Payer: BLUE CROSS/BLUE SHIELD

## 2018-06-12 DIAGNOSIS — I429 Cardiomyopathy, unspecified: Secondary | ICD-10-CM

## 2018-06-12 DIAGNOSIS — I161 Hypertensive emergency: Secondary | ICD-10-CM

## 2018-06-12 DIAGNOSIS — R7989 Other specified abnormal findings of blood chemistry: Secondary | ICD-10-CM

## 2018-06-12 LAB — BASIC METABOLIC PANEL
Anion gap: 9 (ref 5–15)
BUN: 10 mg/dL (ref 6–20)
CALCIUM: 8.4 mg/dL — AB (ref 8.9–10.3)
CO2: 22 mmol/L (ref 22–32)
Chloride: 107 mmol/L (ref 98–111)
Creatinine, Ser: 0.96 mg/dL (ref 0.44–1.00)
GFR calc Af Amer: >60 mL/min (ref >60)
GFR calc non Af Amer: >60 mL/min (ref >60)
Glucose, Bld: 143 mg/dL — ABNORMAL HIGH (ref 70–99)
Potassium: 4.4 mmol/L (ref 3.5–5.1)
Sodium: 138 mmol/L (ref 135–145)

## 2018-06-12 LAB — CBC
HCT: 33.6 % — ABNORMAL LOW (ref 36.0–46.0)
Hemoglobin: 10.3 g/dL — ABNORMAL LOW (ref 12.0–15.0)
MCH: 26.8 pg (ref 26.0–34.0)
MCHC: 30.7 g/dL (ref 30.0–36.0)
MCV: 87.3 fL (ref 80.0–100.0)
Platelets: 186 10*3/uL (ref 150–400)
RBC: 3.85 MIL/uL — ABNORMAL LOW (ref 3.87–5.11)
RDW: 12 % (ref 11.5–15.5)
WBC: 14 10*3/uL — ABNORMAL HIGH (ref 4.0–10.5)
nRBC: 0 % (ref 0.0–0.2)

## 2018-06-12 LAB — GLUCOSE, CAPILLARY
GLUCOSE-CAPILLARY: 120 mg/dL — AB (ref 70–99)
Glucose-Capillary: 137 mg/dL — ABNORMAL HIGH (ref 70–99)
Glucose-Capillary: 102 mg/dL — ABNORMAL HIGH (ref 70–99)
Glucose-Capillary: 143 mg/dL — ABNORMAL HIGH (ref 70–99)
Glucose-Capillary: 131 mg/dL — ABNORMAL HIGH (ref 70–99)

## 2018-06-12 LAB — ECHOCARDIOGRAM COMPLETE
Height: 66 in
Weight: 2574.97 oz

## 2018-06-12 LAB — TROPONIN I: TROPONIN I: 2.59 ng/mL — AB (ref <0.03)

## 2018-06-12 LAB — HIV ANTIBODY (ROUTINE TESTING W REFLEX): HIV Screen 4th Generation wRfx: NONREACTIVE

## 2018-06-12 MED ORDER — TRAMADOL HCL 50 MG PO TABS
50.0000 mg | ORAL_TABLET | Freq: Four times a day (QID) | ORAL | Status: DC | PRN
  Administered 2018-06-12: 100 mg via ORAL
  Administered 2018-06-12: 50 mg via ORAL
  Filled 2018-06-12: qty 2
  Filled 2018-06-12: qty 1
  Filled 2018-06-12: qty 2

## 2018-06-12 MED ORDER — PERFLUTREN LIPID MICROSPHERE
1.0000 mL | INTRAVENOUS | Status: AC | PRN
  Administered 2018-06-12: 5 mL via INTRAVENOUS
  Filled 2018-06-12: qty 10

## 2018-06-12 MED ORDER — OXYCODONE HCL 5 MG PO TABS
5.0000 mg | ORAL_TABLET | ORAL | Status: DC | PRN
  Administered 2018-06-12: 10 mg via ORAL
  Filled 2018-06-12: qty 2

## 2018-06-12 MED ORDER — INSULIN ASPART 100 UNIT/ML ~~LOC~~ SOLN: 0.0000 [IU] | Freq: Three times a day (TID) | SUBCUTANEOUS | Status: DC

## 2018-06-12 MED ORDER — KETOROLAC TROMETHAMINE 15 MG/ML IJ SOLN
15.0000 mg | Freq: Four times a day (QID) | INTRAMUSCULAR | Status: DC | PRN
  Administered 2018-06-12 – 2018-06-13 (×4): 15 mg via INTRAVENOUS
  Filled 2018-06-12 (×4): qty 1

## 2018-06-12 MED ORDER — MAGNESIUM SULFATE 2 GM/50ML IV SOLN
2.0000 g | Freq: Once | INTRAVENOUS | Status: AC
  Administered 2018-06-12: 2 g via INTRAVENOUS
  Filled 2018-06-12: qty 50

## 2018-06-12 MED ORDER — SODIUM CHLORIDE 0.9 % IV BOLUS
500.0000 mL | Freq: Once | INTRAVENOUS | Status: AC
  Administered 2018-06-12: 500 mL via INTRAVENOUS

## 2018-06-12 NOTE — Progress Notes (Signed)
   Subjective:  Patient reports pain as mild.  Peripheral nerve block still in effect. She extubated well last night and has done very well since then.  Objective:   VITALS:   Vitals:   06/12/18 0759 06/12/18 0800 06/12/18 0900 06/12/18 1136  BP:  (!) 89/68 (!) 88/67   Pulse:  93 97   Resp:  (!) 22 20   Temp: 98.7 F (37.1 C)   99.5 F (37.5 C)  TempSrc: Oral   Oral  SpO2:  94% 94%   Weight:      Height:        Strong radial pulse Surgical sites are hemostatic w/o evidence of tissue compromise Scant drainage from scope fluid Dressing intact   Lab Results  Component Value Date   WBC 14.0 (H) 06/12/2018   HGB 10.3 (L) 06/12/2018   HCT 33.6 (L) 06/12/2018   MCV 87.3 06/12/2018   PLT 186 06/12/2018     Assessment/Plan:  1 Day Post-Op   - Expected postop acute blood loss anemia - will monitor for symptoms - Up with PT/OT - no restrictions with LUE - sling for comfort only - up with PT as soon as medically stable - CXR improved, troponins trending downward - CT and MRI head normal - vitals improving - patient in good spirits this morning - appreciate CCM and cardiology's care for patient  Eduard Roux 06/12/2018, 11:42 AM [phone]

## 2018-06-12 NOTE — Evaluation (Signed)
Physical Therapy Evaluation Patient Details Name: Jessica Parks MRN: [id number] DOB: May 05, 1965 Today's Date: 06/12/2018   History of Present Illness  Pt is a 53 yo female admitted 06/11/18 for left shoulder arthroscopy for lysis of adhesions. Intraoperative course complicated by hypertensive emergency with EKG changes and pulmonary edema. Pt intubated 3/6; extubated later same evening. MRI brain negative for acute changes. PMH includes frozen shoulder, arthritis, anxiety.    Clinical Impression  Pt presents with an overall decrease in functional mobility secondary to above. PTA, pt indep and lives with supportive family. Educ on precautions and importance of mobility. Today, able to sit EOB and initiate L shoulder PROM; eval limited as pt became dizzy and diaphoretic requiring return to supine, SBP down to 60s and SpO2 86% on RA (RN present). Expect pt to progress well with mobility and benefit from Outpatient PT/OT. Will follow acutely to progress mobility.    Follow Up Recommendations Outpatient PT;Supervision - Intermittent    Equipment Recommendations  None recommended by PT    Recommendations for Other Services       Precautions / Restrictions Precautions Precautions: Fall;Other (comment) Precaution Comments: Soft BP Required Braces or Orthoses: Sling Restrictions Weight Bearing Restrictions: No Other Position/Activity Restrictions: Per Dr. Erlinda Hong, no LUE WB restrictions, ROM as tolerated      Mobility  Bed Mobility Overal bed mobility: Modified Independent             General bed mobility comments: HOB elevated  Transfers                 General transfer comment: NT secondary to drop in BP while sitting EOB requiring totalA to return to supine (RN present)  Ambulation/Gait                Stairs            Wheelchair Mobility    Modified Rankin (Stroke Patients Only)       Balance Overall balance assessment: Needs assistance   Sitting  balance-Leahy Scale: Good Sitting balance - Comments: Able to tolerate sitting EOB for shoulder PROM                                     Pertinent Vitals/Pain Pain Assessment: Faces Faces Pain Scale: Hurts little more Pain Location: LUE Pain Descriptors / Indicators: Guarding;Grimacing Pain Intervention(s): Limited activity within patient's tolerance;Monitored during session;Premedicated before session    Home Living Family/patient expects to be discharged to:: Private residence Living Arrangements: Spouse/significant other Available Help at Discharge: Family;Available 24 hours/day Type of Home: House Home Access: Stairs to enter Entrance Stairs-Rails: None Entrance Stairs-Number of Steps: 2 Home Layout: Two level;Bed/bath upstairs Home Equipment: None Additional Comments: Husband will be available for initial 24/7 support    Prior Function Level of Independence: Independent         Comments: 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     Journalist, newspaper        Extremity/Trunk Assessment   Upper Extremity Assessment Upper Extremity Assessment: LUE deficits/detail LUE Deficits / Details: Pt unable to actively move shoulder due to pain; able to passively flex to 90', further eval limited due to drop in BP LUE: Unable to fully assess due to pain LUE Sensation: decreased light touch LUE Coordination: decreased fine motor;decreased gross motor    Lower Extremity Assessment Lower Extremity Assessment: Overall WFL for tasks assessed       Communication  Communication: No difficulties  Cognition Arousal/Alertness: Awake/alert Behavior During Therapy: WFL for tasks assessed/performed Overall Cognitive Status: Within Functional Limits for tasks assessed                                        General Comments      Exercises     Assessment/Plan    PT Assessment Patient needs continued PT services  PT Problem List Decreased strength;Decreased range of  motion;Decreased activity tolerance;Decreased balance;Decreased mobility;Decreased knowledge of use of DME;Decreased knowledge of precautions;Cardiopulmonary status limiting activity;Pain       PT Treatment Interventions DME instruction;Gait training;Stair training;Functional mobility training;Therapeutic activities;Therapeutic exercise;Balance training;Patient/family education    PT Goals (Current goals can be found in the Care Plan section)  Acute Rehab PT Goals Patient Stated Goal: Decreased shoulder pain PT Goal Formulation: With patient Time For Goal Achievement: 06/26/18 Potential to Achieve Goals: Good    Frequency Min 5X/week   Barriers to discharge        Co-evaluation               AM-PAC PT "6 Clicks" Mobility  Outcome Measure Help needed turning from your back to your side while in a flat bed without using bedrails?: None Help needed moving from lying on your back to sitting on the side of a flat bed without using bedrails?: None Help needed moving to and from a bed to a chair (including a wheelchair)?: A Little Help needed standing up from a chair using your arms (e.g., wheelchair or bedside chair)?: A Little Help needed to walk in hospital room?: A Little Help needed climbing 3-5 steps with a railing? : A Little 6 Click Score: 20    End of Session   Activity Tolerance: Treatment limited secondary to medical complications (Comment) Patient left: in bed;with call bell/phone within reach;with family/visitor present;with nursing/sitter in room Nurse Communication: Mobility status PT Visit Diagnosis: Other abnormalities of gait and mobility (R26.89);Pain Pain - Right/Left: Left Pain - part of body: Shoulder    Time: 1130-1150 PT Time Calculation (min) (ACUTE ONLY): 20 min   Charges:   PT Evaluation $PT Eval Moderate Complexity: 1 Mod        Mabeline Caras, PT, DPT Acute Rehabilitation Services  Pager [phone] Office [place] 06/12/2018, 12:55 PM

## 2018-06-12 NOTE — Progress Notes (Signed)
*  PRELIMINARY RESULTS* Echocardiogram 2D Echocardiogram has been performed with Definity.  Jessica Parks 06/12/2018, 11:32 AM

## 2018-06-12 NOTE — Progress Notes (Signed)
CRITICAL VALUE ALERT  Critical Value:  Trop 2.93   Date & Time Notied:  06/11/2018 2329   Provider Notified: elink CCM MD made aware  Orders Received/Actions taken: continue current plan of care

## 2018-06-12 NOTE — Progress Notes (Signed)
Called to room by PT. Pt attempting to sit on side of bed for therapy, became weak/dizzy/diaphoretic. On entry pt was being returned to recumbent position, noted pt was minimally responsive, but could open her eyes. Once she was lying down she spoke to me and was orientex x4. Denied CP, SOB. Endorsed overall weakness/clammy feeling. HR noted to be in 70s- SR maintained. SBP 60's. Placed on 3L [place] as sats dropped to 86% on RA. Pt slowly trended sats to 90s. Notified Dr. Vaughan Browner, started 52mL NS bolus as ordered. Lung sounds clear.

## 2018-06-12 NOTE — Plan of Care (Signed)

## 2018-06-12 NOTE — Progress Notes (Signed)
Pt believes recent episode d/t Oxycodone. Reports pain was not well-controlled. Discussed with Dr. Vaughan Browner.

## 2018-06-12 NOTE — Progress Notes (Signed)
NAME:  Jessica Parks, MRN:  [id number], DOB:  March 26, 1966, LOS: 1 ADMISSION DATE:  06/11/2018, CONSULTATION DATE:  06/11/18 REFERRING MD:  Dr. Erlinda Hong, CHIEF COMPLAINT:  Respiratory Insufficiency    Brief History   53 y/o F admitted for planned left shoulder arthroscopy for lysis of adhesions.  Intraoperative course complicated by hypertensive emergency with EKG changes and pulmonary edema.  Left intubated post-operatively and returned to ICU.   History of present illness   53 y/o F admitted 3/6 for planned arthroscopic left shoulder lysis of adhesions.  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  Per report, the patient underwent shoulder injection at the beginning of the case.  During the operative period, it was noted that she was hypertensive and tachycardic.  She was treated with 200 mg esmolol throughout the case.  She was noted to have EKG changes with ST elevation.  The patient was turned and noted to have pink frothy sputum in ETT.  Central line placed for IV access.  Per report, while assessing the patient it was discovered that the block concentration of epinephrine was an incorrect ratio.  She was left intubated and transferred to ICU for further care.  BP soft normal on arrival to ICU.  Past Medical History  Arthritis  Anxiety disorder Left frozen shoulder  Hemorrhoids  Significant Hospital Events   3/06  Admit for left shoulder surgery, left intubated post-operatively   Consults:  PCCM  Cardiology   Procedures:  ETT 3/6 >> 3/6 R IJ TLC (anesthesia) 3/6 >> Significant Diagnostic Tests:  CT Head 3/6 >> Neg  MRI Brain 3/7 >>Normal Brain  Micro Data:    Antimicrobials:    Interim history/subjective:  Extubated overnight to 2l/m . Doing well with incentive spirometry  Doing well this am. Some scratchy throat. ST in this am-passed swallow eval.  CT head and MRI brain normal  B/p soft normal this am -improving    Objective   Blood  pressure (!) 81/64, pulse 99, temperature 98.7 F (37.1 C), temperature source Oral, resp. rate (!) 27, height 5\' 6"  (1.676 m), weight 73 kg, SpO2 93 %. CVP:  [2 mmHg-4 mmHg] 4 mmHg  Vent Mode: CPAP;PSV FiO2 (%):  [40 %-100 %] 40 % Set Rate:  [24 bmp] 24 bmp Vt Set:  [470 mL] 470 mL PEEP:  [5 cmH20] 5 cmH20 Pressure Support:  [5 cmH20] 5 cmH20 Plateau Pressure:  [15 cmH20-17 cmH20] 15 cmH20   Intake/Output Summary (Last 24 hours) at 06/12/2018 2876 Last data filed at 06/12/2018 0800 Gross per 24 hour  Intake 3552.09 ml  Output 3060 ml  Net 492.09 ml   Filed Weights   06/02/18 1340 06/11/18 1042  Weight: 73.5 kg 73 kg    Examination: General: Adult female sitting up in bed no acute distress HENT: MM pink/moist   Lungs: Clear to auscultation bilaterally  Cardiovascular: S1-S2, no MRG  Abdomen: Soft, nontender bowel sounds positive  Extremities: Warm dry, no edema, left shoulder sling   Neuro: Alert and oriented x3  Resolved Hospital Problem list     Assessment & Plan:   Hypertensive Emergency / Iatrogenic Acute Hypertension -resolved -CT /MRI brain neg  Hypotension-mild    P: Hypertension resolved, BP soft- improving   ICU monitoring     Sinus Tachycardia Reported EKG Changes Intra-Operative  Elevated Troponin  Initial Echo with decreased EF 35% Nilda Riggs Takotsubo Syndrome from Epinephrine overdose  P: Tele monitoring  Cardiology following  Repeat Echo  pending   Acute Respiratory Insufficiency -resolved extubated 3/6  -in setting of Hypertensive Emergency  Acute Respiratory Acidosis -resolved  CXR 3/7 much improved with near resolution of pulmonary infiltrates  P: O2 to keep sats >90% Mobilize per Primary/Ortho IS      Left Shoulder Arthroscopy with Lysis of Adhesions  P: Post operative care per Ortho   Hypokalemia-replaced  Hypomagnesium -replaced  AG Metabolic Acidosis   At Risk AKI  P: Trend BMP / urinary output Replace electrolytes as  indicated, replace K+ 3/6 Avoid nephrotoxic agents, ensure adequate renal perfusion NS at 93ml/hr  Check bmet and Mg in am    Anemia -no baseline Hgb P: Follow up PM H/H CBC in am    Hyperglycemia -likely stress response   P: SSI sensitive scale    Best practice:  Diet: Reg  Pain/Anxiety/Delirium protocol (if indicated): PRN fentanyl,   VAP protocol (if indicated): ordered  DVT prophylaxis: Lovenox  GI prophylaxis: PPI  Glucose control: n/a  Mobility: bedrest  Code Status: Full Code  Family Communication: pt /family updated  Disposition: ICU   Labs   CBC: Recent Labs  Lab 06/11/18 1445 06/11/18 1801 06/11/18 1818 06/11/18 2232 06/12/18 0546  WBC 8.1  --  16.9*  --  14.0*  NEUTROABS 4.7  --   --   --   --   HGB 9.7* 10.9* 11.1* 10.9* 10.3*  HCT 32.0* 32.0* 36.1 35.8* 33.6*  MCV 87.4  --  89.1  --  87.3  PLT 194  --  231  --  119    Basic Metabolic Panel: Recent Labs  Lab 06/11/18 1445 06/11/18 1801 06/11/18 1818 06/11/18 2232 06/12/18 0546  NA 142 140  --   --  138  K 2.4* 3.1*  --   --  4.4  CL 105  --   --   --  107  CO2 22  --   --   --  22  GLUCOSE 336*  --   --   --  143*  BUN 8  --   --   --  10  CREATININE 1.13*  --  1.13*  --  0.96  CALCIUM 7.6*  --   --   --  8.4*  MG  --   --   --  1.7  --    GFR: Estimated Creatinine Clearance: 70.1 mL/min (by C-G formula based on SCr of 0.96 mg/dL). Recent Labs  Lab 06/11/18 1445 06/11/18 1818 06/12/18 0546  WBC 8.1 16.9* 14.0*    Liver Function Tests: Recent Labs  Lab 06/11/18 1445  AST 23  ALT 13  ALKPHOS 40  BILITOT 0.3  PROT 4.9*  ALBUMIN 2.5*   No results for input(s): LIPASE, AMYLASE in the last 168 hours. No results for input(s): AMMONIA in the last 168 hours.  ABG    Component Value Date/Time   PHART 7.359 06/11/2018 1801   PCO2ART 44.5 06/11/2018 1801   PO2ART 234.0 (H) 06/11/2018 1801   HCO3 25.2 06/11/2018 1801   TCO2 27 06/11/2018 1801   ACIDBASEDEF 1.0 06/11/2018  1801   O2SAT 100.0 06/11/2018 1801     Coagulation Profile: Recent Labs  Lab 06/11/18 1445  INR 1.2    Cardiac Enzymes: Recent Labs  Lab 06/11/18 1445 06/11/18 1818 06/11/18 2232 06/12/18 0546  CKTOTAL 84  --   --   --   CKMB 3.2  --   --   --   TROPONINI 0.47* 1.58* 2.93*  2.59*    HbA1C: No results found for: HGBA1C  CBG: Recent Labs  Lab 06/11/18 2020 06/11/18 2326 06/12/18 0340  GLUCAP 256* 257* 137*     Critical care time:     Layton Tappan NP-C  [facility]  [phone]   06/12/2018, 8:21 AM

## 2018-06-12 NOTE — Progress Notes (Signed)
Pt reporting 8/10 shoulder pain that is inhibiting movement. Dr. Phoebe Sharps answering service paged.

## 2018-06-12 NOTE — Progress Notes (Addendum)
Progress Note  Patient Name: Jessica Parks Date of Encounter: 06/12/2018  Primary Cardiologist: Dr. Mertie Moores  Subjective   Patient extubated, is awake and interactive this morning.  She denies any chest pain or shortness of breath.  Has had some mild paresthesias in her left hand which are improving and she is moving her fingers normally.  Inpatient Medications    Scheduled Meds: . Chlorhexidine Gluconate Cloth  6 each Topical Daily  . enoxaparin (LOVENOX) injection  40 mg Subcutaneous Q24H  . insulin aspart  0-9 Units Subcutaneous Q4H  . mouth rinse  15 mL Mouth Rinse BID  . pantoprazole (PROTONIX) IV  40 mg Intravenous Q24H  . sodium chloride flush  10-40 mL Intracatheter Q12H   Continuous Infusions: . sodium chloride Stopped (06/12/18 4098)  . phenylephrine (NEO-SYNEPHRINE) Adult infusion     PRN Meds: sodium chloride flush   Vital Signs    Vitals:   06/12/18 0600 06/12/18 0615 06/12/18 0655 06/12/18 0656  BP: 93/66     Pulse: (!) 132 (!) 101    Resp: 16 (!) 24    Temp:      TempSrc:      SpO2: 96% 94% (!) 88% 93%  Weight:      Height:        Intake/Output Summary (Last 24 hours) at 06/12/2018 0757 Last data filed at 06/12/2018 0700 Gross per 24 hour  Intake 3535.16 ml  Output 2460 ml  Net 1075.16 ml   Filed Weights   06/02/18 1340 06/11/18 1042  Weight: 73.5 kg 73 kg    Telemetry    Sinus tachycardia.  Personally reviewed.  ECG    Tracing from 06/12/2018 shows sinus tachycardia with leftward axis and nonspecific T wave changes.  Personally reviewed.  Physical Exam   GEN: No acute distress.   Neck: No JVD. Cardiac: RRR, no rub or gallop.  Respiratory: Nonlabored. Clear to auscultation bilaterally. GI: Soft, nontender, bowel sounds present. MS:  Left shoulder dressed with arm in sling. Neuro:   Moves all extremities. Psych: Alert and oriented x 3. Normal affect.  Labs    Chemistry Recent Labs  Lab 06/11/18 1445 06/11/18 1801  06/11/18 1818 06/12/18 0546  NA 142 140  --  138  K 2.4* 3.1*  --  4.4  CL 105  --   --  107  CO2 22  --   --  22  GLUCOSE 336*  --   --  143*  BUN 8  --   --  10  CREATININE 1.13*  --  1.13* 0.96  CALCIUM 7.6*  --   --  8.4*  PROT 4.9*  --   --   --   ALBUMIN 2.5*  --   --   --   AST 23  --   --   --   ALT 13  --   --   --   ALKPHOS 40  --   --   --   BILITOT 0.3  --   --   --   GFRNONAA 56*  --  56* >60  GFRAA >60  --  >60 >60  ANIONGAP 15  --   --  9     Hematology Recent Labs  Lab 06/11/18 1445  06/11/18 1818 06/11/18 2232 06/12/18 0546  WBC 8.1  --  16.9*  --  14.0*  RBC 3.66*  --  4.05  --  3.85*  HGB 9.7*   < > 11.1*  10.9* 10.3*  HCT 32.0*   < > 36.1 35.8* 33.6*  MCV 87.4  --  89.1  --  87.3  MCH 26.5  --  27.4  --  26.8  MCHC 30.3  --  30.7  --  30.7  RDW 11.8  --  11.9  --  12.0  PLT 194  --  231  --  186   < > = values in this interval not displayed.    Cardiac Enzymes Recent Labs  Lab 06/11/18 1445 06/11/18 1818 06/11/18 2232 06/12/18 0546  TROPONINI 0.47* 1.58* 2.93* 2.59*   No results for input(s): TROPIPOC in the last 168 hours.    Radiology    Ct Head Wo Contrast  Result Date: 06/11/2018 CLINICAL DATA:  Ct head WO, Hypertensive emergency, r/o CVA / ICH EXAM: CT HEAD WITHOUT CONTRAST TECHNIQUE: Contiguous axial images were obtained from the base of the skull through the vertex without intravenous contrast. COMPARISON:  None. FINDINGS: Brain: No evidence of acute infarction, hemorrhage, hydrocephalus, extra-axial collection or mass lesion/mass effect. Vascular: No hyperdense vessel or unexpected calcification. Skull: Normal. Negative for fracture or focal lesion. Sinuses/Orbits: No acute finding. Other: A small amount of fluid layers within the posterior nasopharynx. IMPRESSION: Negative exam. Electronically Signed   By: Nolon Nations M.D.   On: 06/11/2018 16:52   Mr Brain Wo Contrast  Result Date: 06/12/2018 CLINICAL DATA:  Altered level of  consciousness, improved EXAM: MRI HEAD WITHOUT CONTRAST TECHNIQUE: Multiplanar, multiecho pulse sequences of the brain and surrounding structures were obtained without intravenous contrast. COMPARISON:  Head CT from yesterday FINDINGS: Brain: No infarction, hemorrhage, hydrocephalus, extra-axial collection or mass lesion. No white matter disease or atrophy Vascular: Major flow voids are preserved Skull and upper cervical spine: Negative for marrow lesion Sinuses/Orbits: Negative IMPRESSION: Normal brain MRI. Electronically Signed   By: Monte Fantasia M.D.   On: 06/12/2018 07:29   Dg Chest Port 1 View  Result Date: 06/11/2018 CLINICAL DATA:  Intubation. EXAM: PORTABLE CHEST 1 VIEW COMPARISON:  08/06/2015 FINDINGS: Endotracheal tube tip projects 4.8 cm above the carina. Right internal jugular central venous line tip projects either in the central internal jugular vein or at the level of the internal jugular vein, superior vena cava confluence. No pneumothorax. There is extensive patchy consolidation throughout much of the right lung most prominently in the central and lower right lung. There is additional opacity at the left medial lung base which may reflect additional infection or atelectasis. Cardiac silhouette is normal in size. IMPRESSION: 1. Endotracheal tube tip 4.8 cm above the carina. 2. Right internal jugular central venous line tip at or just above the internal jugular superior vena cava confluence. 3. No pneumothorax. 4. Multifocal right lung pneumonia. Additional pneumonia versus atelectasis at the posteromedial left lung base. Electronically Signed   By: Lajean Manes M.D.   On: 06/11/2018 16:38    Cardiac Studies   None prior.  Patient Profile     53 y.o. 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  Patient developed hypertensive emergency intraoperatively in the setting of  inadvertent overdose of epinephrine used in regional block.  Cardiology consulted due to hemodynamic changes and ECG abnormalities.  Assessment & Plan    1.  Elevated troponin I with peak of 2.93 and subsequent downward trend, most likely type II NSTEMI in the setting of hypertension and tachycardia.  Follow-up ECGs show nondiagnostic  ST-T wave changes.  She does not report any chest pain this morning.  Bedside echocardiogram was obtained yesterday, no report available for review.  I did review the images which are suboptimal, and it is difficult to get an accurate sense of LVEF and wall motion.  2.  Slow recovery after anesthesia.  Patient is now extubated and communicating easily this morning.  Brain MRI obtained today was normal.  Neurology has evaluated the patient and signed off for now.  I reviewed the chart and cardiology consultation by Dr. Acie Fredrickson from yesterday.  Also spoke with nursing and pharmacy this morning.  Plan to change serial ECGs to once daily unless there are interval clinical cardiac symptoms.  Follow-up troponin I tomorrow to ensure continued downward trend.  She needs a complete echocardiogram, potentially with Definity contrast to get a better evaluation of cardiac structure and function.  With potential LV dysfunction that could be stress-induced, would generally consider starting her on Coreg, however the current blood pressure precludes this.  Would not heparinize for the troponin I levels as this is most likely type II event rather than primary ACS.  She will still need follow-up ischemic assessment ultimately.  Continue to follow in unit.  Signed, Rozann Lesches, MD  06/12/2018, 7:57 AM

## 2018-06-12 NOTE — Progress Notes (Signed)
Latest Trop and updated assessment called to Dr. Georgette Shell as well, continue current plan of care

## 2018-06-12 NOTE — Progress Notes (Signed)
Pt transport to MRI without difficulty, pt remained stable.  0881 pt transported back to 2H04 without difficulty

## 2018-06-12 NOTE — Progress Notes (Signed)
Pt continues to have severe pain L shoulder. Site assessed, no evidence of necrosis/edema/change in drainage. Dr. Vaughan Browner notified, IV Toradol given as ordered.

## 2018-06-12 NOTE — Evaluation (Signed)
Clinical/Bedside Swallow Evaluation Patient Details  Name: Jessica Parks MRN: [id number] Date of Birth: 1965/11/13  Today's Date: 06/12/2018 Time: SLP Start Time (ACUTE ONLY): 0815 SLP Stop Time (ACUTE ONLY): 0828 SLP Time Calculation (min) (ACUTE ONLY): 13 min  Past Medical History:  Past Medical History:  Diagnosis Date  . Anemia   . Anxiety disorder   . Arthritis   . Frozen shoulder    left- PT only 04/28/2018  . Hemorrhoids    Past Surgical History:  Past Surgical History:  Procedure Laterality Date  . BREAST BIOPSY Left 2014   benign  . TUBAL LIGATION     HPI:  Pt is a 53 yo female admitted for left shoulder arthroscopy for lysis of adhesions.  Intraoperative course complicated by hypertensive emergency with EKG changes and pulmonary edema. She was intubated 3/6 for her surgery and remained intubated briefly post-procedure, extubated later that evening. MRI brain negative for acute changes. PMH: frozen shoulder, arthritis, anxiety   Assessment / Plan / Recommendation Clinical Impression  Pt's oropharyngeal swallow appears functional. Her age and brief length of intubation are good prognostic indicators for return to PO diet. She believes her voice is minimally different from baseline, and her husband is in agreement. Recommend starting regualr diet and thin liquids. Please reorder SLP if there are any acute concerns. SLP Visit Diagnosis: Dysphagia, unspecified (R13.10)    Aspiration Risk  Mild aspiration risk    Diet Recommendation Regular;Thin liquid   Liquid Administration via: Cup;Straw Medication Administration: Whole meds with liquid Supervision: Patient able to self feed;Intermittent supervision to cue for compensatory strategies Compensations: Slow rate;Small sips/bites Postural Changes: Seated upright at 90 degrees    Other  Recommendations Oral Care Recommendations: Oral care BID   Follow up Recommendations None      Frequency and Duration             Prognosis Prognosis for Safe Diet Advancement: Good      Swallow Study   General HPI: Pt is a 53 yo female admitted for left shoulder arthroscopy for lysis of adhesions.  Intraoperative course complicated by hypertensive emergency with EKG changes and pulmonary edema. She was intubated 3/6 for her surgery and remained intubated briefly post-procedure, extubated later that evening. MRI brain negative for acute changes. PMH: frozen shoulder, arthritis, anxiety Type of Study: Bedside Swallow Evaluation Previous Swallow Assessment: none in chart Diet Prior to this Study: NPO Temperature Spikes Noted: No Respiratory Status: Nasal cannula History of Recent Intubation: Yes Length of Intubations (days): 1 days Date extubated: 06/11/18 Behavior/Cognition: Alert;Cooperative;Pleasant mood Oral Cavity Assessment: Within Functional Limits Oral Care Completed by SLP: No Oral Cavity - Dentition: Adequate natural dentition Vision: Functional for self-feeding Self-Feeding Abilities: Able to feed self Patient Positioning: Upright in bed Baseline Vocal Quality: Normal Volitional Cough: Strong Volitional Swallow: Able to elicit    Oral/Motor/Sensory Function Overall Oral Motor/Sensory Function: Within functional limits   Ice Chips Ice chips: Not tested   Thin Liquid Thin Liquid: Within functional limits Presentation: Self Fed;Straw    Nectar Thick Nectar Thick Liquid: Not tested   Honey Thick Honey Thick Liquid: Not tested   Puree Puree: Within functional limits Presentation: Self Fed;Spoon   Solid     Solid: Within functional limits Presentation: [place] 06/12/2018,8:38 AM  Pollyann Glen, M.A. [place] Acute Environmental education officer [phone] Office [phone]

## 2018-06-13 DIAGNOSIS — I214 Non-ST elevation (NSTEMI) myocardial infarction: Secondary | ICD-10-CM

## 2018-06-13 LAB — CBC
HCT: 29 % — ABNORMAL LOW (ref 36.0–46.0)
Hemoglobin: 8.6 g/dL — ABNORMAL LOW (ref 12.0–15.0)
MCH: 26.5 pg (ref 26.0–34.0)
MCHC: 29.7 g/dL — ABNORMAL LOW (ref 30.0–36.0)
MCV: 89.5 fL (ref 80.0–100.0)
Platelets: 143 10*3/uL — ABNORMAL LOW (ref 150–400)
RBC: 3.24 MIL/uL — AB (ref 3.87–5.11)
RDW: 12.1 % (ref 11.5–15.5)
WBC: 9.8 10*3/uL (ref 4.0–10.5)
nRBC: 0 % (ref 0.0–0.2)

## 2018-06-13 LAB — TROPONIN I: Troponin I: 0.79 ng/mL (ref <0.03)

## 2018-06-13 LAB — GLUCOSE, CAPILLARY
GLUCOSE-CAPILLARY: 113 mg/dL — AB (ref 70–99)
Glucose-Capillary: 112 mg/dL — ABNORMAL HIGH (ref 70–99)
Glucose-Capillary: 104 mg/dL — ABNORMAL HIGH (ref 70–99)
Glucose-Capillary: 189 mg/dL — ABNORMAL HIGH (ref 70–99)

## 2018-06-13 LAB — BASIC METABOLIC PANEL
Anion gap: 7 (ref 5–15)
BUN: 8 mg/dL (ref 6–20)
CO2: 23 mmol/L (ref 22–32)
Calcium: 8.1 mg/dL — ABNORMAL LOW (ref 8.9–10.3)
Chloride: 109 mmol/L (ref 98–111)
Creatinine, Ser: 0.74 mg/dL (ref 0.44–1.00)
Glucose, Bld: 116 mg/dL — ABNORMAL HIGH (ref 70–99)
Potassium: 3.7 mmol/L (ref 3.5–5.1)
Sodium: 139 mmol/L (ref 135–145)

## 2018-06-13 LAB — MAGNESIUM: Magnesium: 1.8 mg/dL (ref 1.7–2.4)

## 2018-06-13 MED ORDER — MAGNESIUM SULFATE 2 GM/50ML IV SOLN
2.0000 g | Freq: Once | INTRAVENOUS | Status: AC
  Administered 2018-06-13: 2 g via INTRAVENOUS
  Filled 2018-06-13: qty 50

## 2018-06-13 MED ORDER — CARVEDILOL 3.125 MG PO TABS
3.1250 mg | ORAL_TABLET | Freq: Two times a day (BID) | ORAL | Status: DC
  Administered 2018-06-13 – 2018-06-14 (×3): 3.125 mg via ORAL
  Filled 2018-06-13 (×3): qty 1

## 2018-06-13 MED ORDER — ASPIRIN 81 MG PO CHEW
81.0000 mg | CHEWABLE_TABLET | Freq: Every day | ORAL | Status: DC
  Administered 2018-06-13 – 2018-06-14 (×2): 81 mg via ORAL
  Filled 2018-06-13 (×2): qty 1

## 2018-06-13 MED ORDER — ZOLPIDEM TARTRATE 5 MG PO TABS
5.0000 mg | ORAL_TABLET | Freq: Once | ORAL | Status: AC
  Administered 2018-06-13: 5 mg via ORAL
  Filled 2018-06-13: qty 1

## 2018-06-13 MED ORDER — CHLORHEXIDINE GLUCONATE CLOTH 2 % EX PADS
6.0000 | MEDICATED_PAD | Freq: Every day | CUTANEOUS | Status: DC
  Administered 2018-06-14: 6 via TOPICAL

## 2018-06-13 NOTE — Progress Notes (Signed)
Progress Note  Patient Name: Jessica Parks Date of Encounter: 06/13/2018  Primary Cardiologist: Dr. Mertie Moores  Subjective   Patient awake and alert this morning.  States that she has had some trouble sleeping.  Reports adequate left shoulder pain control.  No chest pain or shortness of breath.  Inpatient Medications    Scheduled Meds: . Chlorhexidine Gluconate Cloth  6 each Topical Daily  . enoxaparin (LOVENOX) injection  40 mg Subcutaneous Q24H  . insulin aspart  0-9 Units Subcutaneous TID WC  . mouth rinse  15 mL Mouth Rinse BID  . sodium chloride flush  10-40 mL Intracatheter Q12H   Continuous Infusions: . sodium chloride 75 mL/hr at 06/13/18 0700  . phenylephrine (NEO-SYNEPHRINE) Adult infusion     PRN Meds: ketorolac, sodium chloride flush, traMADol   Vital Signs    Vitals:   06/13/18 0500 06/13/18 0600 06/13/18 0700 06/13/18 0724  BP: 102/78 106/70 112/71   Pulse: 85 82 95   Resp: 18 (!) 22 (!) 27   Temp:    98.6 F (37 C)  TempSrc:    Oral  SpO2: 97% 96% 97%   Weight:  77.6 kg    Height:        Intake/Output Summary (Last 24 hours) at 06/13/2018 0801 Last data filed at 06/13/2018 0700 Gross per 24 hour  Intake 2862.42 ml  Output 3550 ml  Net -687.58 ml   Filed Weights   06/02/18 1340 06/11/18 1042 06/13/18 0600  Weight: 73.5 kg 73 kg 77.6 kg    Telemetry    Sinus rhythm and sinus tachycardia.  Personally reviewed.  ECG    Tracing from 06/13/2018 shows sinus rhythm with leftward axis and prolonged QT interval.  Personally reviewed.  Physical Exam   GEN: No acute distress.   Neck: No JVD. Cardiac: RRR, no gallop or rub.  Respiratory: Nonlabored. Clear to auscultation bilaterally. GI: Soft, nontender, bowel sounds present. MS:  Left arm in sling, no peripheral edema lower legs. Neuro:  Nonfocal. Psych: Alert and oriented x 3. Normal affect.  Labs    Chemistry Recent Labs  Lab 06/11/18 1445 06/11/18 1801 06/11/18 1818 06/12/18 0546  06/13/18 0458  NA 142 140  --  138 139  K 2.4* 3.1*  --  4.4 3.7  CL 105  --   --  107 109  CO2 22  --   --  22 23  GLUCOSE 336*  --   --  143* 116*  BUN 8  --   --  10 8  CREATININE 1.13*  --  1.13* 0.96 0.74  CALCIUM 7.6*  --   --  8.4* 8.1*  PROT 4.9*  --   --   --   --   ALBUMIN 2.5*  --   --   --   --   AST 23  --   --   --   --   ALT 13  --   --   --   --   ALKPHOS 40  --   --   --   --   BILITOT 0.3  --   --   --   --   GFRNONAA 56*  --  56* >60 >60  GFRAA >60  --  >60 >60 >60  ANIONGAP 15  --   --  9 7     Hematology Recent Labs  Lab 06/11/18 1818 06/11/18 2232 06/12/18 0546 06/13/18 0458  WBC 16.9*  --  14.0* 9.8  RBC 4.05  --  3.85* 3.24*  HGB 11.1* 10.9* 10.3* 8.6*  HCT 36.1 35.8* 33.6* 29.0*  MCV 89.1  --  87.3 89.5  MCH 27.4  --  26.8 26.5  MCHC 30.7  --  30.7 29.7*  RDW 11.9  --  12.0 12.1  PLT 231  --  186 143*    Cardiac Enzymes Recent Labs  Lab 06/11/18 1818 06/11/18 2232 06/12/18 0546 06/13/18 0458  TROPONINI 1.58* 2.93* 2.59* 0.79*   No results for input(s): TROPIPOC in the last 168 hours.    Radiology    Ct Head Wo Contrast  Result Date: 06/11/2018 CLINICAL DATA:  Ct head WO, Hypertensive emergency, r/o CVA / ICH EXAM: CT HEAD WITHOUT CONTRAST TECHNIQUE: Contiguous axial images were obtained from the base of the skull through the vertex without intravenous contrast. COMPARISON:  None. FINDINGS: Brain: No evidence of acute infarction, hemorrhage, hydrocephalus, extra-axial collection or mass lesion/mass effect. Vascular: No hyperdense vessel or unexpected calcification. Skull: Normal. Negative for fracture or focal lesion. Sinuses/Orbits: No acute finding. Other: A small amount of fluid layers within the posterior nasopharynx. IMPRESSION: Negative exam. Electronically Signed   By: Nolon Nations M.D.   On: 06/11/2018 16:52   Mr Brain Wo Contrast  Result Date: 06/12/2018 CLINICAL DATA:  Altered level of consciousness, improved EXAM: MRI HEAD  WITHOUT CONTRAST TECHNIQUE: Multiplanar, multiecho pulse sequences of the brain and surrounding structures were obtained without intravenous contrast. COMPARISON:  Head CT from yesterday FINDINGS: Brain: No infarction, hemorrhage, hydrocephalus, extra-axial collection or mass lesion. No white matter disease or atrophy Vascular: Major flow voids are preserved Skull and upper cervical spine: Negative for marrow lesion Sinuses/Orbits: Negative IMPRESSION: Normal brain MRI. Electronically Signed   By: Monte Fantasia M.D.   On: 06/12/2018 07:29   Dg Chest Port 1 View  Result Date: 06/12/2018 CLINICAL DATA:  Acute respiratory insufficiency EXAM: PORTABLE CHEST 1 VIEW COMPARISON:  06/11/2018 FINDINGS: Endotracheal tube is been removed in the interval. Right jugular line is again seen. Cardiac shadow is stable. Near complete resolution of previously seen multifocal infiltrates is noted. Only minimal residual is seen in the right base. No bony abnormality is noted. IMPRESSION: Significant improved aeration when compared with the previous day. Electronically Signed   By: Inez Catalina M.D.   On: 06/12/2018 08:38   Dg Chest Port 1 View  Result Date: 06/11/2018 CLINICAL DATA:  Intubation. EXAM: PORTABLE CHEST 1 VIEW COMPARISON:  08/06/2015 FINDINGS: Endotracheal tube tip projects 4.8 cm above the carina. Right internal jugular central venous line tip projects either in the central internal jugular vein or at the level of the internal jugular vein, superior vena cava confluence. No pneumothorax. There is extensive patchy consolidation throughout much of the right lung most prominently in the central and lower right lung. There is additional opacity at the left medial lung base which may reflect additional infection or atelectasis. Cardiac silhouette is normal in size. IMPRESSION: 1. Endotracheal tube tip 4.8 cm above the carina. 2. Right internal jugular central venous line tip at or just above the internal jugular  superior vena cava confluence. 3. No pneumothorax. 4. Multifocal right lung pneumonia. Additional pneumonia versus atelectasis at the posteromedial left lung base. Electronically Signed   By: Lajean Manes M.D.   On: 06/11/2018 16:38    Cardiac Studies   Echocardiogram 06/12/2018:  1. The left ventricle has mild-moderately reduced systolic function, with an ejection fraction of 40-45%. The cavity size was normal.  Left ventricular diastolic Doppler parameters are consistent with impaired relaxation.  2. The right ventricle has normal systolic function. The cavity was normal. There is no increase in right ventricular wall thickness.  3. The mitral valve is normal in structure.  4. The tricuspid valve is normal in structure.  5. The aortic valve is normal in structure.  6. The pulmonic valve was normal in structure.  SUMMARY   There is mild LV dysfunction with LVEF 45-50 % and hypokinesis in the basal and mid inferoseptal, anteroseptal and basal anterior walls.  Patient Profile     53 y.o. 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  Patient developed hypertensive emergency intraoperatively in the setting of inadvertent overdose of epinephrine used in regional block.  Cardiology consulted due to hemodynamic changes and ECG abnormalities.  Assessment & Plan    1.  Elevated troponin I with peak of 2.93 consistent with NSTEMI, most likely type II event (demand ischemia) perioperatively in the setting of hypertension and tachycardia.  Follow-up ECG today shows no acute ST segment changes, prolonged QT interval.  Troponin I level today is down to 0.79.  Echocardiogram does confirm evidence of cardiomyopathy however with LVEF approximately 45% and wall motion abnormalities in the mid to basal inferoseptal and anteroseptal walls.  Cardiomyopathy may be related to physiologic stress, although  underlying ischemic heart disease remains a distinct possibility that will need to be investigated.  2.  Blood loss anemia, current hemoglobin 8.6.  3.  Postoperative day #2 status post arthroscopic left shoulder surgery with lysis of adhesions and debridement by Dr. Erlinda Hong.  I discussed the situation with the patient and her husband present.  We went over the results of her echocardiogram indicating evidence of cardiomyopathy.  With current vital signs stabilizing, plan to initiate Coreg beginning at 3.125 mg daily.  Would eventually aim to add low-dose ARB depending on blood pressure.  Although this could be more of a physiologic stress-induced cardiomyopathy, she will need further investigation of potential underlying ischemic heart disease.  Not certain if she could undergo cardiac gated CT with left shoulder immobilized and in sling at this point.  May need to ultimately consider a diagnostic cardiac catheterization.  If cleared by the surgical team, would suggest starting aspirin 81 mg daily as well.  Our cardiology service will continue to follow with you.  Signed, Rozann Lesches, MD  06/13/2018, 8:01 AM

## 2018-06-13 NOTE — Progress Notes (Signed)
NAME:  Jessica Parks, MRN:  [id number], DOB:  [birth year], LOS: 2 ADMISSION DATE:  06/11/2018, CONSULTATION DATE:  06/11/18 REFERRING MD:  Dr. Erlinda Hong, CHIEF COMPLAINT:  Respiratory Insufficiency    Brief History   53 y/o F admitted for planned left shoulder arthroscopy for lysis of adhesions.  Intraoperative course complicated by hypertensive emergency with EKG changes and pulmonary edema.  Left intubated post-operatively and returned to ICU.   History of present illness   53 y/o F admitted 3/6 for planned arthroscopic left shoulder lysis of adhesions.  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  Per report, the patient underwent shoulder injection at the beginning of the case.  During the operative period, it was noted that she was hypertensive and tachycardic.  She was treated with 200 mg esmolol throughout the case.  She was noted to have EKG changes with ST elevation.  The patient was turned and noted to have pink frothy sputum in ETT.  Central line placed for IV access.  Per report, while assessing the patient it was discovered that the block concentration of epinephrine was an incorrect ratio.  She was left intubated and transferred to ICU for further care.  BP soft normal on arrival to ICU.  Past Medical History  Arthritis  Anxiety disorder Left frozen shoulder  Hemorrhoids  Significant Hospital Events   3/06  Admit for left shoulder surgery, left intubated post-operatively   Consults:  PCCM  Cardiology   Procedures:  ETT 3/6 >> 3/6 R IJ TLC (anesthesia) 3/6 >>3/8  Significant Diagnostic Tests:  CT Head 3/6 >> Neg  MRI Brain 3/7 >>Normal Brain  Echo 3/7 >>EF 40-45%, hypokinesis in basal and mid inferoseptal, anteroseptal and basal anterior walls   Micro Data:    Antimicrobials:    Interim history/subjective:  O2 sats good off O2 .  Echo 3/7 with reduced systolic fxn . Troponin tr down .  B/p is improved , good UOP . Remains in neg I/O  bal.  Eating well , has gotten up in room and walked to BR .   Objective   Blood pressure 112/71, pulse 95, temperature 98.6 F (37 C), temperature source Oral, resp. rate (!) 27, height 5\' 6"  (1.676 m), weight 77.6 kg, SpO2 97 %.        Intake/Output Summary (Last 24 hours) at 06/13/2018 0758 Last data filed at 06/13/2018 0700 Gross per 24 hour  Intake 2999.35 ml  Output 3550 ml  Net -550.65 ml   Filed Weights   06/02/18 1340 06/11/18 1042 06/13/18 0600  Weight: 73.5 kg 73 kg 77.6 kg    Examination: General: Adult female sitting up in bed no acute distress HENT: MM pink/moist   Lungs: Clear to auscultation bilaterally  Cardiovascular: S1-S2, no MRG  Abdomen: Soft, nontender bowel sounds positive  Extremities: Warm dry, no edema, left shoulder sling , moving fingers well , normal pulses ,+cap refill  Neuro: Alert and oriented x3  Resolved Hospital Problem list     Assessment & Plan:   Hypertensive Emergency / Iatrogenic Acute Hypertension -resolved -CT /MRI brain neg  Hypotension-mild >resolved    P: Monitor    Sinus Tachycardia Reported EKG Changes Intra-Operative  Elevated Troponin/NSTEMI  -troponin downward trending  Cardiomyopathy -possible stress induced  (echo 3/7 EF 40-45%, hypokinesis )  Plan   Cardiology following  Plans for low dose BB , OP cards follow up .  D/c central line   Acute Respiratory Insufficiency -resolved extubated  3/6  -in setting of Hypertensive Emergency -resolved off o2 3/7  Acute Respiratory Acidosis -resolved  CXR 3/7 much improved with near resolution of pulmonary infiltrates   P: Mobilize per Primary/Ortho IS      Left Shoulder Arthroscopy with Lysis of Adhesions  P: Post operative care per Ortho   Hypokalemia-replaced  Hypomagnesium -replaced  AG Metabolic Acidosis   At Risk AKI  P: Trend BMP / urinary output Replace electrolytes as indicated  Avoid nephrotoxic agents, ensure adequate renal perfusion D/c IVF  Mag  x 1  Check bmet and Mg in am    Anemia -no baseline Hgb P: Follow up PM H/H CBC in am    Hyperglycemia -likely stress response   P: SSI sensitive scale    Best practice:  Diet: Reg  Pain/Anxiety/Delirium protocol (if indicated): PRN fentanyl,   VAP protocol (if indicated):na  DVT prophylaxis: Lovenox  GI prophylaxis: PPI  Glucose control: n/a  Mobility: bedrest  Code Status: Full Code  Family Communication: pt /family updated  Disposition: per Primary , PCCM to sign off   Labs   CBC: Recent Labs  Lab 06/11/18 1445 06/11/18 1801 06/11/18 1818 06/11/18 2232 06/12/18 0546 06/13/18 0458  WBC 8.1  --  16.9*  --  14.0* 9.8  NEUTROABS 4.7  --   --   --   --   --   HGB 9.7* 10.9* 11.1* 10.9* 10.3* 8.6*  HCT 32.0* 32.0* 36.1 35.8* 33.6* 29.0*  MCV 87.4  --  89.1  --  87.3 89.5  PLT 194  --  231  --  186 143*    Basic Metabolic Panel: Recent Labs  Lab 06/11/18 1445 06/11/18 1801 06/11/18 1818 06/11/18 2232 06/12/18 0546 06/13/18 0458  NA 142 140  --   --  138 139  K 2.4* 3.1*  --   --  4.4 3.7  CL 105  --   --   --  107 109  CO2 22  --   --   --  22 23  GLUCOSE 336*  --   --   --  143* 116*  BUN 8  --   --   --  10 8  CREATININE 1.13*  --  1.13*  --  0.96 0.74  CALCIUM 7.6*  --   --   --  8.4* 8.1*  MG  --   --   --  1.7  --  1.8   GFR: Estimated Creatinine Clearance: 86.5 mL/min (by C-G formula based on SCr of 0.74 mg/dL). Recent Labs  Lab 06/11/18 1445 06/11/18 1818 06/12/18 0546 06/13/18 0458  WBC 8.1 16.9* 14.0* 9.8    Liver Function Tests: Recent Labs  Lab 06/11/18 1445  AST 23  ALT 13  ALKPHOS 40  BILITOT 0.3  PROT 4.9*  ALBUMIN 2.5*   No results for input(s): LIPASE, AMYLASE in the last 168 hours. No results for input(s): AMMONIA in the last 168 hours.  ABG    Component Value Date/Time   PHART 7.359 06/11/2018 1801   PCO2ART 44.5 06/11/2018 1801   PO2ART 234.0 (H) 06/11/2018 1801   HCO3 25.2 06/11/2018 1801   TCO2 27  06/11/2018 1801   ACIDBASEDEF 1.0 06/11/2018 1801   O2SAT 100.0 06/11/2018 1801     Coagulation Profile: Recent Labs  Lab 06/11/18 1445  INR 1.2    Cardiac Enzymes: Recent Labs  Lab 06/11/18 1445 06/11/18 1818 06/11/18 2232 06/12/18 0546 06/13/18 0458  CKTOTAL 84  --   --   --   --  CKMB 3.2  --   --   --   --   TROPONINI 0.47* 1.58* 2.93* 2.59* 0.79*    HbA1C: No results found for: HGBA1C  CBG: Recent Labs  Lab 06/12/18 0757 06/12/18 1133 06/12/18 1556 06/12/18 2110 06/13/18 0646  GLUCAP 102* 143* 120* 131* 112*     Critical care time:     Ether Wolters NP-C  [facility]  [phone]   06/13/2018, 7:58 AM

## 2018-06-13 NOTE — Progress Notes (Signed)
Jessica Parks is doing well this morning.  No issues.  Left shoulder pain is moderate now. She may begin aspirin 81 mg daily which I have ordered. Surgical dressings were changed today. Plan to d/c central line this morning and transfer to floor.   Likely home tomorrow and begin outpatient shoulder PT upon discharge

## 2018-06-13 NOTE — Progress Notes (Signed)
Primary RN aware of order to discontinue central line and will follow up.

## 2018-06-13 NOTE — Plan of Care (Signed)
  Problem: Education: Goal: Knowledge of General Education information will improve Description Including pain rating scale, medication(s)/side effects and non-pharmacologic comfort measures Outcome: Progressing   Problem: Health Behavior/Discharge Planning: Goal: Ability to manage health-related needs will improve Outcome: Progressing   Problem: Clinical Measurements: Goal: Will remain free from infection Outcome: Progressing   Problem: Activity: Goal: Risk for activity intolerance will decrease Outcome: Progressing   Problem: Nutrition: Goal: Adequate nutrition will be maintained Outcome: Progressing   Problem: Coping: Goal: Level of anxiety will decrease Outcome: Progressing Note:  Pt is more discouraged and anxious tonight as the events of her hospitalization sink in, spoke with and encouraged her.  Pt has not slept well the last two nights, attempting to promote good sleep tonight   Problem: Elimination: Goal: Will not experience complications related to bowel motility Outcome: Progressing Note:  No post op BM yet   Problem: Pain Managment: Goal: General experience of comfort will improve Outcome: Progressing   Problem: Safety: Goal: Ability to remain free from injury will improve Outcome: Progressing

## 2018-06-13 NOTE — Evaluation (Signed)
Occupational Therapy Evaluation and Discharge Patient Details Name: Jessica Parks MRN: [id number] DOB: 13-Apr-1965 Today's Date: 06/13/2018    History of Present Illness Pt is a 53 yo female admitted 06/11/18 for left shoulder arthroscopy for lysis of adhesions. Intraoperative course complicated by hypertensive emergency with EKG changes and pulmonary edema. Pt intubated 3/6; extubated later same evening. MRI brain negative for acute changes. PMH includes frozen shoulder, arthritis, anxiety.   Clinical Impression   PTA Pt independent in ADL and mobility. Looking forward to hopefully getting back to tennis. Pt today was independent in transfers. Educated on compensatory strategies for dressing/bathing, and educated on sling management (Medium was too small - requested large from RN). Educated Pt that she had no ROM or WB restrictions - able to complete hand, wrist, elbow and pendulums EOB. Pt's sister present throughout session. Will defer further therapy to MD and OPPT (which had been pre-arranged - Pt has a PT that she's been working with and they have good rapport). OT to sign off acutely.   VSS throughout session.    Follow Up Recommendations  Follow surgeon's recommendation for DC plan and follow-up therapies    Equipment Recommendations  None recommended by OT    Recommendations for Other Services       Precautions / Restrictions Precautions Precautions: Fall Required Braces or Orthoses: Sling Restrictions Weight Bearing Restrictions: No Other Position/Activity Restrictions: Per Dr. Erlinda Hong, no LUE WB restrictions, ROM as tolerated      Mobility Bed Mobility Overal bed mobility: Independent                Transfers Overall transfer level: Independent                    Balance Overall balance assessment: No apparent balance deficits (not formally assessed)                                         ADL either performed or assessed with clinical  judgement   ADL                                         General ADL Comments: see shoulder section below, able to transfer independently     Vision Patient Visual Report: No change from baseline       Perception     Praxis      Pertinent Vitals/Pain Pain Assessment: 0-10 Pain Score: 4  Pain Location: LUE Pain Descriptors / Indicators: Guarding;Grimacing Pain Intervention(s): Monitored during session;Repositioned     Hand Dominance     Extremity/Trunk Assessment Upper Extremity Assessment Upper Extremity Assessment: LUE deficits/detail LUE Deficits / Details: performed pendulums seated EOB, educated that there are no ROM limitations; edcuated on elbow, wrist, hand exercises for edema management LUE: Unable to fully assess due to pain LUE Sensation: WNL LUE Coordination: decreased fine motor;decreased gross motor   Lower Extremity Assessment Lower Extremity Assessment: Overall WFL for tasks assessed   Cervical / Trunk Assessment Cervical / Trunk Assessment: Normal   Communication Communication Communication: No difficulties   Cognition Arousal/Alertness: Awake/alert Behavior During Therapy: WFL for tasks assessed/performed Overall Cognitive Status: Within Functional Limits for tasks assessed  General Comments  sister present throughout session    Exercises Exercises: Shoulder Shoulder Exercises Pendulum Exercise: AROM;Left;10 reps;Seated Elbow Flexion: AROM;Left;10 reps;Seated Elbow Extension: AROM;Left;Seated Wrist Flexion: AROM;Left;Seated Wrist Extension: AROM;Left;Seated Digit Composite Flexion: AROM;Left Composite Extension: AROM;Left   Shoulder Instructions Shoulder Instructions Donning/doffing shirt without moving shoulder: Minimal assistance(ok for ROM - educated to dress LUE first) Method for sponge bathing under operated UE: Independent Donning/doffing sling/immobilizer:  Supervision/safety Correct positioning of sling/immobilizer: Modified independent Pendulum exercises (written home exercise program): Independent ROM for elbow, wrist and digits of operated UE: Independent Sling wearing schedule (on at all times/off for ADL's): Independent(for comfort) Positioning of UE while sleeping: [place] expects to be discharged to:: Private residence Living Arrangements: Spouse/significant other Available Help at Discharge: Family;Available 24 hours/day Type of Home: House Home Access: Stairs to enter [employer] of Steps: 2 Entrance Stairs-Rails: None Home Layout: Two level;Bed/bath upstairs Alternate Level Stairs-Number of Steps: Flight Alternate Level Stairs-Rails: Right           Home Equipment: None   Additional Comments: Husband will be available for initial 24/7 support      Prior Functioning/Environment Level of Independence: Independent        Comments: 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        OT Problem List: Decreased range of motion;Decreased strength;Impaired UE functional use;Pain;Increased edema      OT Treatment/Interventions:      OT Goals(Current goals can be found in the care plan section) Acute Rehab OT Goals Patient Stated Goal: get back to playing tennis OT Goal Formulation: With patient Time For Goal Achievement: 06/27/18 Potential to Achieve Goals: Good  OT Frequency:     Barriers to D/C:            Co-evaluation              AM-PAC OT "6 Clicks" Daily Activity     Outcome Measure Help from another person eating meals?: A Little Help from another person taking care of personal grooming?: A Little Help from another person toileting, which includes using toliet, bedpan, or urinal?: None Help from another person bathing (including washing, rinsing, drying)?: A Little Help from another person to put on and taking off regular upper body clothing?: A Lot Help from another person to  put on and taking off regular lower body clothing?: A Little 6 Click Score: 18   End of Session Equipment Utilized During Treatment: Other (comment)(sling) Nurse Communication: Mobility status;Other (comment)(pt requests decrease in frquency of BP, and rest, L sling)  Activity Tolerance: Patient tolerated treatment well Patient left: in bed;with call bell/phone within reach;with family/visitor present  OT Visit Diagnosis: Pain Pain - Right/Left: Left Pain - part of body: Shoulder                Time: 1220-1240 OT Time Calculation (min): 20 min Charges:  OT General Charges $OT Visit: 1 Visit OT Evaluation $OT Eval Moderate Complexity: [place] OTR/L Acute Rehabilitation Services Pager: [phone] Office: [place] 06/13/2018, 1:10 PM

## 2018-06-14 ENCOUNTER — Encounter (HOSPITAL_BASED_OUTPATIENT_CLINIC_OR_DEPARTMENT_OTHER): Payer: Self-pay | Admitting: Orthopaedic Surgery

## 2018-06-14 DIAGNOSIS — I248 Other forms of acute ischemic heart disease: Secondary | ICD-10-CM

## 2018-06-14 LAB — BASIC METABOLIC PANEL
Anion gap: 9 (ref 5–15)
BUN: 7 mg/dL (ref 6–20)
CALCIUM: 8.8 mg/dL — AB (ref 8.9–10.3)
CO2: 24 mmol/L (ref 22–32)
Chloride: 104 mmol/L (ref 98–111)
Creatinine, Ser: 0.77 mg/dL (ref 0.44–1.00)
GFR calc Af Amer: >60 mL/min (ref >60)
Glucose, Bld: 132 mg/dL — ABNORMAL HIGH (ref 70–99)
Potassium: 3.7 mmol/L (ref 3.5–5.1)
Sodium: 137 mmol/L (ref 135–145)

## 2018-06-14 LAB — GLUCOSE, CAPILLARY: Glucose-Capillary: 112 mg/dL — ABNORMAL HIGH (ref 70–99)

## 2018-06-14 LAB — MAGNESIUM: Magnesium: 1.9 mg/dL (ref 1.7–2.4)

## 2018-06-14 MED ORDER — ASPIRIN 81 MG PO CHEW: 81.0000 mg | CHEWABLE_TABLET | Freq: Every day | ORAL | 2 refills | Status: DC

## 2018-06-14 MED ORDER — ATORVASTATIN CALCIUM 10 MG PO TABS: 20.0000 mg | ORAL_TABLET | Freq: Every day | ORAL | Status: DC

## 2018-06-14 MED ORDER — CARVEDILOL 3.125 MG PO TABS: 3.1250 mg | ORAL_TABLET | Freq: Two times a day (BID) | ORAL | 0 refills | Status: DC

## 2018-06-14 MED ORDER — ATORVASTATIN CALCIUM 20 MG PO TABS: 20.0000 mg | ORAL_TABLET | Freq: Every day | ORAL | 0 refills | Status: DC

## 2018-06-14 MED ORDER — METHOCARBAMOL 750 MG PO TABS: 750.0000 mg | ORAL_TABLET | Freq: Two times a day (BID) | ORAL | 0 refills | Status: DC | PRN

## 2018-06-14 NOTE — Progress Notes (Signed)
Progress Note  Patient Name: Jessica Parks Date of Encounter: 06/14/2018  Primary Cardiologist: Dr. Mertie Moores  Subjective   Patient awake and alert this morning.    Reports adequate left shoulder pain control.  No chest pain or shortness of breath.  Inpatient Medications    Scheduled Meds: . aspirin  81 mg Oral Daily  . carvedilol  3.125 mg Oral BID WC  . Chlorhexidine Gluconate Cloth  6 each Topical Daily  . enoxaparin (LOVENOX) injection  40 mg Subcutaneous Q24H   Continuous Infusions:  PRN Meds: ketorolac, traMADol   Vital Signs    Vitals:   06/14/18 0006 06/14/18 0445 06/14/18 0448 06/14/18 0726  BP: 108/74 115/77  128/86  Pulse: 92 97  96  Resp: 12 14  16   Temp: 98.7 F (37.1 C) 98.8 F (37.1 C)  98.2 F (36.8 C)  TempSrc: Oral Oral  Oral  SpO2: 91% 94%  97%  Weight:   75.4 kg   Height:        Intake/Output Summary (Last 24 hours) at 06/14/2018 0734 Last data filed at 06/13/2018 1600 Gross per 24 hour  Intake 572.68 ml  Output -  Net 572.68 ml   Filed Weights   06/11/18 1042 06/13/18 0600 06/14/18 0448  Weight: 73 kg 77.6 kg 75.4 kg    Telemetry    Sinus rhythm  Personally reviewed.  ECG    Tracing from 06/14/2018 shows sinus rhythm with leftward axis.  QT interval is now normal.  Personally reviewed.  Physical Exam   GEN: No acute distress.   Neck: No JVD. Cardiac: RRR, no gallop or rub.  Respiratory: Nonlabored. Clear to auscultation bilaterally. GI: Soft, nontender, bowel sounds present. MS:  Left arm in sling, no peripheral edema lower legs. Neuro:  Nonfocal. Psych: Alert and oriented x 3. Normal affect.  Labs    Chemistry Recent Labs  Lab 06/11/18 1445 06/11/18 1801 06/11/18 1818 06/12/18 0546 06/13/18 0458  NA 142 140  --  138 139  K 2.4* 3.1*  --  4.4 3.7  CL 105  --   --  107 109  CO2 22  --   --  22 23  GLUCOSE 336*  --   --  143* 116*  BUN 8  --   --  10 8  CREATININE 1.13*  --  1.13* 0.96 0.74  CALCIUM 7.6*  --    --  8.4* 8.1*  PROT 4.9*  --   --   --   --   ALBUMIN 2.5*  --   --   --   --   AST 23  --   --   --   --   ALT 13  --   --   --   --   ALKPHOS 40  --   --   --   --   BILITOT 0.3  --   --   --   --   GFRNONAA 56*  --  56* >60 >60  GFRAA >60  --  >60 >60 >60  ANIONGAP 15  --   --  9 7     Hematology Recent Labs  Lab 06/11/18 1818 06/11/18 2232 06/12/18 0546 06/13/18 0458  WBC 16.9*  --  14.0* 9.8  RBC 4.05  --  3.85* 3.24*  HGB 11.1* 10.9* 10.3* 8.6*  HCT 36.1 35.8* 33.6* 29.0*  MCV 89.1  --  87.3 89.5  MCH 27.4  --  26.8 26.5  MCHC 30.7  --  30.7 29.7*  RDW 11.9  --  12.0 12.1  PLT 231  --  186 143*    Cardiac Enzymes Recent Labs  Lab 06/11/18 1818 06/11/18 2232 06/12/18 0546 06/13/18 0458  TROPONINI 1.58* 2.93* 2.59* 0.79*   No results for input(s): TROPIPOC in the last 168 hours.    Radiology    No results found.  Cardiac Studies   Echocardiogram 06/12/2018:  1. The left ventricle has mild-moderately reduced systolic function, with an ejection fraction of 40-45%. The cavity size was normal. Left ventricular diastolic Doppler parameters are consistent with impaired relaxation.  2. The right ventricle has normal systolic function. The cavity was normal. There is no increase in right ventricular wall thickness.  3. The mitral valve is normal in structure.  4. The tricuspid valve is normal in structure.  5. The aortic valve is normal in structure.  6. The pulmonic valve was normal in structure.  SUMMARY   There is mild LV dysfunction with LVEF 45-50 % and hypokinesis in the basal and mid inferoseptal, anteroseptal and basal anterior walls.  Patient Profile     53 y.o. 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  Patient developed hypertensive emergency intraoperatively in the setting of inadvertent overdose of epinephrine used in regional block.   Cardiology consulted due to hemodynamic changes and ECG abnormalities.  Assessment & Plan    1.  Elevated troponin I with peak of 2.93 consistent with NSTEMI, most likely type II event (demand ischemia) perioperatively in the setting of epinephrine effects with hypertension, tachycardia, and spasm.  Follow-up ECG today shows no acute ST segment changes, normal  QT interval.  Troponin I level today is down to 0.79.  Echocardiogram does confirm evidence of cardiomyopathy however with LVEF approximately 45% and wall motion abnormalities in the mid to basal inferoseptal and anteroseptal walls.    2.  Blood loss anemia, current hemoglobin 8.6.  3. status post arthroscopic left shoulder surgery with lysis of adhesions and debridement by Dr. Erlinda Hong.  Plan: Patient is stable from a cardiac standpoint. BP is soft so will not add an ARB at this time. Continue current doses of ASA and Coreg. Would also add a statin for now. She is at increased CV risk with recent events and will need close follow up. Please arrange for follow up with Dr Acie Fredrickson in 2 weeks. Will need reassessment of LV function and some type of ischemic work up as outpatient.   Signed, Adit Riddles [place], MD  06/14/2018, 7:34 AM

## 2018-06-14 NOTE — Progress Notes (Addendum)
Subjective: 3 Days Post-Op Procedure(s) (LRB): LEFT SHOULDER ARTHROSCOPY WITH EXTENSIVE DEBRIDEMENT, LYSIS OF ADHESIONS, SUBACROMIAL DECOMPRESSION AND MANIPULATION UNDER ANESTHESIA (Left) Patient reports pain as mild.  Feeling ok this am.  Minimal pain to LUE.  Has been dangling arm to help mobilize.  No chest pain/sob.  Ready to go home.   Objective: Vital signs in last 24 hours: Temp:  [98.4 F (36.9 C)-99.1 F (37.3 C)] 98.8 F (37.1 C) (03/09 0445) Pulse Rate:  [86-98] 97 (03/09 0445) Resp:  [12-30] 14 (03/09 0445) BP: (96-120)/(68-82) 115/77 (03/09 0445) SpO2:  [84 %-96 %] 94 % (03/09 0445) Weight:  [75.4 kg] 75.4 kg (03/09 0448)  Intake/Output from previous day: 03/08 0701 - 03/09 0700 In: 572.7 [P.O.:350; I.V.:172.6; IV Piggyback:50.1] Out: -  Intake/Output this shift: No intake/output data recorded.  Recent Labs    06/11/18 1801 06/11/18 1818 06/11/18 2232 06/12/18 0546 06/13/18 0458  HGB 10.9* 11.1* 10.9* 10.3* 8.6*   Recent Labs    06/12/18 0546 06/13/18 0458  WBC 14.0* 9.8  RBC 3.85* 3.24*  HCT 33.6* 29.0*  PLT 186 143*   Recent Labs    06/12/18 0546 06/13/18 0458  NA 138 139  K 4.4 3.7  CL 107 109  CO2 22 23  BUN 10 8  CREATININE 0.96 0.74  GLUCOSE 143* 116*  CALCIUM 8.4* 8.1*   Recent Labs    06/11/18 1445  INR 1.2    Neurologically intact Neurovascular intact Sensation intact distally Intact pulses distally Incision: dressing C/D/I No cellulitis present Compartment soft  LUE- Surgical portals look good.  No evidence of cellulitis/infection.  No ischemic changes.  Mild to moderate swelling to LUE.  Compartments soft.  Fingers warm and well perfused   Assessment/Plan: 3 Days Post-Op Procedure(s) (LRB): LEFT SHOULDER ARTHROSCOPY WITH EXTENSIVE DEBRIDEMENT, LYSIS OF ADHESIONS, SUBACROMIAL DECOMPRESSION AND MANIPULATION UNDER ANESTHESIA (Left) Advance diet Up with therapy WBAT LUE troponins trending down Chest xray dramatically  improved CT/MRI brain normal Echo showed mild LV dysfunction- will ? Need outpatient diagnostic cardiac cath [facility] for asa 81 mg from ortho standpoint Patient has appt today to start PT for shoulder.  She will call and reschedule for tomorrow F/u with Dr. Erlinda Hong in one week for wound check F/u with Dr. Acie Fredrickson in two weeks      Jessica Parks 06/14/2018, 7:19 AM

## 2018-06-14 NOTE — Progress Notes (Signed)
Physical Therapy Treatment Patient Details Name: Jessica Parks MRN: [id number] DOB: 05-18-1965 Today's Date: 06/14/2018    History of Present Illness Pt is a 53 yo female admitted 06/11/18 for left shoulder arthroscopy for lysis of adhesions. Intraoperative course complicated by hypertensive emergency with EKG changes and pulmonary edema. Pt intubated 3/6; extubated later same evening. MRI brain negative for acute changes. PMH includes frozen shoulder, arthritis, anxiety.    PT Comments    Patient mobilizing very well. Patient performed stair negotiation and ambulation without difficulty. Anticipate patient will be safe for d/c home.   Follow Up Recommendations  Outpatient PT;Supervision - Intermittent     Equipment Recommendations  None recommended by PT    Recommendations for Other Services       Precautions / Restrictions Precautions Precautions: Fall Required Braces or Orthoses: Sling Restrictions Weight Bearing Restrictions: No Other Position/Activity Restrictions: Per Dr. Erlinda Hong, no LUE WB restrictions, ROM as tolerated    Mobility  Bed Mobility Overal bed mobility: Independent                Transfers Overall transfer level: Independent                  Ambulation/Gait Ambulation/Gait assistance: Independent Gait Distance (Feet): 580 Feet Assistive device: None Gait Pattern/deviations: WFL(Within Functional Limits) Gait velocity: decreased Gait velocity interpretation: >4.37 ft/sec, indicative of normal walking speed General Gait Details: steady with ambulation   Stairs Stairs: Yes Stairs assistance: Modified independent (Device/Increase time) Stair Management: Step to pattern;One rail Right Number of Stairs: 12 General stair comments: no difficulty   Wheelchair Mobility    Modified Rankin (Stroke Patients Only)       Balance Overall balance assessment: No apparent balance deficits (not formally assessed)                                           Cognition Arousal/Alertness: Awake/alert Behavior During Therapy: WFL for tasks assessed/performed Overall Cognitive Status: Within Functional Limits for tasks assessed                                        Exercises Positioning of UE while sleeping: Independent    General Comments        Pertinent Vitals/Pain Pain Assessment: 0-10 Pain Score: 2  Pain Location: LUE Pain Descriptors / Indicators: Discomfort Pain Intervention(s): Monitored during session    Home Living                      Prior Function            PT Goals (current goals can now be found in the care plan section) Acute Rehab PT Goals Patient Stated Goal: get back to playing tennis PT Goal Formulation: With patient Time For Goal Achievement: 06/26/18 Potential to Achieve Goals: Good Progress towards PT goals: Progressing toward goals    Frequency    Min 5X/week      PT Plan Current plan remains appropriate    Co-evaluation              AM-PAC PT "6 Clicks" Mobility   Outcome Measure  Help needed turning from your back to your side while in a flat bed without using bedrails?: None Help needed moving from lying on your  back to sitting on the side of a flat bed without using bedrails?: None Help needed moving to and from a bed to a chair (including a wheelchair)?: A Little Help needed standing up from a chair using your arms (e.g., wheelchair or bedside chair)?: A Little Help needed to walk in hospital room?: A Little Help needed climbing 3-5 steps with a railing? : A Little 6 Click Score: 20    End of Session   Activity Tolerance: Treatment limited secondary to medical complications (Comment) Patient left: in bed;with call bell/phone within reach;with family/visitor present;with nursing/sitter in room Nurse Communication: Mobility status PT Visit Diagnosis: Other abnormalities of gait and mobility (R26.89);Pain Pain - Right/Left:  Left Pain - part of body: Shoulder     Time: 8110-3159 PT Time Calculation (min) (ACUTE ONLY): 13 min  Charges:  $Gait Training: 8-22 mins                     Alben Deeds, PT DPT  Board Certified Neurologic Specialist [place] Pager [phone] Office [phone]    [name] 06/14/2018, 9:56 AM

## 2018-06-14 NOTE — Addendum Note (Signed)
Addendum  created 06/14/18 1121 by Cosette Prindle, Ernesta Amble, CRNA   Charge Capture section accepted, Visit diagnoses modified

## 2018-06-14 NOTE — Discharge Summary (Addendum)
[name] MRN: [id number] DOB/AGE: 1966-01-29 53 y.o.  Admit date: 06/11/2018 Discharge date: 06/14/2018  Admission Diagnoses:  Principal Problem:   Adhesive capsulitis of left shoulder Active Problems:   Respiratory failure ([place])   Encounter for intubation   Discharge Diagnoses:  Same  Past Medical History:  Diagnosis Date  . Anemia   . Anxiety disorder   . Arthritis   . Frozen shoulder    left- PT only 04/28/2018  . Hemorrhoids     Surgeries: Procedure(s): LEFT SHOULDER ARTHROSCOPY WITH EXTENSIVE DEBRIDEMENT, LYSIS OF ADHESIONS, SUBACROMIAL DECOMPRESSION AND MANIPULATION UNDER ANESTHESIA on 06/11/2018   Consultants: cardiology/pulmonology  Discharged Condition: Improved  Hospital Course: Jessica Parks is an 53 y.o. female who was admitted 06/11/2018 for operative treatment ofAdhesive capsulitis of left shoulder. Patient has severe unremitting pain that affects sleep, daily activities, and work/hobbies. After pre-op clearance the patient was taken to the operating room on 06/11/2018 and underwent  Procedure(s): LEFT SHOULDER ARTHROSCOPY WITH EXTENSIVE DEBRIDEMENT, LYSIS OF ADHESIONS, SUBACROMIAL DECOMPRESSION AND MANIPULATION UNDER ANESTHESIA.    During the intraoperative period, patient developed severe hypertension/tachycardia.  It was then discovered that she was given an over concentrated amount of epinephrine into shoulder just prior to incision.  The surgery was aborted and the patient became unstable.  She was transferred to [facility] hospital ICU.  Cardiology and pulmonology were consulted.  She had pulmonary edema and an acute NSTEMI.  Troponins continued to rise.  CT and MRI of the brain were normal.  Cardiac echo showed mild LV dysfunction.  Patient has dramatically improved at this point.  Her troponins have trended down. She will be started on asa 81mg  and coreg at d/c.  She will need to f/u with cardiology for possible outpatient diagnostic cardiac cath.  She  will f/u with [place] in one week.    Patient was given perioperative antibiotics:  Anti-infectives (From admission, onward)   Start     Dose/Rate Route Frequency Ordered Stop   06/11/18 1015  ceFAZolin (ANCEF) IVPB 2g/100 mL premix     2 g 200 mL/hr over 30 Minutes Intravenous On call to O.R. 06/11/18 1014 06/11/18 1310       Patient was given sequential compression devices, early ambulation, and chemoprophylaxis to prevent DVT.  Patient benefited maximally from hospital stay and there were no complications.    Recent vital signs:  Patient Vitals for the past 24 hrs:  BP Temp Temp src Pulse Resp SpO2 Weight  06/14/18 0841 110/78 - - 98 - - -  06/14/18 0726 128/86 98.2 F (36.8 C) Oral 96 16 97 % -  06/14/18 0448 - - - - - - 75.4 kg  06/14/18 0445 115/77 98.8 F (37.1 C) Oral 97 14 94 % -  06/14/18 0006 108/74 98.7 F (37.1 C) Oral 92 12 91 % -  06/13/18 2010 120/75 98.9 F (37.2 C) Oral 90 16 93 % -  06/13/18 1705 118/82 - - - (!) 22 - -  06/13/18 1700 - - - - - 96 % -  06/13/18 1630 115/76 - - 94 19 96 % -  06/13/18 1610 - 99.1 F (37.3 C) Oral - - - -  06/13/18 1600 110/72 - - 96 (!) 24 95 % -  06/13/18 1500 112/77 - - 89 (!) 27 94 % -  06/13/18 1400 102/71 - - 91 (!) 22 91 % -  06/13/18 1306 100/68 - - - 16 - -  06/13/18 1215 103/72 - -  90 (!) 30 95 % -  06/13/18 1144 - 98.4 F (36.9 C) Oral - - - -  06/13/18 1100 103/76 - - 90 20 (!) 84 % -  06/13/18 1000 96/70 - - 86 (!) 22 96 % -     Recent laboratory studies:  Recent Labs    06/11/18 1445  06/12/18 0546 06/13/18 0458 06/14/18 0716  WBC 8.1   < > 14.0* 9.8  --   HGB 9.7*   < > 10.3* 8.6*  --   HCT 32.0*   < > 33.6* 29.0*  --   PLT 194   < > 186 143*  --   NA 142   < > 138 139 137  K 2.4*   < > 4.4 3.7 3.7  CL 105  --  107 109 104  CO2 22  --  22 23 24   BUN 8  --  10 8 7   CREATININE 1.13*   < > 0.96 0.74 0.77  GLUCOSE 336*  --  143* 116* 132*  INR 1.2  --   --   --   --   CALCIUM 7.6*  --  8.4* 8.1*  8.8*   < > = values in this interval not displayed.     Discharge Medications:   Allergies as of 06/14/2018   No Known Allergies     Medication List    TAKE these medications   aspirin 81 MG chewable tablet Chew 1 tablet (81 mg total) by mouth daily.   atorvastatin 20 MG tablet Commonly known as:  LIPITOR Take 1 tablet (20 mg total) by mouth daily at 6 PM.   carvedilol 3.125 MG tablet Commonly known as:  COREG Take 1 tablet (3.125 mg total) by mouth 2 (two) times daily with a meal.   HAIR/SKIN/NAILS PO Take 1 tablet by mouth daily.   ibuprofen 800 MG tablet Commonly known as:  ADVIL,MOTRIN Take 1 tablet (800 mg total) by mouth every 8 (eight) hours as needed.   methocarbamol 750 MG tablet Commonly known as:  ROBAXIN Take 1 tablet (750 mg total) by mouth 2 (two) times daily as needed for muscle spasms.   multivitamin tablet Take 1 tablet by mouth daily.   omega-3 acid ethyl esters 1 g capsule Commonly known as:  LOVAZA Take 1 g by mouth daily.   ondansetron 4 MG tablet Commonly known as:  ZOFRAN Take 1-2 tablets (4-8 mg total) by mouth every 8 (eight) hours as needed for nausea or vomiting.   oxyCODONE-acetaminophen 5-325 MG tablet Commonly known as:  Percocet Take 0.5-1 tablets by mouth every 6 (six) hours as needed for severe pain.   promethazine 25 MG tablet Commonly known as:  PHENERGAN Take 1 tablet (25 mg total) by mouth every 6 (six) hours as needed for nausea.       Diagnostic Studies: Ct Head Wo Contrast  Result Date: 06/11/2018 CLINICAL DATA:  Ct head WO, Hypertensive emergency, r/o CVA / ICH EXAM: CT HEAD WITHOUT CONTRAST TECHNIQUE: Contiguous axial images were obtained from the base of the skull through the vertex without intravenous contrast. COMPARISON:  None. FINDINGS: Brain: No evidence of acute infarction, hemorrhage, hydrocephalus, extra-axial collection or mass lesion/mass effect. Vascular: No hyperdense vessel or unexpected calcification.  Skull: Normal. Negative for fracture or focal lesion. Sinuses/Orbits: No acute finding. Other: A small amount of fluid layers within the posterior nasopharynx. IMPRESSION: Negative exam. Electronically Signed   By: Nolon Nations M.D.   On: 06/11/2018 16:52  Mr Brain Wo Contrast  Result Date: 06/12/2018 CLINICAL DATA:  Altered level of consciousness, improved EXAM: MRI HEAD WITHOUT CONTRAST TECHNIQUE: Multiplanar, multiecho pulse sequences of the brain and surrounding structures were obtained without intravenous contrast. COMPARISON:  Head CT from yesterday FINDINGS: Brain: No infarction, hemorrhage, hydrocephalus, extra-axial collection or mass lesion. No white matter disease or atrophy Vascular: Major flow voids are preserved Skull and upper cervical spine: Negative for marrow lesion Sinuses/Orbits: Negative IMPRESSION: Normal brain MRI. Electronically Signed   By: Monte Fantasia M.D.   On: 06/12/2018 07:29   Mr Shoulder Left W/o Contrast  Result Date: 05/25/2018 CLINICAL DATA:  Chronic shoulder pain and limited range of motion since July 2019. EXAM: MRI OF THE LEFT SHOULDER WITHOUT CONTRAST TECHNIQUE: Multiplanar, multisequence MR imaging of the shoulder was performed. No intravenous contrast was administered. COMPARISON:  Left shoulder x-rays dated October 23, 2017. FINDINGS: Rotator cuff:  Intact rotator cuff.  Mild supraspinatus tendinosis. Muscles: Faint, patchy muscle edema involving the supraspinatus, infraspinatus, teres minor, and deltoid muscles. No muscle atrophy. Biceps long head:  Intact and normally positioned. Acromioclavicular Joint: Mild arthropathy of the acromioclavicular joint. Type I acromion. Trace subacromial/subdeltoid bursal fluid. Glenohumeral Joint: No joint effusion. No chondral defect. Labrum: Grossly intact, but evaluation is limited by lack of intraarticular fluid. Bones: No acute fracture or dislocation. 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 Other: Thickening of the anterior inferior glenohumeral ligament. Preserved fat in the rotator interval. IMPRESSION: 1. 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 No muscle atrophy. 2. Thickening of the anterior inferior glenohumeral ligament, which can be seen with adhesive capsulitis. 3. Mild supraspinatus tendinosis. Electronically Signed   By: Titus Dubin M.D.   On: 05/25/2018 10:22   Dg Chest Port 1 View  Result Date: 06/12/2018 CLINICAL DATA:  Acute respiratory insufficiency EXAM: PORTABLE CHEST 1 VIEW COMPARISON:  06/11/2018 FINDINGS: Endotracheal tube is been removed in the interval. Right jugular line is again seen. Cardiac shadow is stable. Near complete resolution of previously seen multifocal infiltrates is noted. Only minimal residual is seen in the right base. No bony abnormality is noted. IMPRESSION: Significant improved aeration when compared with the previous day. Electronically Signed   By: Inez Catalina M.D.   On: 06/12/2018 08:38   Dg Chest Port 1 View  Result Date: 06/11/2018 CLINICAL DATA:  Intubation. EXAM: PORTABLE CHEST 1 VIEW COMPARISON:  08/06/2015 FINDINGS: Endotracheal tube tip projects 4.8 cm above the carina. Right internal jugular central venous line tip projects either in the central internal jugular vein or at the level of the internal jugular vein, superior vena cava confluence. No pneumothorax. There is extensive patchy consolidation throughout much of the right lung most prominently in the central and lower right lung. There is additional opacity at the left medial lung base which may reflect additional infection or atelectasis. Cardiac silhouette is normal in size. IMPRESSION: 1. Endotracheal tube tip 4.8 cm above the carina. 2. Right internal jugular central  venous line tip at or just above the internal jugular superior vena cava confluence. 3. No pneumothorax. 4. Multifocal right lung pneumonia. Additional pneumonia versus atelectasis at the posteromedial left lung base. Electronically Signed   By: Lajean Manes M.D.   On: 06/11/2018 16:38    Disposition: Discharge disposition: 01-Home or Self Care  Discharge Instructions    Call MD / Call 911   Complete by:  As directed    If you experience chest pain or shortness of breath, CALL 911 and be transported to the hospital emergency room.  If you develope a fever above 101.5 F, pus (white drainage) or increased drainage or redness at the wound, or calf pain, call your surgeon's office.   Constipation Prevention   Complete by:  As directed    Drink plenty of fluids.  Prune juice may be helpful.  You may use a stool softener, such as Colace (over the counter) 100 mg twice a day.  Use MiraLax (over the counter) for constipation as needed.   Driving restrictions   Complete by:  As directed    No driving while taking narcotic pain meds.   Increase activity slowly as tolerated   Complete by:  As directed       Follow-up Information    Leandrew Koyanagi, MD In 1 week.   Specialty:  Orthopedic Surgery Why:  For wound re-check, For suture removal Contact information: [place] [address] [phone]        Nahser, Wonda Cheng, MD. Schedule an appointment as soon as possible for a visit in 2 week(s).   Specialty:  Cardiology Contact information: [address] [place] [address] [phone]            Signed: Aundra Dubin 06/14/2018, 9:29 AM

## 2018-06-14 NOTE — Anesthesia Postprocedure Evaluation (Signed)
Anesthesia Post Note  Patient: ONEDIA VARGUS  Procedure(s) Performed: LEFT SHOULDER ARTHROSCOPY WITH EXTENSIVE DEBRIDEMENT, LYSIS OF ADHESIONS, SUBACROMIAL DECOMPRESSION AND MANIPULATION UNDER ANESTHESIA (Left Shoulder)     Patient location during evaluation: Other Anesthesia Type: General Level of consciousness: patient remains intubated per anesthesia plan Vital Signs Assessment: vitals unstable Respiratory status: patient remains intubated per anesthesia plan Cardiovascular status: tachycardic and unstable Anesthetic complications: no Comments: Transfer to [facility] ICU via EMS    Last Vitals:  Vitals:   06/14/18 0445 06/14/18 0726  BP: 115/77 128/86  Pulse: 97 96  Resp: 14 16  Temp: 37.1 C 36.8 C  SpO2: 94% 97%    Last Pain:  Vitals:   06/14/18 0726  TempSrc: Oral  PainSc:                  Montez Hageman

## 2018-06-14 NOTE — Transfer of Care (Signed)
Immediate Anesthesia Transfer of Care Note  Patient: MAHIRA PETRAS  Procedure(s) Performed: LEFT SHOULDER ARTHROSCOPY WITH EXTENSIVE DEBRIDEMENT, LYSIS OF ADHESIONS, SUBACROMIAL DECOMPRESSION AND MANIPULATION UNDER ANESTHESIA (Left Shoulder)  Patient Location: PACU and OR  Anesthesia Type:GA combined with regional for post-op pain  Level of Consciousness: Patient remains intubated per anesthesia plan  Airway & Oxygen Therapy: Patient remains intubated per anesthesia plan  Post-op Assessment: report given to EMS for transfer to ICU  Post vital signs: Reviewed  Last Vitals:  Vitals Value Taken Time  BP    Temp    Pulse    Resp    SpO2      Last Pain:  Vitals:   06/14/18 0726  TempSrc: Oral  PainSc:       Patients Stated Pain Goal: 0 (09/81/19 1478)  Complications: possible epinephrine overdose

## 2018-06-16 NOTE — Addendum Note (Signed)
Addendum  created 06/16/18 0801 by Montez Hageman, MD   Clinical Note Signed, Intraprocedure Blocks edited

## 2018-06-22 ENCOUNTER — Other Ambulatory Visit: Payer: Self-pay

## 2018-06-22 ENCOUNTER — Encounter (INDEPENDENT_AMBULATORY_CARE_PROVIDER_SITE_OTHER): Payer: Self-pay | Admitting: Orthopaedic Surgery

## 2018-06-22 ENCOUNTER — Ambulatory Visit (INDEPENDENT_AMBULATORY_CARE_PROVIDER_SITE_OTHER): Payer: BLUE CROSS/BLUE SHIELD | Admitting: Orthopaedic Surgery

## 2018-06-22 VITALS — Ht 66.0 in | Wt 166.2 lb

## 2018-06-22 DIAGNOSIS — M7502 Adhesive capsulitis of left shoulder: Secondary | ICD-10-CM

## 2018-06-22 NOTE — Progress Notes (Signed)
   Post-Op Visit Note   Patient: Jessica Parks           Date of Birth: [birth year]           MRN: [id number] Visit Date: 06/22/2018 PCP: Jessica Chard, MD   Assessment & Plan:  Chief Complaint:  Chief Complaint  Patient presents with  . Left Shoulder - Routine Post Op    06/11/2018 left shoulder arthroscopy & Deb   Visit Diagnoses:  1. Adhesive capsulitis of left shoulder     Plan: Jessica Parks is 11 days status post left shoulder manipulation and arthroscopy with lysis of adhesions.  She is doing well overall.  She reports no significant pain in her left shoulder.  Her portal sites are healing well without any drainage or signs of infection.  Her range of motion has regressed since the initial procedure.  She recently started physical therapy for aggressive range of motion.  On examination she has flexion to 95 degrees, external rotation to 10 degrees, internal rotation to side of the hip.  At this point she will continue to work aggressively to improve her range of motion with outpatient PT.  We will recheck her in 6 weeks.  Questions encouraged and answered.  Follow-Up Instructions: Return in about 6 weeks (around 08/03/2018).   Orders:  No orders of the defined types were placed in this encounter.  No orders of the defined types were placed in this encounter.   Imaging: No results found.  PMFS History: Patient Active Problem List   Diagnosis Date Noted  . Respiratory failure ([place]) 06/11/2018  . Encounter for intubation   . Adhesive capsulitis of left shoulder 02/17/2018  . Left shoulder pain 10/23/2017  . Arthritis of right acromioclavicular joint 09/23/2016  . History of colonic polyps 05/06/2011  . Internal hemorrhoids 05/23/2009  . CONSTIPATION 05/23/2009   Past Medical History:  Diagnosis Date  . Anemia   . Anxiety disorder   . Arthritis   . Frozen shoulder    left- PT only 04/28/2018  . Hemorrhoids     Family History  Problem Relation Age of Onset  . Breast  cancer Maternal Grandmother   . Breast cancer Maternal Aunt   . Breast cancer Maternal Aunt   . Diabetes Maternal Aunt   . Colon cancer Neg Hx   . Rectal cancer Neg Hx     Past Surgical History:  Procedure Laterality Date  . BREAST BIOPSY Left 2014   benign  . SHOULDER ARTHROSCOPY WITH SUBACROMIAL DECOMPRESSION Left 06/11/2018   Procedure: LEFT SHOULDER ARTHROSCOPY WITH EXTENSIVE DEBRIDEMENT, LYSIS OF ADHESIONS, SUBACROMIAL DECOMPRESSION AND MANIPULATION UNDER ANESTHESIA;  Surgeon: Leandrew Koyanagi, MD;  Location: [place];  Service: Orthopedics;  Laterality: Left;  . TUBAL LIGATION     Social History   Occupational History  . Occupation: unemployed    Fish farm manager: [employer]  Tobacco Use  . Smoking status: Never Smoker  . Smokeless tobacco: Never Used  Substance and Sexual Activity  . Alcohol use: Yes    Comment: socially  . Drug use: No  . Sexual activity: Yes    Partners: Male    Birth control/protection: Surgical

## 2018-06-23 ENCOUNTER — Encounter: Payer: Self-pay | Admitting: Cardiology

## 2018-06-28 LAB — ECHOCARDIOGRAM LIMITED
Height: 66 in
Weight: 2574.97 oz

## 2018-06-30 ENCOUNTER — Telehealth: Payer: Self-pay | Admitting: Nurse Practitioner

## 2018-06-30 ENCOUNTER — Telehealth: Payer: Self-pay

## 2018-06-30 NOTE — Telephone Encounter (Signed)
lpmtcb 3/25 

## 2018-06-30 NOTE — Telephone Encounter (Signed)
Left message for patient to call back to discuss office visit scheduled for this week

## 2018-06-30 NOTE — Telephone Encounter (Signed)
-----   Message from Consuelo Pandy, [place] sent at 06/29/2018  1:53 PM EDT ----- Regarding: Tele visit Chart reviewed. F/u is for post hospital. See if pt is willing to do a tele virtual visit this week to help limit exposure.

## 2018-06-30 NOTE — Telephone Encounter (Signed)
Patient returned your call.

## 2018-06-30 NOTE — Telephone Encounter (Signed)
TELEPHONE CALL NOTE  Jessica Parks has been deemed a candidate for a follow-up tele-health visit to limit community exposure during the Covid-19 pandemic. I spoke with the patient via phone to ensure availability of phone/video source, confirm preferred email & phone number, discuss instructions and expectations, and review consent.   I reminded Jessica Parks to be prepared with any vital sign and/or heart rhythm information that could potentially be obtained via home monitoring, at the time of her visit.  Finally, I reminded Jessica Parks to expect an e-mail containing a link for their video-based visit approximately 15 minutes before her visit, or alternatively, a phone call at the time of her visit if her visit is planned to be a phone encounter.  Did the patient verbally consent to treatment as below? Yes  Patient is scheduled for virtual visit on Friday 3/27 at 3 pm  Emmaline Life, RN 06/30/2018 3:09 PM  DOWNLOADING THE SOFTWARE (If applicable)  Download the [employer] app to enable video and telephone visits with your [facility] Provider.   Instructions for downloading [employer] Web[employer]: - Go to https://www.webex.com/downloads.html and follow the instructions - If you have technical difficulties with downloading WebEx, please call Web[employer] at [phone]. - Once the app is downloaded (can be done on either mobile or desktop computer), go to Settings in the upper left hand corner.  Be sure that camera and audio are enabled.  - You will receive an email message with a link to the meeting with a time to join for your tele-health visit.  - Please download the app and have settings configured prior to the appointment time.    CONSENT FOR TELE-HEALTH VISIT - PLEASE REVIEW  I hereby voluntarily request, consent and authorize [facility] and its employed or contracted physicians, physician assistants, nurse practitioners or other licensed health care professionals (the  Practitioner), to provide me with telemedicine health care services (the "Services") as deemed necessary by the treating Practitioner. I acknowledge and consent to receive the Services by the Practitioner via telemedicine. I understand that the telemedicine visit will involve communicating with the Practitioner through live audiovisual communication technology and the disclosure of certain medical information by electronic transmission. I acknowledge that I have been given the opportunity to request an in-person assessment or other available alternative prior to the telemedicine visit and am voluntarily participating in the telemedicine visit.  I understand that I have the right to withhold or withdraw my consent to the use of telemedicine in the course of my care at any time, without affecting my right to future care or treatment, and that the Practitioner or I may terminate the telemedicine visit at any time. I understand that I have the right to inspect all information obtained and/or recorded in the course of the telemedicine visit and may receive copies of available information for a reasonable fee.  I understand that some of the potential risks of receiving the Services via telemedicine include:  Marland Kitchen Delay or interruption in medical evaluation due to technological equipment failure or disruption; . Information transmitted may not be sufficient (e.g. poor resolution of images) to allow for appropriate medical decision making by the Practitioner; and/or  . In rare instances, security protocols could fail, causing a breach of personal health information.  Furthermore, I acknowledge that it is my responsibility to provide information about my medical history, conditions and care that is complete and accurate to the best of my ability. I acknowledge that Practitioner's advice, recommendations,  and/or decision may be based on factors not within their control, such as incomplete or inaccurate data provided by me  or distortions of diagnostic images or specimens that may result from electronic transmissions. I understand that the practice of medicine is not an exact science and that Practitioner makes no warranties or guarantees regarding treatment outcomes. I acknowledge that I will receive a copy of this consent concurrently upon execution via email to the email address I last provided but may also request a printed copy by calling the office of [place].    I understand that my insurance will be billed for this visit.   I have read or had this consent read to me. . I understand the contents of this consent, which adequately explains the benefits and risks of the Services being provided via telemedicine.  . I have been provided ample opportunity to ask questions regarding this consent and the Services and have had my questions answered to my satisfaction. . I give my informed consent for the services to be provided through the use of telemedicine in my medical care  By participating in this telemedicine visit I agree to the above.

## 2018-06-30 NOTE — Telephone Encounter (Signed)
-----   Message from Thayer Headings, MD sent at 06/29/2018  9:12 AM EDT ----- Good morning.  Please arrange for me to see [name] via tele medicine later this week. She is currently scheduled to see Brittainy but I think it will be easier for me to see as a telemedicine visit since I know her   Thank  Abbe [place]

## 2018-07-01 ENCOUNTER — Telehealth (INDEPENDENT_AMBULATORY_CARE_PROVIDER_SITE_OTHER): Payer: Self-pay | Admitting: Orthopaedic Surgery

## 2018-07-01 NOTE — Telephone Encounter (Signed)
See message.

## 2018-07-01 NOTE — Telephone Encounter (Signed)
Please have her come in for left shoulder GH joint with [place] asap.

## 2018-07-01 NOTE — Telephone Encounter (Signed)
When can I make appt for patient. See message below.

## 2018-07-01 NOTE — Telephone Encounter (Signed)
Patient called advised she has gone to (PT)  For 3 visits and her left arm is still stiff. Patient asked for a call back concerning this. The number to contact patient is [phone]

## 2018-07-02 ENCOUNTER — Ambulatory Visit (INDEPENDENT_AMBULATORY_CARE_PROVIDER_SITE_OTHER): Payer: BLUE CROSS/BLUE SHIELD | Admitting: Cardiovascular Disease

## 2018-07-02 ENCOUNTER — Encounter: Payer: Self-pay | Admitting: Cardiovascular Disease

## 2018-07-02 ENCOUNTER — Ambulatory Visit: Payer: BLUE CROSS/BLUE SHIELD | Admitting: Cardiology

## 2018-07-02 DIAGNOSIS — I214 Non-ST elevation (NSTEMI) myocardial infarction: Secondary | ICD-10-CM | POA: Diagnosis not present

## 2018-07-02 MED ORDER — LISINOPRIL 10 MG PO TABS: 10.0000 mg | ORAL_TABLET | Freq: Every day | ORAL | 11 refills | Status: DC

## 2018-07-02 NOTE — Telephone Encounter (Signed)
Left message on patient's mobile voice mail to call me back to schedule her an appointment for US-guided left shoulder injection.

## 2018-07-02 NOTE — Telephone Encounter (Signed)
That would be fine 

## 2018-07-02 NOTE — Progress Notes (Signed)
Virtual Visit via Video Note    Evaluation Performed:  Follow-up visit  This visit type was conducted due to national recommendations for restrictions regarding the COVID-19 Pandemic (e.g. social distancing).  This format is felt to be most appropriate for this patient at this time.  All issues noted in this document were discussed and addressed.  No physical exam was performed (except for noted visual exam findings with Video Visits).  Please refer to the patient's chart (MyChart message for video visits and phone note for telephone visits) for the patient's consent to telehealth for [facility]  Date:  07/05/2018   ID:  Jessica Parks, DOB Apr 29, 1965, MRN [id number]  Patient Location:  Home   Provider location:   [place], [place], [place]   PCP:  Glendale Chard, MD  Cardiologist:  Mertie Moores, MD  Electrophysiologist:  None   Chief Complaint:  followup CHF and NSTEMI / Takotsubo Syndrome   History of Present Illness:    Jessica Parks is a 53 y.o. female who presents via audio/video conferencing for a telehealth visit today.    I met Jessica Parks in the hospital  March 6  when she presented acutely after having shoulder surgery.     The patient does not symptoms concerning for COVID-19 infection (fever, chills, cough, or new shortness of breath).   She had received an inadvertent intra capsular epinephrine overdose during surgery. The patient had hypotension and tachycardia during the surgery which was controlled with esmolol.  She was brought to the CCU following her surgery.  Stat echocardiogram revealed a moderately  depressed left ventricular systolic function with akinesis of the basilar aspect of the heart.  The apex seem to contract fairly well.  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   Is waking on the treadmill 30 min a day Breathing has improved.  No leg edema  No  CP  Still eating some salty foods No PND , no orthopnea   no fever or cough, no covid symptoms  Had questions about her EF  About further work up         Prior CV studies:   The following studies were reviewed today:   Past Medical History:  Diagnosis Date  . Anemia   . Anxiety disorder   . Arthritis   . Frozen shoulder    left- PT only 04/28/2018  . Hemorrhoids    Past Surgical History:  Procedure Laterality Date  . BREAST BIOPSY Left 2014   benign  . SHOULDER ARTHROSCOPY WITH SUBACROMIAL DECOMPRESSION Left 06/11/2018   Procedure: LEFT SHOULDER ARTHROSCOPY WITH EXTENSIVE DEBRIDEMENT, LYSIS OF ADHESIONS, SUBACROMIAL DECOMPRESSION AND MANIPULATION UNDER ANESTHESIA;  Surgeon: Leandrew Koyanagi, MD;  Location: [facility];  Service: Orthopedics;  Laterality: Left;  . TUBAL LIGATION       Current Meds  Medication Sig  . aspirin 81 MG chewable tablet Chew 1 tablet (81 mg total) by mouth daily.  Marland Kitchen atorvastatin (LIPITOR) 20 MG tablet Take 1 tablet (20 mg total) by mouth daily at 6 PM.  . Biotin w/ Vitamins C & E (HAIR/SKIN/NAILS PO) Take 1 tablet by mouth daily.   . carvedilol (COREG) 3.125 MG tablet Take 1 tablet (3.125 mg total) by mouth 2 (two) times daily with a meal.  . ibuprofen (ADVIL,MOTRIN) 800 MG tablet Take 1 tablet (800 mg total) by mouth every 8 (eight)  hours as needed.  . methocarbamol (ROBAXIN) 750 MG tablet Take 1 tablet (750 mg total) by mouth 2 (two) times daily as needed for muscle spasms.  . Multiple Vitamin (MULTIVITAMIN) tablet Take 1 tablet by mouth daily.  Marland Kitchen oxyCODONE-acetaminophen (PERCOCET) 5-325 MG tablet Take 0.5-1 tablets by mouth every 6 (six) hours as needed for severe pain.     Allergies:   Patient has no known allergies.   Social History   Tobacco Use  . Smoking status: Never Smoker  . Smokeless tobacco: Never Used  Substance Use Topics  . Alcohol use: Yes    Comment: socially  . Drug use: No     Family Hx: The patient's  family history includes Breast cancer in her maternal aunt, maternal aunt, and maternal grandmother; Diabetes in her maternal aunt. There is no history of Colon cancer or Rectal cancer.  ROS:   Please see the history of present illness.     All other systems reviewed and are negative.   Labs/Other Tests and Data Reviewed:    Recent Labs: 06/11/2018: ALT 13 06/13/2018: Hemoglobin 8.6; Platelets 143 06/14/2018: BUN 7; Creatinine, Ser 0.77; Magnesium 1.9; Potassium 3.7; Sodium 137   Recent Lipid Panel No results found for: CHOL, TRIG, HDL, CHOLHDL, LDLCALC, LDLDIRECT  Wt Readings from Last 3 Encounters:  07/02/18 160 lb (72.6 kg)  06/22/18 166 lb 3.7 oz (75.4 kg)  06/14/18 166 lb 3.6 oz (75.4 kg)     Exam:    Vital Signs:  BP 127/89 (BP Location: Left Arm, Patient Position: Sitting, Cuff Size: Normal)   Pulse 72   Ht _0  (1.676 m)   Wt 160 lb (72.6 kg)   BMI 25.82 kg/m    Well nourished, well developed female in no acute distress. She appears quite comfortable on the video chat.  There is no labored breathing.  No obvious JVD.  Neurologically she is intact.  There is no leg swelling.  She does have reduced mobility of her left shoulder.  She is alert and oriented x3.  ASSESSMENT & PLAN:    1.  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  Her ejection fraction was 40 to 45% upon discharge from the hospital.  We have not reassessed her ejection fraction yet and we are limiting her echocardiograms given the recent coronavirus outbreak.  Her blood pressure was relatively low upon at discharge so she was discharged on carvedilol and was not started on ACE inhibitor or ARB.  We will start her on lisinopril 10 mg a day.  We have chosen lisinopril because there is been lots of difficulty in getting the ARB's.  In addition, her ejection fraction is 40 to 45% so it  is not low enough to consider starting Entresto.  We will see her again in 3 months.  We will get an echocardiogram the week before.  She will call us if she develops any problems.  We discussed the side effect of cough. She will try to minimize her sodium intake.  COVID-19 Education: The signs and symptoms of COVID-19 were discussed with the patient and how to seek care for testing (follow up with PCP or arrange E-visit).  The importance of social distancing was discussed today.  Patient Risk:   After full review of this patients clinical status, I feel that they are at least moderate risk at this time.  Time:  Today, I have spent 35  minutes with the patient with telehealth technology discussing hospital course.   Meds, prognosis .     Medication Adjustments/Labs and Tests Ordered: Current medicines are reviewed at length with the patient today.  Concerns regarding medicines are outlined above.  Tests Ordered: No orders of the defined types were placed in this encounter.  Medication Changes: Meds ordered this encounter  Medications  . lisinopril (PRINIVIL,ZESTRIL) 10 MG tablet    Sig: Take 1 tablet (10 mg total) by mouth daily.    Dispense:  30 tablet    Refill:  11    Disposition:  Follow up in 3 month(s)  Signed, Mertie Moores, MD  07/05/2018 8:45 AM    [place]

## 2018-07-02 NOTE — Telephone Encounter (Signed)
Patient was not able to come today. Scheduled for 12:40 on Monday 07/05/2018.

## 2018-07-02 NOTE — Telephone Encounter (Signed)
Ok to bring her in today, if the patient so wishes?

## 2018-07-02 NOTE — Patient Instructions (Signed)
Medication Instructions:  Your physician has recommended you make the following change in your medication:  START Lisinopril 10 mg once daily   If you need a refill on your cardiac medications before your next appointment, please call your pharmacy.    Lab work: None Ordered    Testing/Procedures: None Ordered   Follow-Up: Your physician recommends that you schedule a follow-up appointment in: 2-3 months with Dr. Acie Fredrickson

## 2018-07-04 ENCOUNTER — Other Ambulatory Visit (INDEPENDENT_AMBULATORY_CARE_PROVIDER_SITE_OTHER): Payer: Self-pay | Admitting: Physician Assistant

## 2018-07-05 ENCOUNTER — Encounter: Payer: Self-pay | Admitting: Cardiovascular Disease

## 2018-07-05 ENCOUNTER — Ambulatory Visit (INDEPENDENT_AMBULATORY_CARE_PROVIDER_SITE_OTHER): Payer: BLUE CROSS/BLUE SHIELD | Admitting: Family Medicine

## 2018-07-05 ENCOUNTER — Encounter (INDEPENDENT_AMBULATORY_CARE_PROVIDER_SITE_OTHER): Payer: Self-pay | Admitting: Family Medicine

## 2018-07-05 ENCOUNTER — Other Ambulatory Visit: Payer: Self-pay

## 2018-07-05 VITALS — Wt 158.4 lb

## 2018-07-05 DIAGNOSIS — M7502 Adhesive capsulitis of left shoulder: Secondary | ICD-10-CM

## 2018-07-05 DIAGNOSIS — I214 Non-ST elevation (NSTEMI) myocardial infarction: Secondary | ICD-10-CM | POA: Insufficient documentation

## 2018-07-05 NOTE — Progress Notes (Signed)
Office Visit Note   Patient: Jessica Parks           Date of Birth: Aug 09, 1965           MRN: [id number] Visit Date: 07/05/2018 Requested by: Glendale Chard, [address] [place], [place] [address] PCP: Glendale Chard, MD  Subjective: Chief Complaint  Patient presents with   Left Shoulder - Pain    US - guided cortisone injection per Dr. Erlinda Hong S/p arthroscopy, debridement, lysis of adhesions, SAD & MUA 06/11/2018    HPI: She is here for possible left glenohumeral injection.  She underwent manipulation on March 3.  She had a complication related to epinephrine overdose and had hypotension and tachycardia with depressed left ventricular function.  She is doing well from a cardiac standpoint but still has unchanged stiffness in her left shoulder.  Her pain is manageable but she is frustrated that she has not improved in range of motion despite her surgery.  It has been about 9 months since the onset of her symptoms.  With the coronavirus issue, she is very hesitant to undergo another injection.              ROS: No fevers or chills.  All other systems were reviewed and are negative.  Objective: Vital Signs: Wt 158 lb 6.4 oz (71.8 kg)    BMI 25.57 kg/m   Physical Exam:  General:  Alert and oriented, in no acute distress. Pulm:  Breathing unlabored. Psy:  Normal mood, congruent affect. Skin: No rash on her skin. Left shoulder: Still has very limited range of motion with abduction of 45 degrees, external rotation of about 30 degrees and internal rotation of about 30 degrees.  Pain at the extremes.  Imaging: None today.  Assessment & Plan: 1.  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  Instead she will start working on rotator cuff strengthening using Thera-Band and she will continue doing her daily stretching  exercises.  If pain gets worse, could inject with Toradol.  PRP would be a future consideration.     Procedures: No procedures performed  No notes on file     PMFS History: Patient Active Problem List   Diagnosis Date Noted   NSTEMI (non-ST elevated myocardial infarction) ([facility]) 07/05/2018   Respiratory failure ([facility]) 06/11/2018   Encounter for intubation    Adhesive capsulitis of left shoulder 02/17/2018   Left shoulder pain 10/23/2017   Arthritis of right acromioclavicular joint 09/23/2016   History of colonic polyps 05/06/2011   Internal hemorrhoids 05/23/2009   CONSTIPATION 05/23/2009   Past Medical History:  Diagnosis Date   Anemia    Anxiety disorder    Arthritis    Frozen shoulder    left- PT only 04/28/2018   Hemorrhoids     Family History  Problem Relation Age of Onset   Breast cancer Maternal Grandmother    Breast cancer Maternal Aunt    Breast cancer Maternal Aunt    Diabetes Maternal Aunt    Colon cancer Neg Hx    Rectal cancer Neg Hx     Past Surgical History:  Procedure Laterality Date   BREAST BIOPSY Left 2014   benign   SHOULDER ARTHROSCOPY WITH SUBACROMIAL DECOMPRESSION Left 06/11/2018   Procedure: LEFT SHOULDER ARTHROSCOPY WITH EXTENSIVE DEBRIDEMENT, LYSIS OF ADHESIONS, SUBACROMIAL DECOMPRESSION AND MANIPULATION UNDER ANESTHESIA;  Surgeon:  Leandrew Koyanagi, MD;  Location: [facility];  Service: Orthopedics;  Laterality: Left;   TUBAL LIGATION     Social History   Occupational History   Occupation: unemployed    Fish farm manager: [employer]  Tobacco Use   Smoking status: Never Smoker   Smokeless tobacco: Never Used  Substance and Sexual Activity   Alcohol use: Yes    Comment: socially   Drug use: No   Sexual activity: Yes    Partners: Male    Birth control/protection: Surgical

## 2018-07-06 NOTE — Telephone Encounter (Signed)
See message below. Thank you

## 2018-07-06 NOTE — Telephone Encounter (Signed)
Can you make sure this is refilled by cardiologist?

## 2018-07-07 NOTE — Telephone Encounter (Signed)
Thank you :)

## 2018-07-12 ENCOUNTER — Telehealth: Payer: BLUE CROSS/BLUE SHIELD | Admitting: Cardiology

## 2018-07-12 ENCOUNTER — Telehealth: Payer: Self-pay | Admitting: Cardiovascular Disease

## 2018-07-12 MED ORDER — LISINOPRIL 5 MG PO TABS: 5.0000 mg | ORAL_TABLET | Freq: Every day | ORAL | 0 refills | Status: DC

## 2018-07-12 MED ORDER — CARVEDILOL 3.125 MG PO TABS: 3.1250 mg | ORAL_TABLET | Freq: Two times a day (BID) | ORAL | 3 refills | Status: DC

## 2018-07-12 NOTE — Telephone Encounter (Signed)
Reviewed Dr. Elmarie Shiley advice with patient. She requests a refill of carvedilol because she is almost out. I advised that I will also send lisinopril 5 mg tabs for her to start tomorrow. She verbalized agreement with plan and will call back with questions or concerns.

## 2018-07-12 NOTE — Telephone Encounter (Signed)
Called patient to discuss her BP readings which have been low since starting Lisinopril 10 mg on 3/27. She reports she has been taking lisinopril at night and has been afraid to go to sleep because of low BP. Reports BP of 104/74 mmHg on 4/4 and similar readings since that time. I advised her to skip lisinopril today and start taking it in the morning tomorrow. I advised her to continue to monitor BP and HR and to call back if numbers do not improve. She states she has also reduced sodium in diet which I advised is likely helping to lower her BP. She asks about atorvastatin and is not sure why she is taking it. I asked her to review lab results from PCP because I cannot find a lipid level in the chart. I advised it also could have been started due to coronary or other calcifications seen on a CT. She will look through her records at home and call back to discuss. She thanked me for the call.

## 2018-07-12 NOTE — Telephone Encounter (Signed)
Agree with holding Lisinopril today . Lets have her start back at 1/2 dose tomorrow ( 5 mg a day )  Have her continue to monitor

## 2018-07-12 NOTE — Telephone Encounter (Signed)
° ° °  Patient calling with concerns about BP  Pt c/o BP issue: STAT if pt c/o blurred vision, one-sided weakness or slurred speech  1. What are your last 5 BP readings? 90/55, 98/68, 116/72, 98/65  2. Are you having any other symptoms (ex. Dizziness, headache, blurred vision, passed out)? NO  3. What is your BP issue? Patient states she take meds at 6pm and BP is low at night, requesting device from nurse

## 2018-08-02 ENCOUNTER — Other Ambulatory Visit: Payer: Self-pay | Admitting: Cardiovascular Disease

## 2018-08-03 ENCOUNTER — Encounter (INDEPENDENT_AMBULATORY_CARE_PROVIDER_SITE_OTHER): Payer: Self-pay | Admitting: Orthopaedic Surgery

## 2018-08-03 ENCOUNTER — Other Ambulatory Visit: Payer: Self-pay

## 2018-08-03 ENCOUNTER — Ambulatory Visit (INDEPENDENT_AMBULATORY_CARE_PROVIDER_SITE_OTHER): Payer: BLUE CROSS/BLUE SHIELD | Admitting: Family Medicine

## 2018-08-03 DIAGNOSIS — M7502 Adhesive capsulitis of left shoulder: Secondary | ICD-10-CM

## 2018-08-03 NOTE — Progress Notes (Signed)
[name], female   DOB: 1966-01-07, 53 y.o.   MRN: [id number]    Jessica Parks is 53 days status post left shoulder arthroscopic lysis of adhesions and manipulation for adhesive capsulitis.  She is overall doing well.  She has been progressing slowly with physical therapy in terms of range of motion.  Her surgical scars are fully healed.  She has no motor or sensory deficits.  Her overall range of motion has improved incrementally but this has not been a significant improvement.  Today we discussed a glenohumeral injection to hopefully help facilitate with joint mobilization and overall function.  She would like to think about this and she also discussed this with Dr. Junius Roads.  Please see his clinic note from today.

## 2018-08-03 NOTE — Progress Notes (Signed)
Subjective: She is here to discuss possible left glenohumeral injection.  She has adhesive capsulitis but states that her pain is almost nonexistent.  She is reluctant to have another injection.  She is still working with physical therapy and feels that although her progress is slow, her range of motion is still improving.  From a cardiac standpoint she feels like she is close to normal again.  She was able to hit tennis balls against a wall the other day without any shortness of breath.  Objective: She still has adhesive capsulitis left shoulder with abduction about 60 degrees, external rotation 20 and internal rotation about 30.  She has a little bit of pain at the extremes.  Impression: Adhesive capsulitis left shoulder  Plan: Continue with physical therapy and home exercises.  If pain becomes worse or if she backtracks with range of motion, she will come in for a humeral injection.  Otherwise follow-up as needed.

## 2018-08-16 ENCOUNTER — Telehealth: Payer: Self-pay | Admitting: Cardiovascular Disease

## 2018-08-16 NOTE — Telephone Encounter (Signed)
Spoke with patient who states she is feeling well but called to ask if late August is too long for her to wait to see Dr. Acie Fredrickson to check on her heart. I advised that at the time of her virtual appointment in early April, we did not know when we would be able to see her in person due to Covid restrictions. I advised that Dr. Acie Fredrickson would like her next visit to be in person unless she is having problems that need immediate attention by telephone which she denies. I advised we may be able to see her sooner than August and that I will call her in a couple of weeks when we have direction on scheduling in office visits. She verbalized understanding and agreement and thanked me for the call.

## 2018-08-16 NOTE — Telephone Encounter (Signed)
New Message     Pt is calling about her appt that Is scheduled in August and she would like to be seen in the office sooner    Please call

## 2018-09-17 ENCOUNTER — Other Ambulatory Visit: Payer: Self-pay

## 2018-09-17 ENCOUNTER — Telehealth: Payer: Self-pay | Admitting: Cardiovascular Disease

## 2018-09-17 NOTE — Telephone Encounter (Signed)
  Pt c/o BP issue: STAT if pt c/o blurred vision, one-sided weakness or slurred speech  1. What are your last 5 BP readings? 116/69, 110/75, 113/73, 102/66, 102/69  2. Are you having any other symptoms (ex. Dizziness, headache, blurred vision, passed out)? no  3. What is your BP issue? Patient has taken herself off of her bp meds. She states that it has been running good but when she goes out to walk it drops really low below 100. She is concerned her bp is too low and is not sure what she should do.

## 2018-09-17 NOTE — Telephone Encounter (Signed)
I spoke to the patient with Dr Elmarie Shiley recommendations.  She verbalized understanding.

## 2018-09-17 NOTE — Telephone Encounter (Signed)
She may hold her Lisinopril as long as her BP is not elevated.  Agree with hydrating before and after ( and possibly during )  exercise

## 2018-09-17 NOTE — Telephone Encounter (Signed)
I spoke to the patient who had stopped her Lisinopril on 5/22 because she had concern about low BP.  During the month of May, her BP at times was 95, 98, 99 systolic, but she never had symptoms of passing out or dizziness.  She did have "strange feelings in her forehead".    She exercises (walks/runs) a lot, but never realized that she needs to hydrate with that.  She will begin more hydration and keep [place] updated.

## 2018-09-22 ENCOUNTER — Other Ambulatory Visit: Payer: Self-pay | Admitting: Obstetrics and Gynecology

## 2018-09-22 ENCOUNTER — Other Ambulatory Visit: Payer: Self-pay

## 2018-09-22 ENCOUNTER — Ambulatory Visit
Admission: RE | Admit: 2018-09-22 | Discharge: 2018-09-22 | Disposition: A | Discharge status: Home / Self Care | Payer: BC Managed Care – PPO | Source: Ambulatory Visit | Attending: Obstetrics and Gynecology | Admitting: Obstetrics and Gynecology

## 2018-09-22 DIAGNOSIS — N6002 Solitary cyst of left breast: Secondary | ICD-10-CM

## 2018-11-01 ENCOUNTER — Other Ambulatory Visit: Payer: Self-pay | Admitting: Cardiovascular Disease

## 2018-11-18 ENCOUNTER — Encounter: Payer: BLUE CROSS/BLUE SHIELD | Admitting: Internal Medicine

## 2018-12-01 ENCOUNTER — Ambulatory Visit: Payer: BLUE CROSS/BLUE SHIELD | Admitting: Cardiovascular Disease

## 2018-12-02 NOTE — Progress Notes (Signed)
Cardiology Office Note:    Date:  12/03/2018   ID:  Jessica Parks, DOB [birth year], MRN [id number]  PCP:  Glendale Chard, MD  Cardiologist:  Mertie Moores, MD  Electrophysiologist:  None   Referring MD: Glendale Chard, MD   Chief Complaint  Patient presents with  . Congestive Heart Failure     Aug. 28, 2020    Jessica Parks is a 53 y.o. 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   Echocardiogram on June 12, 2018 revealed mildly depressed left ventricular systolic function with an ejection fraction of 40 to 45%.  Threre was severe hypokinesis in the basal and mid inferoseptal, anterior septal, basal anterior walls.   I saw her as a virtual visit in March, 2020 . We started her on Lisinopril at this visit.  She is also on Coreg.  But she ran out of the coreg for the past month.  She stopped the Lisinopril because her BP was very low  Walks every day  - 3-5 miles a day  No CP   Past Medical History:  Diagnosis Date  . Anemia   . Anxiety disorder   . Arthritis   . Frozen shoulder    left- PT only 04/28/2018  . Hemorrhoids     Past Surgical History:  Procedure Laterality Date  . BREAST BIOPSY Left 2014   benign  . SHOULDER ARTHROSCOPY WITH SUBACROMIAL DECOMPRESSION Left 06/11/2018   Procedure: LEFT SHOULDER ARTHROSCOPY WITH EXTENSIVE DEBRIDEMENT, LYSIS OF ADHESIONS, SUBACROMIAL DECOMPRESSION AND MANIPULATION UNDER ANESTHESIA;  Surgeon: Leandrew Koyanagi, MD;  Location: [facility];  Service: Orthopedics;  Laterality: Left;  . TUBAL LIGATION      Current Medications: Current Meds  Medication Sig  . aspirin 81 MG chewable tablet Chew 1 tablet (81 mg total) by mouth daily.  . Biotin w/ Vitamins C & E (HAIR/SKIN/NAILS PO) Take 1 tablet by mouth daily.   . carvedilol (COREG) 3.125 MG tablet Take 1 tablet (3.125 mg total) by mouth 2 (two) times daily with a meal.  . ibuprofen  (ADVIL,MOTRIN) 800 MG tablet Take 1 tablet (800 mg total) by mouth every 8 (eight) hours as needed.  . Multiple Vitamin (MULTIVITAMIN) tablet Take 1 tablet by mouth daily.     Allergies:   Patient has no known allergies.   Social History   Socioeconomic History  . Marital status: Married    Spouse name: Not on file  . Number of children: 2  . Years of education: Not on file  . Highest education level: Not on file  Occupational History  . Occupation: unemployed    Fish farm manager: [employer]  Social Needs  . Financial resource strain: Not on file  . Food insecurity    Worry: Not on file    Inability: Not on file  . Transportation needs    Medical: Not on file    Non-medical: Not on file  Tobacco Use  . Smoking status: Never Smoker  . Smokeless tobacco: Never Used  Substance and Sexual Activity  . Alcohol use: Yes    Comment: socially  . Drug use: No  . Sexual activity: Yes    Partners: Male    Birth control/protection: Surgical  Lifestyle  . Physical activity    Days per week: Not on file    Minutes per session: Not on file  . Stress: Not on file  Relationships  .  Social Herbalist on phone: Not on file    Gets together: Not on file    Attends religious service: Not on file    Active member of club or organization: Not on file    Attends meetings of clubs or organizations: Not on file    Relationship status: Not on file  Other Topics Concern  . Not on file  Social History Narrative  . Not on file     Family History: The patient's family history includes Breast cancer in her maternal aunt, maternal aunt, and maternal grandmother; Diabetes in her maternal aunt. There is no history of Colon cancer or Rectal cancer.  ROS:   Please see the history of present illness.     All other systems reviewed and are negative.  EKGs/Labs/Other Studies Reviewed:    The following studies were reviewed today:   EKG:      Recent Labs: 06/11/2018: ALT 13 06/13/2018:  Hemoglobin 8.6; Platelets 143 06/14/2018: BUN 7; Creatinine, Ser 0.77; Magnesium 1.9; Potassium 3.7; Sodium 137  Recent Lipid Panel No results found for: CHOL, TRIG, HDL, CHOLHDL, VLDL, LDLCALC, LDLDIRECT  Physical Exam:    VS:  BP 128/80   Pulse 94   Ht 5' 6.75" (1.695 m)   Wt 158 lb 6.4 oz (71.8 kg)   SpO2 98%   BMI 25.00 kg/m     Wt Readings from Last 3 Encounters:  12/03/18 158 lb 6.4 oz (71.8 kg)  07/05/18 158 lb 6.4 oz (71.8 kg)  07/02/18 160 lb (72.6 kg)     GEN:  Well nourished, well developed in no acute distress HEENT: Normal NECK: No JVD; No carotid bruits LYMPHATICS: No lymphadenopathy CARDIAC: RRR, no murmurs, rubs, gallops RESPIRATORY:  Clear to auscultation without rales, wheezing or rhonchi  ABDOMEN: Soft, non-tender, non-distended MUSCULOSKELETAL:  No edema; No deformity  SKIN: Warm and dry NEUROLOGIC:  Alert and oriented x 3 PSYCHIATRIC:  Normal affect   ASSESSMENT:    1. Nonischemic cardiomyopathy ([place])    PLAN:    In order of problems listed above:  1. Stress-induced myocardial infarction: Patient had an excess dose of epinephrine and Marcaine injected into her shoulder joint.  The excess epinephrine caused profound vasoconstriction including causing a stress-induced non-ST segment elevation myocardial infarction.  She was supported on the ventilator for several days.  Echocardiogram at that time revealed an ejection fraction of 40 to 45%.  She eventually recovered and is healed up nicely.  She is now walking 3 to 5 miles every day.  She denies any chest pain or shortness of breath.  We had her on low-dose carvedilol but she stopped it because of the worry that it might be causing her blood pressure to go low.  I would like for her to restart the carvedilol at 3.125 mg twice a day.  She will keep a blood pressure and heart rate log.  We will get an echocardiogram to see how her left ventricle has recovered.  I will plan on seeing her again in 6 months.   If she is doing well then we can see her on an as-needed basis or alternatively continue to see her on a yearly basis if she would like.   Medication Adjustments/Labs and Tests Ordered: Current medicines are reviewed at length with the patient today.  Concerns regarding medicines are outlined above.  Orders Placed This Encounter  Procedures  . ECHOCARDIOGRAM COMPLETE   No orders of the defined types were placed in  this encounter.   Patient Instructions  Your physician has recommended you make the following change in your medication: RESTART CARVEDILOL 3.125 MG TWICE DAILY   Your physician has requested that you have an echocardiogram. Echocardiography is a painless test that uses sound waves to create images of your heart. It provides your doctor with information about the size and shape of your heart and how well your heart's chambers and valves are working. This procedure takes approximately one hour. There are no restrictions for this procedure.  Your physician wants you to follow-up in: [place] will receive a reminder letter in the mail two months in advance. If you don't receive a letter, please call our office to schedule the follow-up appointment.    Signed, Mertie Moores, MD  12/03/2018 12:09 PM    [place]

## 2018-12-03 ENCOUNTER — Encounter: Payer: Self-pay | Admitting: Cardiovascular Disease

## 2018-12-03 ENCOUNTER — Ambulatory Visit: Payer: BC Managed Care – PPO | Admitting: Cardiovascular Disease

## 2018-12-03 ENCOUNTER — Other Ambulatory Visit: Payer: Self-pay

## 2018-12-03 VITALS — BP 128/80 | HR 94 | Ht 66.75 in | Wt 158.4 lb

## 2018-12-03 DIAGNOSIS — I428 Other cardiomyopathies: Secondary | ICD-10-CM | POA: Diagnosis not present

## 2018-12-03 DIAGNOSIS — I214 Non-ST elevation (NSTEMI) myocardial infarction: Secondary | ICD-10-CM | POA: Diagnosis not present

## 2018-12-03 NOTE — Patient Instructions (Signed)
Your physician has recommended you make the following change in your medication: RESTART CARVEDILOL 3.125 MG TWICE DAILY   Your physician has requested that you have an echocardiogram. Echocardiography is a painless test that uses sound waves to create images of your heart. It provides your doctor with information about the size and shape of your heart and how well your heart's chambers and valves are working. This procedure takes approximately one hour. There are no restrictions for this procedure.  Your physician wants you to follow-up in: [place] will receive a reminder letter in the mail two months in advance. If you don't receive a letter, please call our office to schedule the follow-up appointment.

## 2018-12-06 ENCOUNTER — Ambulatory Visit (INDEPENDENT_AMBULATORY_CARE_PROVIDER_SITE_OTHER): Payer: BC Managed Care – PPO | Admitting: Internal Medicine

## 2018-12-06 ENCOUNTER — Other Ambulatory Visit: Payer: Self-pay

## 2018-12-06 ENCOUNTER — Encounter: Payer: Self-pay | Admitting: Internal Medicine

## 2018-12-06 VITALS — BP 128/78 | HR 67 | Temp 98.1°F | Ht 66.75 in | Wt 158.6 lb

## 2018-12-06 DIAGNOSIS — Z0000 Encounter for general adult medical examination without abnormal findings: Secondary | ICD-10-CM | POA: Diagnosis not present

## 2018-12-06 DIAGNOSIS — Z23 Encounter for immunization: Secondary | ICD-10-CM

## 2018-12-06 LAB — POCT URINALYSIS DIPSTICK
Bilirubin, UA: NEGATIVE
Glucose, UA: NEGATIVE
Ketones, UA: NEGATIVE
Leukocytes, UA: NEGATIVE
Nitrite, UA: NEGATIVE
Protein, UA: NEGATIVE
Spec Grav, UA: 1.01 (ref 1.010–1.025)
Urobilinogen, UA: 0.2 E.U./dL
pH, UA: 6.5 (ref 5.0–8.0)

## 2018-12-06 MED ORDER — TETANUS-DIPHTH-ACELL PERTUSSIS 5-2.5-18.5 LF-MCG/0.5 IM SUSP
0.5000 mL | Freq: Once | INTRAMUSCULAR | Status: AC
  Administered 2018-12-06: 16:00:00 0.5 mL via INTRAMUSCULAR

## 2018-12-06 NOTE — Patient Instructions (Signed)
Health Maintenance, Female Adopting a healthy lifestyle and getting preventive care are important in promoting health and wellness. Ask your health care provider about:  The right schedule for you to have regular tests and exams.  Things you can do on your own to prevent diseases and keep yourself healthy. What should I know about diet, weight, and exercise? Eat a healthy diet   Eat a diet that includes plenty of vegetables, fruits, low-fat dairy products, and lean protein.  Do not eat a lot of foods that are high in solid fats, added sugars, or sodium. Maintain a healthy weight Body mass index (BMI) is used to identify weight problems. It estimates body fat based on height and weight. Your health care provider can help determine your BMI and help you achieve or maintain a healthy weight. Get regular exercise Get regular exercise. This is one of the most important things you can do for your health. Most adults should:  Exercise for at least 150 minutes each week. The exercise should increase your heart rate and make you sweat (moderate-intensity exercise).  Do strengthening exercises at least twice a week. This is in addition to the moderate-intensity exercise.  Spend less time sitting. Even light physical activity can be beneficial. Watch cholesterol and blood lipids Have your blood tested for lipids and cholesterol at 53 years of age, then have this test every 5 years. Have your cholesterol levels checked more often if:  Your lipid or cholesterol levels are high.  You are older than 53 years of age.  You are at high risk for heart disease. What should I know about cancer screening? Depending on your health history and family history, you may need to have cancer screening at various ages. This may include screening for:  Breast cancer.  Cervical cancer.  Colorectal cancer.  Skin cancer.  Lung cancer. What should I know about heart disease, diabetes, and high blood  pressure? Blood pressure and heart disease  High blood pressure causes heart disease and increases the risk of stroke. This is more likely to develop in people who have high blood pressure readings, are of African descent, or are overweight.  Have your blood pressure checked: ? Every 3-5 years if you are 18-39 years of age. ? Every year if you are 40 years old or older. Diabetes Have regular diabetes screenings. This checks your fasting blood sugar level. Have the screening done:  Once every three years after age 40 if you are at a normal weight and have a low risk for diabetes.  More often and at a younger age if you are overweight or have a high risk for diabetes. What should I know about preventing infection? Hepatitis B If you have a higher risk for hepatitis B, you should be screened for this virus. Talk with your health care provider to find out if you are at risk for hepatitis B infection. Hepatitis C Testing is recommended for:  Everyone born from 1945 through 1965.  Anyone with known risk factors for hepatitis C. Sexually transmitted infections (STIs)  Get screened for STIs, including gonorrhea and chlamydia, if: ? You are sexually active and are younger than 53 years of age. ? You are older than 53 years of age and your health care provider tells you that you are at risk for this type of infection. ? Your sexual activity has changed since you were last screened, and you are at increased risk for chlamydia or gonorrhea. Ask your health care provider if   you are at risk.  Ask your health care provider about whether you are at high risk for HIV. Your health care provider may recommend a prescription medicine to help prevent HIV infection. If you choose to take medicine to prevent HIV, you should first get tested for HIV. You should then be tested every 3 months for as long as you are taking the medicine. Pregnancy  If you are about to stop having your period (premenopausal) and  you may become pregnant, seek counseling before you get pregnant.  Take 400 to 800 micrograms (mcg) of folic acid every day if you become pregnant.  Ask for birth control (contraception) if you want to prevent pregnancy. Osteoporosis and menopause Osteoporosis is a disease in which the bones lose minerals and strength with aging. This can result in bone fractures. If you are 65 years old or older, or if you are at risk for osteoporosis and fractures, ask your health care provider if you should:  Be screened for bone loss.  Take a calcium or vitamin D supplement to lower your risk of fractures.  Be given hormone replacement therapy (HRT) to treat symptoms of menopause. Follow these instructions at home: Lifestyle  Do not use any products that contain nicotine or tobacco, such as cigarettes, e-cigarettes, and chewing tobacco. If you need help quitting, ask your health care provider.  Do not use street drugs.  Do not share needles.  Ask your health care provider for help if you need support or information about quitting drugs. Alcohol use  Do not drink alcohol if: ? Your health care provider tells you not to drink. ? You are pregnant, may be pregnant, or are planning to become pregnant.  If you drink alcohol: ? Limit how much you use to 0-1 drink a day. ? Limit intake if you are breastfeeding.  Be aware of how much alcohol is in your drink. In the [place] one drink equals one 12 oz bottle of beer (355 mL), one 5 oz glass of wine (148 mL), or one 1 oz glass of hard liquor (44 mL). General instructions  Schedule regular health, dental, and eye exams.  Stay current with your vaccines.  Tell your health care provider if: ? You often feel depressed. ? You have ever been abused or do not feel safe at home. Summary  Adopting a healthy lifestyle and getting preventive care are important in promoting health and wellness.  Follow your health care provider's instructions about healthy  diet, exercising, and getting tested or screened for diseases.  Follow your health care provider's instructions on monitoring your cholesterol and blood pressure. This information is not intended to replace advice given to you by your health care provider. Make sure you discuss any questions you have with your health care provider. Document Released: 10/07/2010 Document Revised: 03/17/2018 Document Reviewed: 03/17/2018 Elsevier Patient Education  2020 Elsevier Inc.  

## 2018-12-06 NOTE — Progress Notes (Signed)
Subjective:     [name] , female    DOB: 08-06-1965 , 53 y.o.   MRN: [id number]   Chief Complaint  Patient presents with  . Annual Exam    HPI  She is here today for a full physical examination. She is followed by Dr. Garwin Brothers for her GYN care.  Her last pap smear was in Dec 2019. Unfortunately, since her last visit, she suffered stress induced cardiomyopathy due to overdose of local epinephrine injected into her shoulder joint. This occurred during hospitalization March 2020. She has since been followed by Cardiology and is doing well. She has since weaned herself off of lisinopril. She feels well and is walking 3 miles daily.     Past Medical History:  Diagnosis Date  . Anemia   . Anxiety disorder   . Arthritis   . Frozen shoulder    left- PT only 04/28/2018  . Hemorrhoids      Family History  Problem Relation Age of Onset  . Breast cancer Maternal Grandmother   . Breast cancer Maternal Aunt   . Breast cancer Maternal Aunt   . Diabetes Maternal Aunt   . Hypertension Father   . Colon cancer Neg Hx   . Rectal cancer Neg Hx      Current Outpatient Medications:  .  Biotin w/ Vitamins C & E (HAIR/SKIN/NAILS PO), Take 1 tablet by mouth daily. , Disp: , Rfl:  .  carvedilol (COREG) 3.125 MG tablet, Take 1 tablet (3.125 mg total) by mouth 2 (two) times daily with a meal., Disp: 180 tablet, Rfl: 3 .  Multiple Vitamin (MULTIVITAMIN) tablet, Take 1 tablet by mouth daily., Disp: , Rfl:  .  aspirin 81 MG chewable tablet, Chew 1 tablet (81 mg total) by mouth daily. (Patient not taking: Reported on 12/06/2018), Disp: 30 tablet, Rfl: 2 .  ibuprofen (ADVIL,MOTRIN) 800 MG tablet, Take 1 tablet (800 mg total) by mouth every 8 (eight) hours as needed. (Patient not taking: Reported on 12/06/2018), Disp: 90 tablet, Rfl: 3  Current Facility-Administered Medications:  .  Tdap (BOOSTRIX) injection 0.5 mL, 0.5 mL, Intramuscular, Once, Glendale Chard, MD   No Known Allergies    Review of Systems  Constitutional: Negative.   HENT: Negative.   Eyes: Negative.   Respiratory: Negative.   Cardiovascular: Negative.   Endocrine: Negative.   Genitourinary: Negative.   Musculoskeletal: Negative.   Skin: Negative.   Allergic/Immunologic: Negative.   Neurological: Negative.   Hematological: Negative.   Psychiatric/Behavioral: Negative.      Today's Vitals   12/06/18 1457  BP: 128/78  Pulse: 67  Temp: 98.1 F (36.7 C)  TempSrc: Oral  SpO2: 97%  Weight: 158 lb 9.6 oz (71.9 kg)  Height: 5' 6.75" (1.695 m)  PainSc: 0-No pain   Body mass index is 25.03 kg/m.   Objective:  Physical Exam Vitals signs and nursing note reviewed.  Constitutional:      Appearance: Normal appearance.  HENT:     Head: Normocephalic and atraumatic.     Right Ear: Tympanic membrane, ear canal and external ear normal.     Left Ear: Tympanic membrane, ear canal and external ear normal.     Nose: Nose normal.     Mouth/Throat:     Mouth: Mucous membranes are moist.     Pharynx: Oropharynx is clear.  Eyes:     Extraocular Movements: Extraocular movements intact.     Conjunctiva/sclera: Conjunctivae normal.     Pupils: Pupils  are equal, round, and reactive to light.  Neck:     Musculoskeletal: Normal range of motion and neck supple.  Cardiovascular:     Rate and Rhythm: Normal rate and regular rhythm.     Pulses: Normal pulses.     Heart sounds: Normal heart sounds.  Pulmonary:     Effort: Pulmonary effort is normal.     Breath sounds: Normal breath sounds.  Chest:     Breasts: Tanner Score is 5.        Right: Normal. No swelling, bleeding, inverted nipple, mass, nipple discharge or skin change.        Left: Normal. No swelling, bleeding, inverted nipple, mass, nipple discharge or skin change.  Abdominal:     General: Abdomen is flat. Bowel sounds are normal.     Palpations: Abdomen is soft.  Genitourinary:    Comments: deferred Musculoskeletal: Normal range of  motion.  Skin:    General: Skin is warm and dry.  Neurological:     General: No focal deficit present.     Mental Status: She is alert and oriented to person, place, and time.  Psychiatric:        Mood and Affect: Mood normal.        Behavior: Behavior normal.         Assessment And Plan:     1. Routine general medical examination at health care facility  A full exam was performed.  Importance of monthly self breast exams was discussed with the patient.  PATIENT HAS BEEN ADVISED TO GET 30-45 MINUTES REGULAR EXERCISE NO LESS THAN FOUR TO FIVE DAYS PER WEEK - BOTH WEIGHTBEARING EXERCISES AND AEROBIC ARE RECOMMENDED.  SHE WAS ADVISED TO FOLLOW A HEALTHY DIET WITH AT LEAST SIX FRUITS/VEGGIES PER DAY, DECREASE INTAKE OF RED MEAT, AND TO INCREASE FISH INTAKE TO TWO DAYS PER WEEK.  MEATS/FISH SHOULD NOT BE FRIED, BAKED OR BROILED IS PREFERABLE.  I SUGGEST WEARING SPF 50 SUNSCREEN ON EXPOSED PARTS AND ESPECIALLY WHEN IN THE DIRECT SUNLIGHT FOR AN EXTENDED PERIOD OF TIME.  PLEASE AVOID FAST FOOD RESTAURANTS AND INCREASE YOUR WATER INTAKE.  - CMP14+EGFR - CBC - Lipid panel - Hemoglobin A1c - TSH  2. Immunization due  - Tdap (BOOSTRIX) injection 0.5 mL       [name], MD    THE PATIENT IS ENCOURAGED TO PRACTICE SOCIAL DISTANCING DUE TO THE COVID-19 PANDEMIC.

## 2018-12-07 LAB — CMP14+EGFR
ALT: 9 IU/L (ref 0–32)
AST: 16 IU/L (ref 0–40)
Albumin/Globulin Ratio: 1.9 (ref 1.2–2.2)
Albumin: 4.7 g/dL (ref 3.8–4.9)
Alkaline Phosphatase: 68 IU/L (ref 39–117)
BUN/Creatinine Ratio: 15 (ref 9–23)
BUN: 10 mg/dL (ref 6–24)
Bilirubin Total: 0.5 mg/dL (ref 0.0–1.2)
CO2: 21 mmol/L (ref 20–29)
Calcium: 9.4 mg/dL (ref 8.7–10.2)
Chloride: 102 mmol/L (ref 96–106)
Creatinine, Ser: 0.68 mg/dL (ref 0.57–1.00)
GFR calc Af Amer: 116 mL/min/{1.73_m2} (ref >59)
GFR calc non Af Amer: 101 mL/min/{1.73_m2} (ref >59)
Globulin, Total: 2.5 g/dL (ref 1.5–4.5)
Glucose: 107 mg/dL — ABNORMAL HIGH (ref 65–99)
Potassium: 4 mmol/L (ref 3.5–5.2)
Sodium: 139 mmol/L (ref 134–144)
Total Protein: 7.2 g/dL (ref 6.0–8.5)

## 2018-12-07 LAB — CBC
Hematocrit: 37 % (ref 34.0–46.6)
Hemoglobin: 11.8 g/dL (ref 11.1–15.9)
MCH: 27.6 pg (ref 26.6–33.0)
MCHC: 31.9 g/dL (ref 31.5–35.7)
MCV: 87 fL (ref 79–97)
Platelets: 189 10*3/uL (ref 150–450)
RBC: 4.27 x10E6/uL (ref 3.77–5.28)
RDW: 11.6 % — ABNORMAL LOW (ref 11.7–15.4)
WBC: 4 10*3/uL (ref 3.4–10.8)

## 2018-12-07 LAB — HEMOGLOBIN A1C
Est. average glucose Bld gHb Est-mCnc: 103 mg/dL
Hgb A1c MFr Bld: 5.2 % (ref 4.8–5.6)

## 2018-12-07 LAB — LIPID PANEL
Chol/HDL Ratio: 3.1 ratio (ref 0.0–4.4)
Cholesterol, Total: 176 mg/dL (ref 100–199)
HDL: 56 mg/dL (ref >39)
LDL Chol Calc (NIH): 97 mg/dL (ref 0–99)
Triglycerides: 131 mg/dL (ref 0–149)
VLDL Cholesterol Cal: 23 mg/dL (ref 5–40)

## 2018-12-07 LAB — TSH: TSH: 2.12 u[IU]/mL (ref 0.450–4.500)

## 2018-12-08 ENCOUNTER — Other Ambulatory Visit: Payer: Self-pay

## 2018-12-08 ENCOUNTER — Ambulatory Visit (HOSPITAL_COMMUNITY): Payer: BC Managed Care – PPO | Attending: Cardiovascular Disease

## 2018-12-08 DIAGNOSIS — I428 Other cardiomyopathies: Secondary | ICD-10-CM

## 2019-01-25 ENCOUNTER — Other Ambulatory Visit (INDEPENDENT_AMBULATORY_CARE_PROVIDER_SITE_OTHER): Payer: Self-pay | Admitting: Physician Assistant

## 2019-02-08 LAB — HM PAP SMEAR

## 2019-03-09 IMAGING — MG DIGITAL DIAGNOSTIC UNILATERAL LEFT MAMMOGRAM WITH TOMO AND CAD
6 series · 6 of 18 positions shown · non-contrast
Comparison: Previous exam(s).

CLINICAL DATA: 52-year-old female recalled from screening mammogram
dated 03/15/2018 for a possible left breast mass.

EXAM:
DIGITAL DIAGNOSTIC LEFT MAMMOGRAM WITH CAD AND TOMO
ULTRASOUND LEFT BREAST

[L XCCL synth-2D]
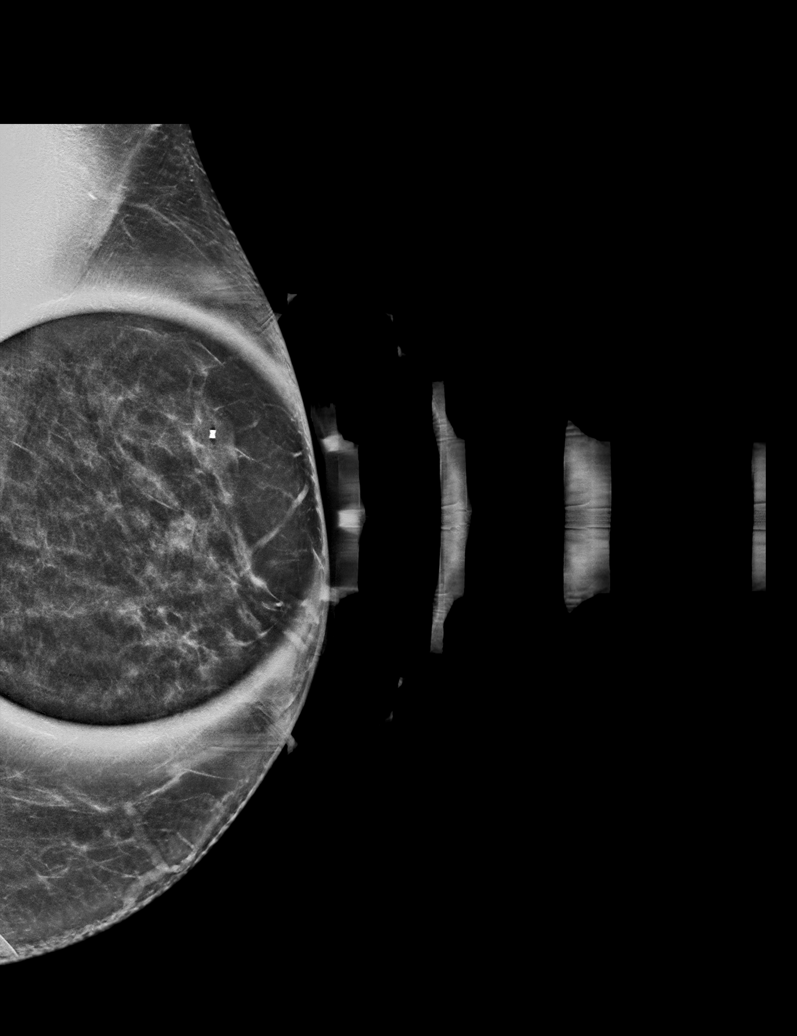

[L CC synth-2D]
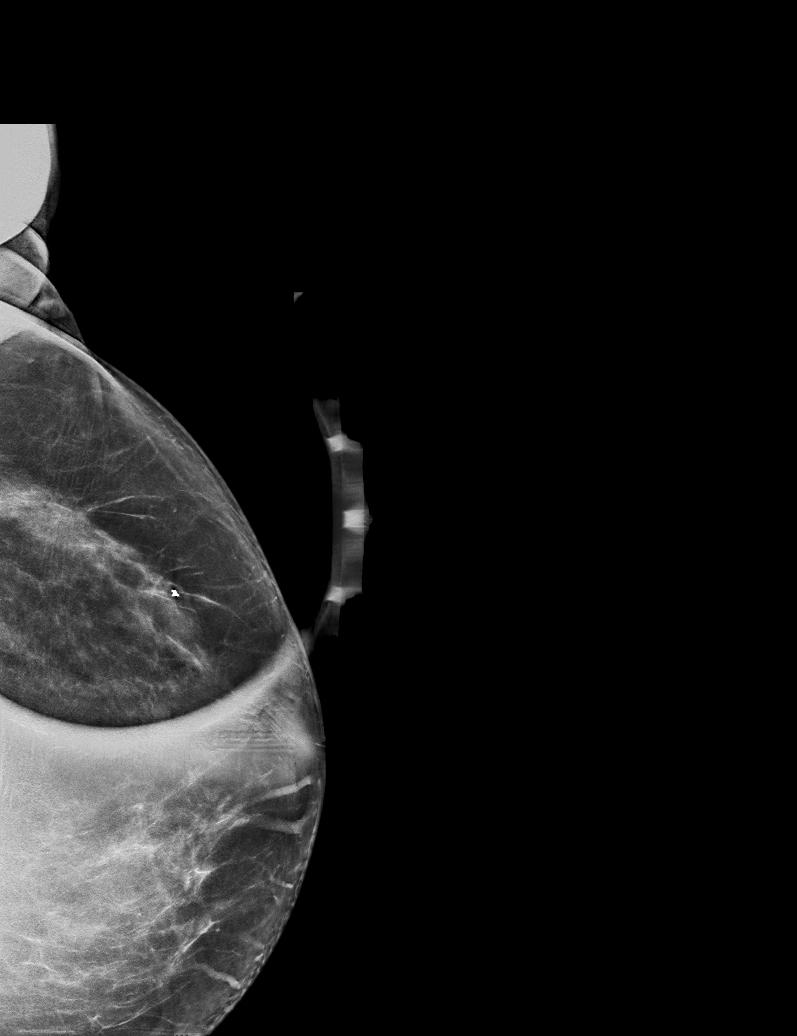

[L ML synth-2D]
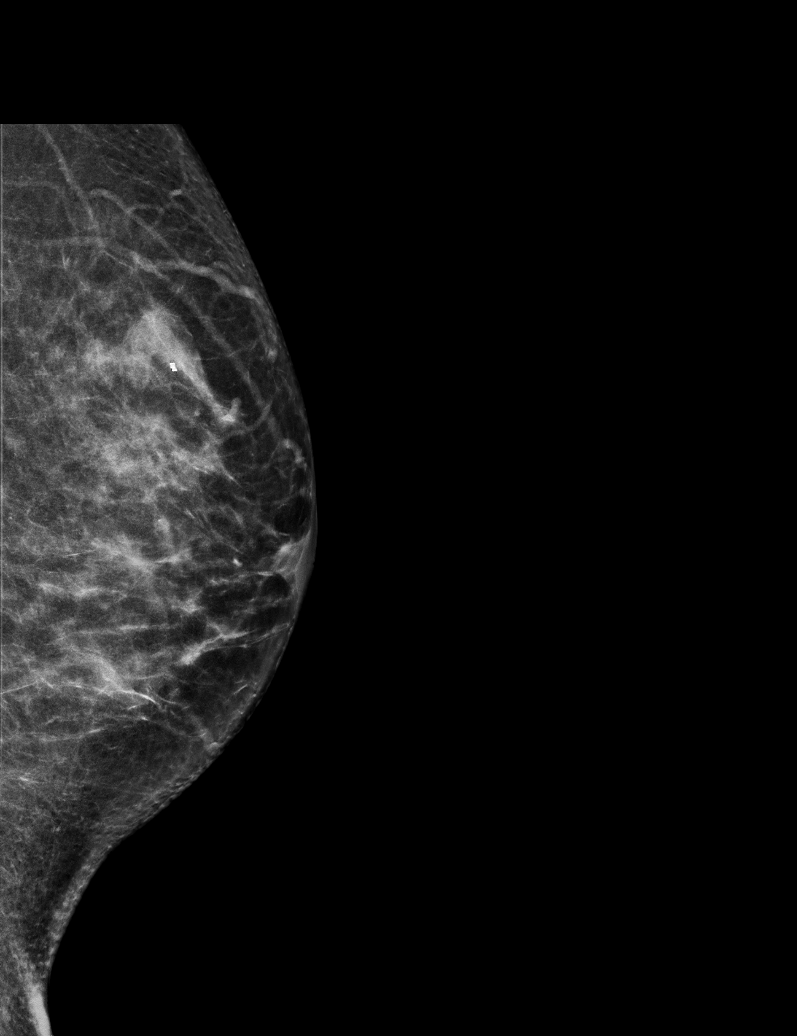

[L CC tomo · tomo slice 37/74.0]
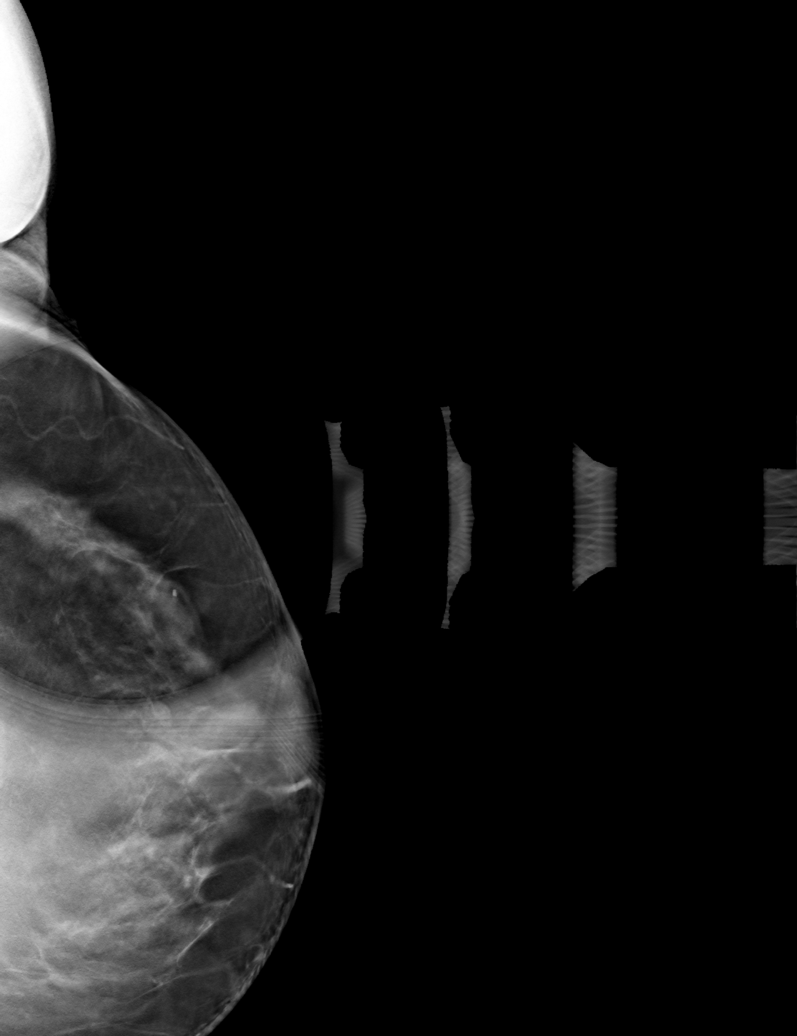

[L ML tomo · tomo slice 37/72.0]
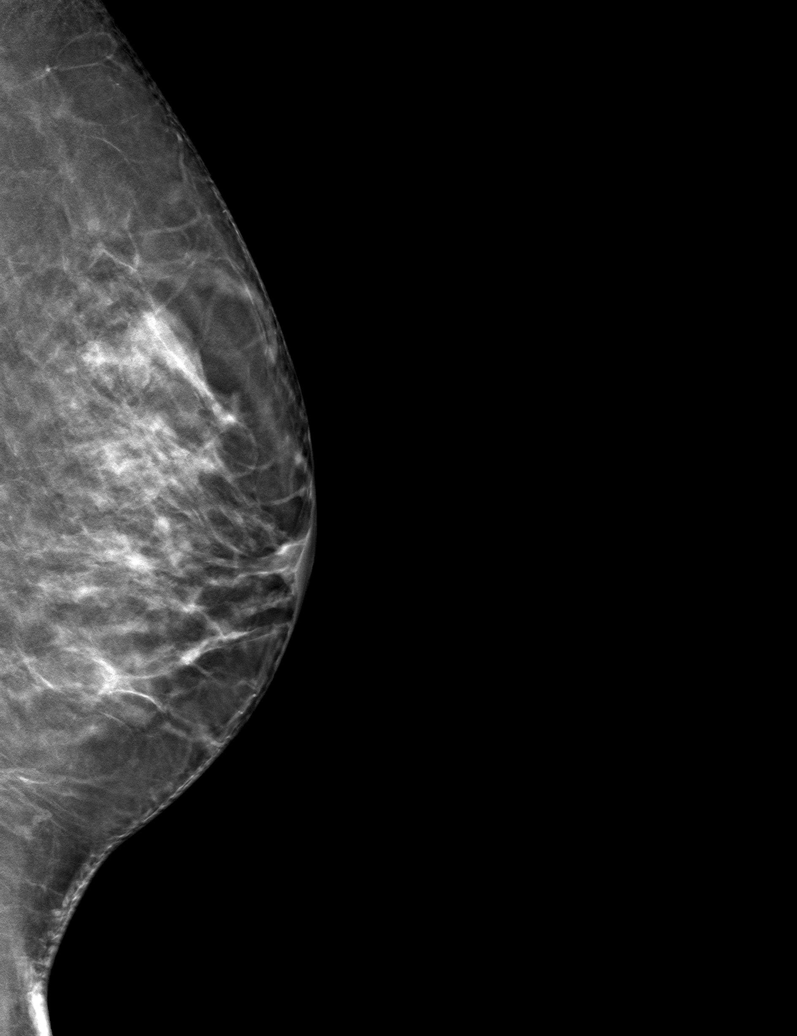

[L XCCL tomo · tomo slice 35/69.0]
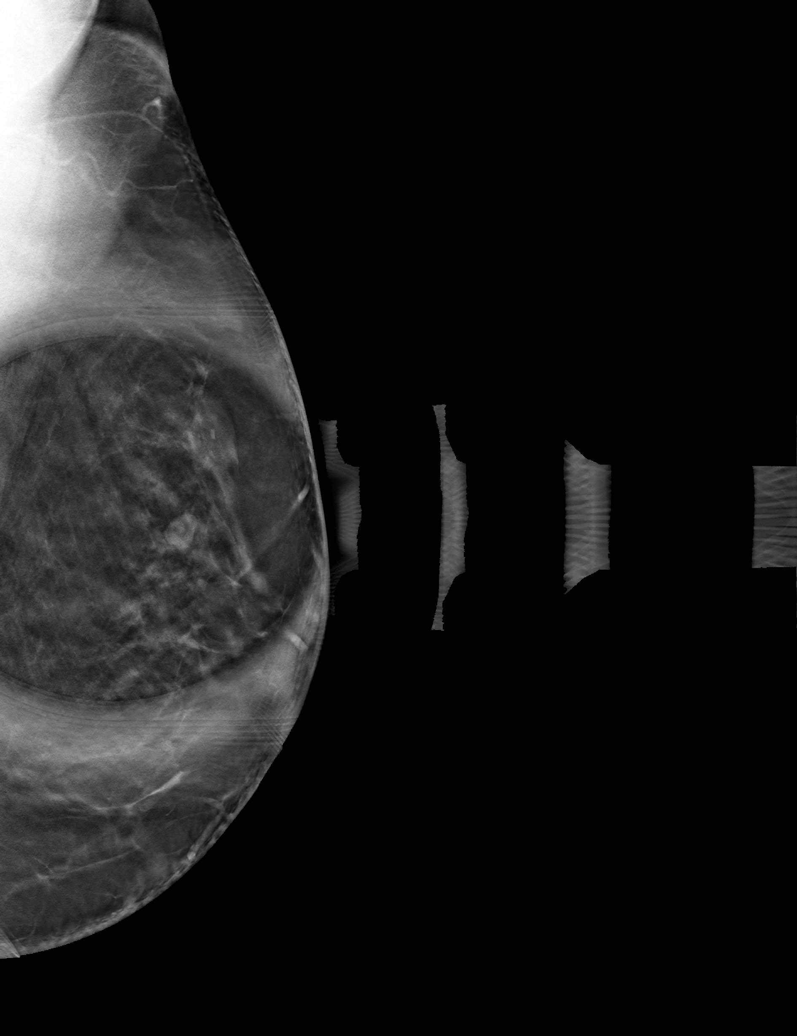

[6 of 18 positions shown; findings below may reference images not displayed]

ACR Breast Density Category c: The breast tissue is heterogeneously
dense, which may obscure small masses.
FINDINGS: There is a persistent oval, circumscribed equal density mass in the
superior slightly lateral left breast at middle depth. Further
evaluation with ultrasound was performed.

Mammographic images were processed with CAD.

Targeted ultrasound is performed, showing an oval, circumscribed
hypoechoic mass at the 1 o'clock position 2 cm from the nipple. It
measures 6 x 6 x 4 mm. There is no associated vascularity. Note is
made of posterior acoustic enhancement. This correlates well with
the mammographic finding and is consistent with a minimally
complicated cyst.
IMPRESSION: Probably benign, probable complicated left breast cyst corresponding
with the screening mammographic findings. Recommendation is for
short-term sonographic follow-up.

RECOMMENDATION:
Left breast ultrasound in 6 months.

I have discussed the findings and recommendations with the patient.
Results were also provided in writing at the conclusion of the
visit. If applicable, a reminder letter will be sent to the patient
regarding the next appointment.

BI-RADS CATEGORY  3: Probably benign.

## 2019-03-09 IMAGING — US ULTRASOUND LEFT BREAST LIMITED
1 series · 5 of 5 positions shown · non-contrast
Comparison: Previous exam(s).

CLINICAL DATA: 52-year-old female recalled from screening mammogram
dated 03/15/2018 for a possible left breast mass.

EXAM:
DIGITAL DIAGNOSTIC LEFT MAMMOGRAM WITH CAD AND TOMO
ULTRASOUND LEFT BREAST

[Series 1: ultrasound left breast limited · 0.06mm/px · 5 of 5 slices shown]
[im 1/5]
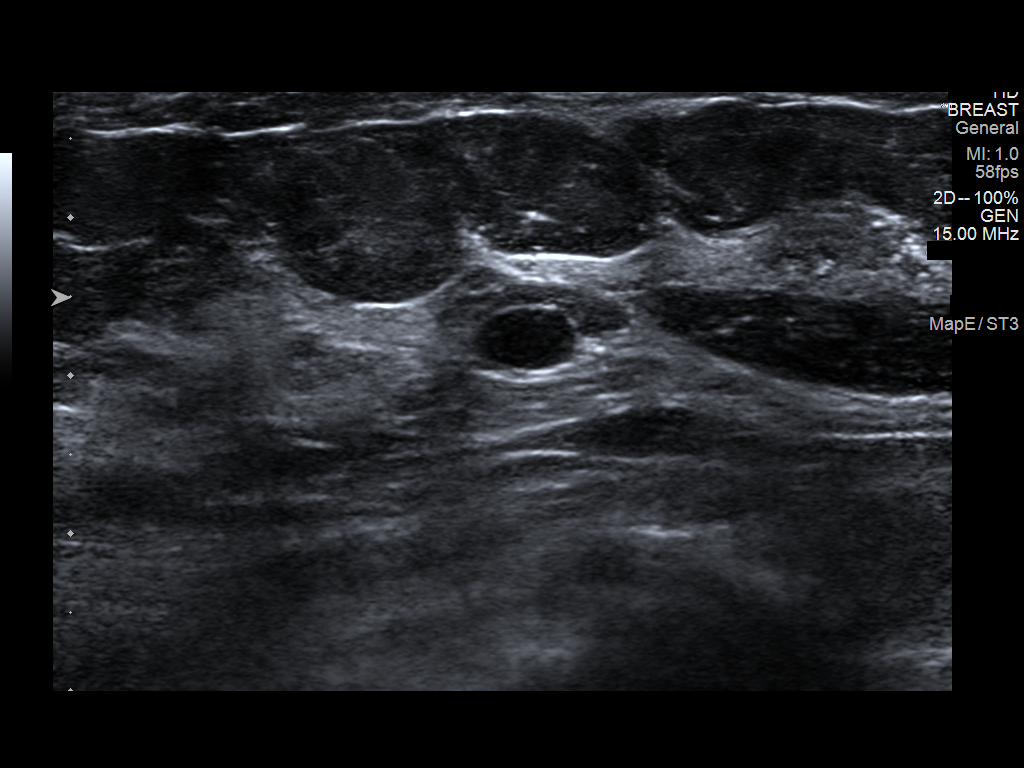
[im 2/5]
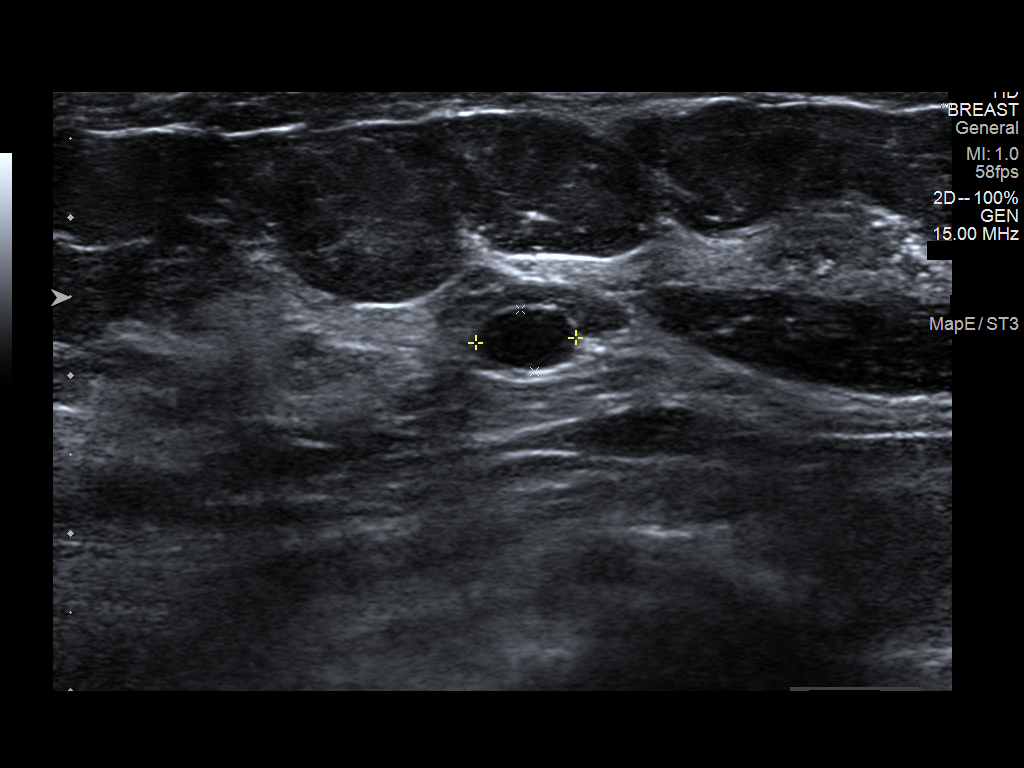
[im 3/5]
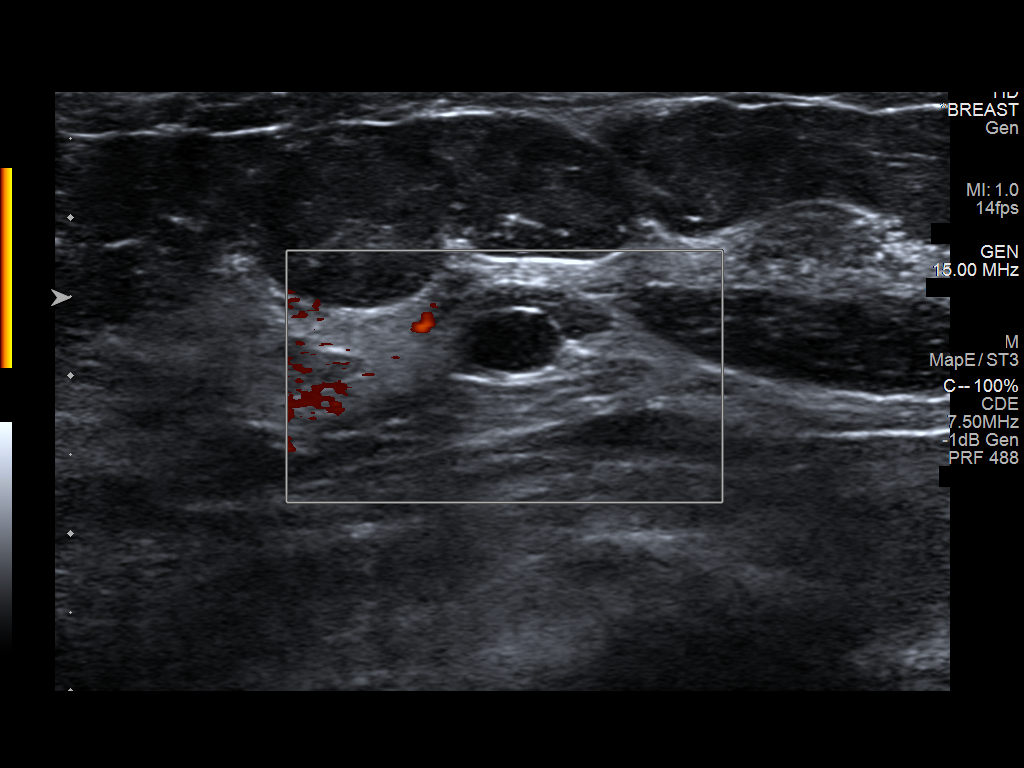
[im 4/5]
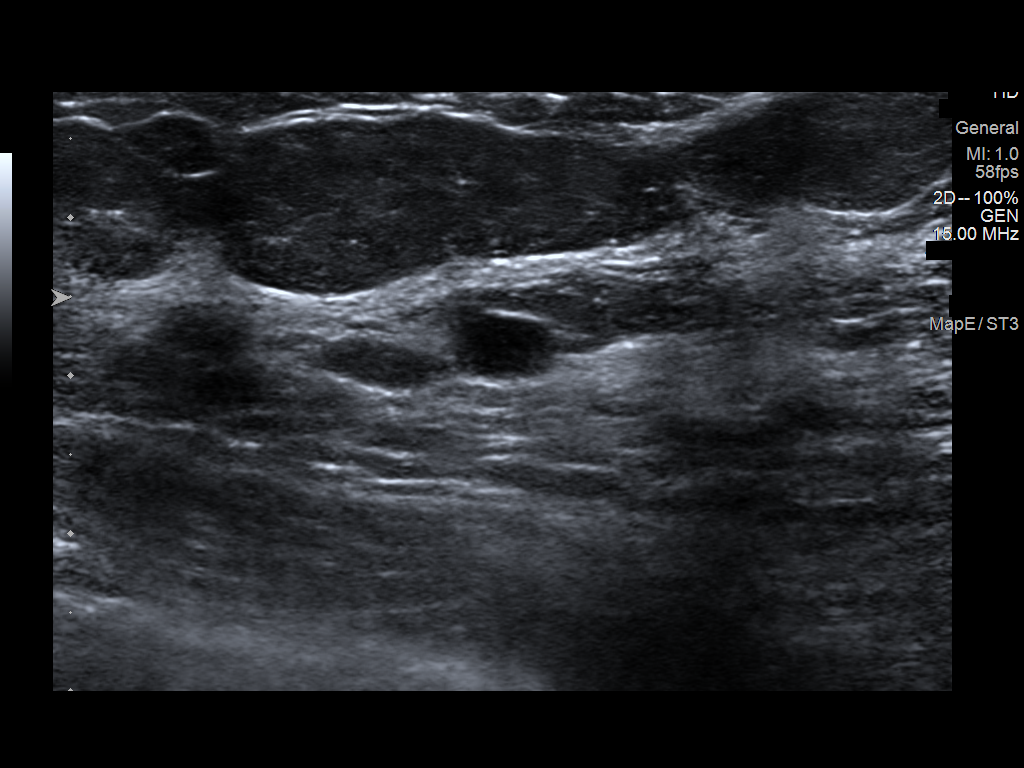
[im 5/5]
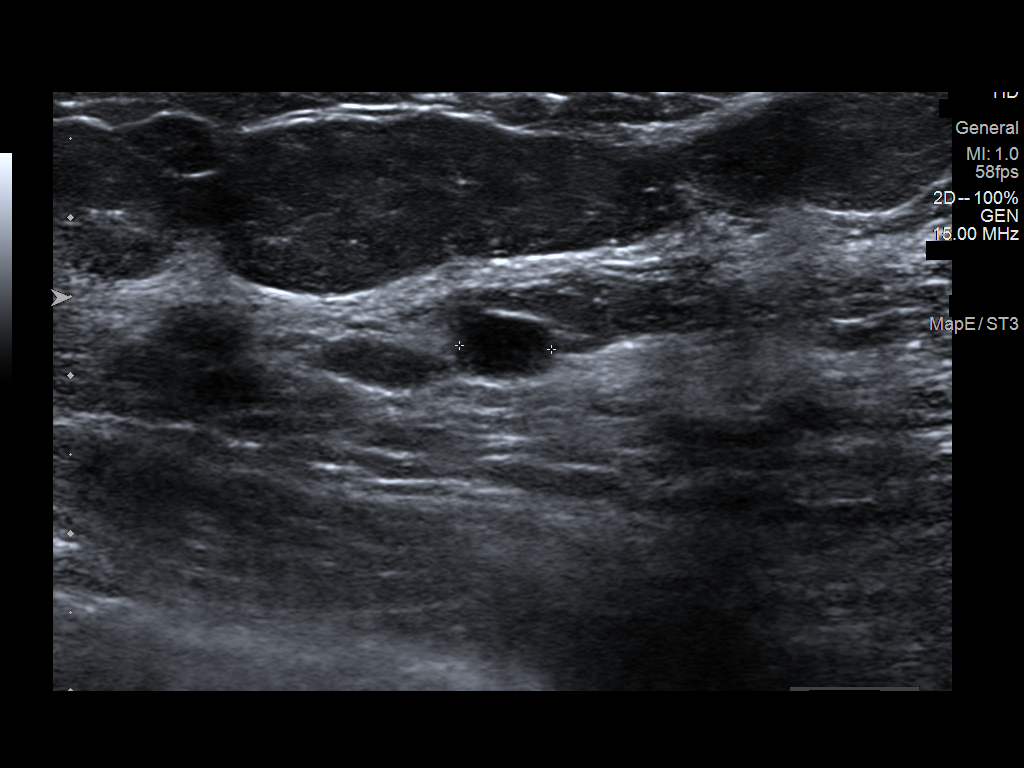

[5 of 5 positions shown; findings below may reference images not displayed]

ACR Breast Density Category c: The breast tissue is heterogeneously
dense, which may obscure small masses.
FINDINGS: There is a persistent oval, circumscribed equal density mass in the
superior slightly lateral left breast at middle depth. Further
evaluation with ultrasound was performed.

Mammographic images were processed with CAD.

Targeted ultrasound is performed, showing an oval, circumscribed
hypoechoic mass at the 1 o'clock position 2 cm from the nipple. It
measures 6 x 6 x 4 mm. There is no associated vascularity. Note is
made of posterior acoustic enhancement. This correlates well with
the mammographic finding and is consistent with a minimally
complicated cyst.
IMPRESSION: Probably benign, probable complicated left breast cyst corresponding
with the screening mammographic findings. Recommendation is for
short-term sonographic follow-up.

RECOMMENDATION:
Left breast ultrasound in 6 months.

I have discussed the findings and recommendations with the patient.
Results were also provided in writing at the conclusion of the
visit. If applicable, a reminder letter will be sent to the patient
regarding the next appointment.

BI-RADS CATEGORY  3: Probably benign.

## 2019-03-29 ENCOUNTER — Ambulatory Visit
Admission: RE | Admit: 2019-03-29 | Discharge: 2019-03-29 | Disposition: A | Discharge status: Home / Self Care | Payer: BC Managed Care – PPO | Source: Ambulatory Visit | Attending: Obstetrics and Gynecology | Admitting: Obstetrics and Gynecology

## 2019-03-29 ENCOUNTER — Other Ambulatory Visit: Payer: Self-pay

## 2019-03-29 DIAGNOSIS — N6002 Solitary cyst of left breast: Secondary | ICD-10-CM

## 2019-05-17 ENCOUNTER — Ambulatory Visit: Payer: Self-pay | Admitting: Surgery

## 2019-05-17 NOTE — H&P (Signed)
Jessica [place] Documented: 05/17/2019 9:25 AM Location: [facility] Patient #: [id number] DOB: 22-Feb-1966 Married / Language: English / Race: Black or African American Female  History of Present Illness Jessica Hector MD; 05/17/2019 10:05 AM) The patient is a 54 year old female who presents with a complaint of anal problems. Note for "Anal problems": ` ` ` Patient sent for surgical consultation at the request of Jessica [place]  Chief Complaint: Persistent hemorrhoid and anal canal polyp. ` ` The patient is a woman I met in [place]. Found to have pedunculated mass at anal crypt most likely consistent with anal canal polyp. Large external hemorrhoid. Surgical consultation offered. [place]. I noted it would require surgery to remove both issues. She was not feeling horribly symptomatic at this time and wish to hold off on any surgery. 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 Followed by cardiology. She was on angiotensin inhibitor and beta blocker. She's weaned herself off that and walks 5 miles a day. Last eye cardiology 6 months ago. Due for 6 month follow-up. She is otherwise well.  The challenges that she feels that hemorrhoid bothering her more. She feels the polyp popping out at times. Can be irritating. Difficult to keep the area clean. She is been compliant on MiraLAX and is moving her bowels more regularly now. At least every other day. She had a follow-up flexible sigmoidoscopy Feb 2020 given concerns by gastroenterology. Again the pedunculated anal crypt polyp and hemorrhoids were noted. Surgical consultation offered. She held off. However she's been feeling worse over the past year and wish to reconsider surgery.  (Review of systems as stated in this history (HPI) or in the review of systems. Otherwise all other 12 point ROS are  negative) ` ` `  This patient encounter took 40 minutes today to perform the following: obtain history, perform exam, review outside records, interpret tests & imaging, counsel the patient on their diagnosis; and, document this encounter, including findings & plan in the electronic health record (EHR).    PRIOR NOTE [place]: Patient sent for surgical consultation by Jessica. Obion Cellar with [facility] gastroenterology. Concern of anal canal mass.  Pleasant but anxious woman. She comes today by herself. She's had a lot of anorectal problems. Tells me she has had anal fissures in the past. He is to have sharp pain with bowel movements. Not recently. Had severe chronic constipation. I also had problems with hemorrhoids. Has had a couple bandings done at [facility] gastroenterology early last year, Jessica. Deatra Ina. Underwent colonoscopy by Jessica [place]. He found a mass in the anal canal. 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 She occasionally uses topical medication and warm soaks for hemorrhoid flares. Since she started on a MiraLAX bowel regimen, she is now moving her bowels about twice a day as opposed to once a week. That has made things much better. Denies rectal bleeding. No itching. No problems with soiling or drainage. She is rather embarrassed about being evaluated. Has been told that she has really bad hemorrhoids in the past.  She had a tubal ligation but no other abdominal surgeries. No personal nor family history of GI/colon cancer, inflammatory bowel disease, irritable bowel syndrome, allergy such as Celiac Sprue, dietary/dairy problems, colitis, ulcers nor gastritis. No recent sick contacts/gastroenteritis. No travel outside the country. No changes in diet. No dysphagia to solids or liquids. No significant heartburn or reflux. No hematochezia, hematemesis,  coffee ground emesis. No evidence of prior gastric/peptic ulceration.   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 Owens Shark, [place]; 05/17/2019 9:25 AM) Breast Biopsy Left. Colon Polyp Removal - Open Shoulder Surgery Left.  Diagnostic Studies History (Tanisha A. Owens Shark, [place]; 05/17/2019 9:25 AM) Colonoscopy 1-5 years ago within last year Mammogram within last year Pap Smear 1-5 years ago  Allergies (Tanisha A. Owens Shark, [place]; 05/17/2019 9:26 AM) No Known Drug Allergies [01/16/[place]]: Allergies Reconciled  Medication History (Tanisha A. Owens Shark, [place]; 05/17/2019 9:26 AM) Multi-Vitamin (Oral) Active. Vitamin A & D (25000-1000UNIT Tablet, Oral daily) Active. Medications Reconciled  Social History (Tanisha A. Owens Shark, [place]; 05/17/2019 9:25 AM) Alcohol use Occasional alcohol use. Caffeine use Carbonated beverages, Tea. No drug use Tobacco use Never smoker.  Family History (Tanisha A. Owens Shark, [place]; 05/17/2019 9:25 AM) Alcohol Abuse Brother. Hypertension Brother, Father.  Pregnancy / Birth History (Tanisha A. Owens Shark, [place]; 05/17/2019 9:25 AM) Age at menarche 17 years. Age of menopause 8-55 Gravida 2 Irregular periods Maternal age 51-30 Para 2  Other Problems Jessica Hector, MD; 05/17/2019 9:30 AM) Hemorrhoids Myocardial infarction     Review of Systems Jessica Hector MD; 05/17/2019 9:30 AM) General Present- Night Sweats. Not Present- Appetite Loss, Chills, Fatigue, Fever, Weight Gain and Weight Loss. Skin Not Present- Change in Wart/Mole, Dryness, Hives, Jaundice, New Lesions, Non-Healing Wounds, Rash and Ulcer. HEENT Not Present- Earache, Hearing Loss, Hoarseness, Nose Bleed, Oral Ulcers, Ringing in the Ears, Seasonal Allergies, Sinus Pain, Sore Throat, Visual Disturbances, Wears glasses/contact lenses and Yellow Eyes. Respiratory Not Present- Bloody sputum, Chronic Cough, Difficulty Breathing, Snoring and Wheezing. Breast Not  Present- Breast Mass, Breast Pain, Nipple Discharge and Skin Changes. Cardiovascular Not Present- Chest Pain, Difficulty Breathing Lying Down, Leg Cramps, Palpitations, Rapid Heart Rate, Shortness of Breath and Swelling of Extremities. Gastrointestinal Present- Constipation and Hemorrhoids. Not Present- Abdominal Pain, Bloating, Bloody Stool, Change in Bowel Habits, Chronic diarrhea, Difficulty Swallowing, Excessive gas, Gets full quickly at meals, Indigestion, Nausea, Rectal Pain and Vomiting. Female Genitourinary Not Present- Frequency, Nocturia, Painful Urination, Pelvic Pain and Urgency. Musculoskeletal Not Present- Back Pain, Joint Pain, Joint Stiffness, Muscle Pain, Muscle Weakness and Swelling of Extremities. Neurological Not Present- Decreased Memory, Fainting, Headaches, Numbness, Seizures, Tingling, Tremor, Trouble walking and Weakness. Psychiatric Not Present- Anxiety, Bipolar, Change in Sleep Pattern, Depression, Fearful and Frequent crying. Endocrine Present- Hair Changes and Hot flashes. Not Present- Cold Intolerance, Excessive Hunger, Heat Intolerance and New Diabetes. Hematology Not Present- Blood Thinners, Easy Bruising, Excessive bleeding, Gland problems, HIV and Persistent Infections.  Vitals (Tanisha A. Brown RMA; 05/17/2019 9:26 AM) 05/17/2019 9:26 AM Weight: 163.4 lb Height: 67in Body Surface Area: 1.86 m Body Mass Index: 25.59 kg/m  Temp.: 98.44F  Pulse: 102 (Regular)  BP: 128/84 (Sitting, Left Arm, Standard)        Physical Exam Jessica Hector MD; 05/17/2019 9:56 AM)  General Mental Status-Alert. General Appearance-Not in acute distress, Not Sickly. Orientation-Oriented X3. Hydration-Well hydrated. Voice-Normal.  Integumentary Global Assessment Upon inspection and palpation of skin surfaces of the - Axillae: non-tender, no inflammation or ulceration, no drainage. and Distribution of scalp and body hair is normal. General  Characteristics Temperature - normal warmth is noted.  Head and Neck Head-normocephalic, atraumatic with no lesions or palpable masses. Face Global Assessment - atraumatic, no absence of expression. Neck Global Assessment - no abnormal movements, no bruit auscultated  on the right, no bruit auscultated on the left, no decreased range of motion, non-tender. Trachea-midline. Thyroid Gland Characteristics - non-tender.  Eye Eyeball - Left-Extraocular movements intact, No Nystagmus - Left. Eyeball - Right-Extraocular movements intact, No Nystagmus - Right. Cornea - Left-No Hazy - Left. Cornea - Right-No Hazy - Right. Sclera/Conjunctiva - Left-No scleral icterus, No Discharge - Left. Sclera/Conjunctiva - Right-No scleral icterus, No Discharge - Right. Pupil - Left-Direct reaction to light normal. Pupil - Right-Direct reaction to light normal.  ENMT Ears Pinna - Left - no drainage observed, no generalized tenderness observed. Pinna - Right - no drainage observed, no generalized tenderness observed. Nose and Sinuses External Inspection of the Nose - no destructive lesion observed. Inspection of the nares - Left - quiet respiration. Inspection of the nares - Right - quiet respiration. Mouth and Throat Lips - Upper Lip - no fissures observed, no pallor noted. Lower Lip - no fissures observed, no pallor noted. Nasopharynx - no discharge present. Oral Cavity/Oropharynx - Tongue - no dryness observed. Oral Mucosa - no cyanosis observed. Hypopharynx - no evidence of airway distress observed.  Chest and Lung Exam Inspection Movements - Normal and Symmetrical. Accessory muscles - No use of accessory muscles in breathing. Palpation Palpation of the chest reveals - Non-tender. Auscultation Breath sounds - Normal and Clear.  Cardiovascular Auscultation Rhythm - Regular. Murmurs & Other Heart Sounds - Auscultation of the heart reveals - No Murmurs and No Systolic  Clicks.  Abdomen Inspection Inspection of the abdomen reveals - No Visible peristalsis and No Abnormal pulsations. Umbilicus - No Bleeding, No Urine drainage. Palpation/Percussion Palpation and Percussion of the abdomen reveal - Soft, Non Tender, No Rebound tenderness, No Rigidity (guarding) and No Cutaneous hyperesthesia. Note: Soft and flat. No umbilical hernia. No diastases. No guarding. No abdominal pain.  Female Genitourinary Sexual Maturity Tanner 5 - Adult hair pattern. Note: No vaginal bleeding nor discharge  Rectal Note: Posterior midline scarring some mild tunneling consistent with prior anal fissure. Mostly healed.   Large right anterior chronically prolapsed internal/external hemorrhoid. Anoscopy reveals a posterior midline anal canal pedunculated mass most likely consistent with hypertrophic anal papilla/anal polyp.  Exam done with assistance of female Medical Assistant in the room. Perianal skin clean with good hygiene. No pruritis ani. No pilonidal disease. No condyloma. No abscess/fistula. Normal sphincter tone. Tolerates digital and anoscopic rectal exam.  Peripheral Vascular Upper Extremity Inspection - Left - No Cyanotic nailbeds - Left, Not Ischemic. Inspection - Right - No Cyanotic nailbeds - Right, Not Ischemic.  Neurologic Neurologic evaluation reveals -normal attention span and ability to concentrate, able to name objects and repeat phrases. Appropriate fund of knowledge , normal sensation and normal coordination. Mental Status Affect - not angry, not paranoid. Cranial Nerves-Normal Bilaterally. Gait-Normal.  Neuropsychiatric Mental status exam performed with findings of-able to articulate well with normal speech/language, rate, volume and coherence, thought content normal with ability to perform basic computations and apply abstract reasoning and no evidence of hallucinations, delusions, obsessions or homicidal/suicidal  ideation. Note: Anxious but consolable  Musculoskeletal Global Assessment Spine, Ribs and Pelvis - no instability, subluxation or laxity. Right Upper Extremity - no instability, subluxation or laxity.  Lymphatic Head & Neck General Head & Neck Lymphatics: Bilateral - Description - No Localized lymphadenopathy. Axillary General Axillary Region: Bilateral - Description - No Localized lymphadenopathy. Femoral & Inguinal Generalized Femoral & Inguinal Lymphatics: Left - Description - No Localized lymphadenopathy. Right - Description - No Localized lymphadenopathy.   Results Remo Lipps C.  Gross MD; 05/17/2019 9:59 AM) Procedures  Name Value Date Hemorrhoids Procedure Anal exam: External Hemorrhoid Internal exam: Mass prolapse Other: Exam done with assistance of female Medical Assistant in the room............Marland KitchenLarge right anterior chronically prolapsed internal/external hemorrhoid. Posterior midline scarring some mild tunneling consistent with prior anal fissure. No significant anal stricture. Digital rectal exam & anoscopy reveals a posterior midline anal canal pedunculated mass most likely consistent with hypertrophic anal papilla/anal polyp............. ............ Perianal skin clean with good hygiene. No pruritis ani. No pilonidal disease. No condyloma. No abscess/fistula. Normal sphincter tone. Tolerates digital and anoscopic rectal exam.  Performed: 05/17/2019 9:56 AM    Assessment & Plan Jessica Hector MD; 05/17/2019 9:59 AM)  [place] ANAL PAPILLA (K62.89) Impression: It is longer today on exam. It is not it is not prolapsing out. It is not ulcerated. There is no nodularity. It is not actively bleeding.  It is reasonable to remove it but I don't have a hard indication at this time. She is hesitant to do anything at this time. My suspicion of malignancy or precancerous changes is low at this time. She did not have any adenomatous polyps on the  colonoscopy. Could do outpatient surgery to remove it.  She would like to hold off on doing anything. She will think about things.  Current Plans Pt Education - [facility] Rectal Prep for Anorectal outpatient/office surgery: discussed with patient and provided information. Pt Education - [facility] Rectal Surgery HCI (Gross): discussed with patient and provided information.  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 However not actively bleeding. No major soiling or irritation with that at this time. Will not go away without surgery. It is proven already to be refractory to banding done last year many times.  She would like to hold off on any surgery as it is not bothering her since she is having soft daily bowel movements. She may reconsider this in the future.  The anatomy & physiology of the anorectal region was discussed. The pathophysiology of hemorrhoids and differential diagnosis was discussed. Natural history progression was discussed. I stressed the importance of a bowel regimen to have daily soft bowel movements to minimize progression of disease. Goal of one BM / day ideal. Use of wet wipes, warm baths, avoiding straining, etc were emphasized.  Educational handouts further explaining the pathology, treatment options, and bowel regimen were given as well. The patient expressed understanding.  Current Plans ANOSCOPY, DIAGNOSTIC (65035) Pt Education - [facility] Hemorrhoids (Gross): discussed with patient and provided information. You are being scheduled for surgery- Our schedulers will call you.  You should hear from our office's scheduling department within 5 working days about the location, date, and time of surgery. We try to make accommodations for patient's preferences in scheduling surgery, but sometimes the OR schedule or the surgeon's schedule prevents [place] from making those accommodations.  If you have  not heard from our office [phone]) in 5 working days, call the office and ask for your surgeon's nurse.  If you have other questions about your diagnosis, plan, or surgery, call the office and ask for your surgeon's nurse.  Pt Education - Pamphlet Given - The Hemorrhoid Book: discussed with patient and provided information. The anatomy & physiology of the anorectal region was discussed. The pathophysiology of hemorrhoids and differential diagnosis was discussed. Natural history risks without surgery was discussed. I stressed the importance of a bowel regimen to have daily soft bowel movements to minimize progression of disease.  Interventions such as sclerotherapy & banding were discussed.  The patient's symptoms are not adequately controlled by medicines and other non-operative treatments. I feel the risks & problems of no surgery outweigh the operative risks; therefore, I recommended surgery to treat the hemorrhoids by ligation, pexy, and possible resection.  Risks such as bleeding, infection, urinary difficulties, need for further treatment, heart attack, death, and other risks were discussed. I noted a good likelihood this will help address the problem. Goals of post-operative recovery were discussed as well. Possibility that this will not correct all symptoms was explained. Post-operative pain, bleeding, constipation, and other problems after surgery were discussed. We will work to minimize complications. Educational handouts further explaining the pathology, treatment options, and bowel regimen were given as well. Questions were answered. The patient expresses understanding & wishes to proceed with surgery.   ANAL FISSURE (K60.2) Impression: Scarring consistent with prior anal fissures. No active problems this time.  Again Strother recommended fiber bowel regimen to minimize stress to the area. If she does have a flare, consider trial diltiazem or surgical partial  sphincterotomy if that is refractory. Relaxed anal sphincter is against any urgent need at this time.  Current Plans Pt Education - [facility] Anal Fissure (Gross)  CORONARY VASOSPASM (I20.1) Impression: Episode of tachycardia and NSTEMI myocardial infarction March 2020 most likely triggered by epinephrine injection for a regional block for planned orthopedic surgery.  She has excellent exercise tolerance now but has not been totally compliant on her cardiac medications. I recommend she reconsider starting her Coreg backup as had been recommended 6 months ago. She is due for her 6 month follow-up with cardiology anyway. I'll ask Jessica. Acie Fredrickson and his group to double check there are no concerns prior to surgery.  Current Plans I recommended obtaining preoperative cardiac clearance. I am concerned about the health of the patient and the ability to tolerate the operation. Therefore, we will request clearance by cardiology to better assess operative risk & see if a reevaluation, further workup, etc is needed. Also recommendations on how medications such as for anticoagulation and blood pressure should be managed/held/restarted after surgery.  Jessica Hector, MD, FACS, MASCRS Gastrointestinal and Minimally Invasive Surgery  [facility] [address] [place], [place] [id number] [phone] Main / Paging [phone] Fax

## 2019-05-17 NOTE — H&P (Signed)
Liridona Mashaw Rynders Documented: 05/17/2019 9:25 AM Location: [facility] Patient #: [id number] DOB: 22-Feb-1966 Married / Language: English / Race: Black or African American Female  History of Present Illness [name] MD; 05/17/2019 10:05 AM) The patient is a 54 year old female who presents with a complaint of anal problems. Note for "Anal problems": ` ` ` Patient sent for surgical consultation at the request of Dr Havery Moros  Chief Complaint: Persistent hemorrhoid and anal canal polyp. ` ` The patient is a woman I met in 2017. Found to have pedunculated mass at anal crypt most likely consistent with anal canal polyp. Large external hemorrhoid. Surgical consultation offered. Asaro 2017. I noted it would require surgery to remove both issues. She was not feeling horribly symptomatic at this time and wish to hold off on any surgery. 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 Followed by cardiology. She was on angiotensin inhibitor and beta blocker. She's weaned herself off that and walks 5 miles a day. Last eye cardiology 6 months ago. Due for 6 month follow-up. She is otherwise well.  The challenges that she feels that hemorrhoid bothering her more. She feels the polyp popping out at times. Can be irritating. Difficult to keep the area clean. She is been compliant on MiraLAX and is moving her bowels more regularly now. At least every other day. She had a follow-up flexible sigmoidoscopy Feb 2020 given concerns by gastroenterology. Again the pedunculated anal crypt polyp and hemorrhoids were noted. Surgical consultation offered. She held off. However she's been feeling worse over the past year and wish to reconsider surgery.  (Review of systems as stated in this history (HPI) or in the review of systems. Otherwise all other 12 point ROS are  negative) ` ` `  This patient encounter took 40 minutes today to perform the following: obtain history, perform exam, review outside records, interpret tests & imaging, counsel the patient on their diagnosis; and, document this encounter, including findings & plan in the electronic health record (EHR).    PRIOR NOTE 2017: Patient sent for surgical consultation by Dr. Obion Cellar with [facility] gastroenterology. Concern of anal canal mass.  Pleasant but anxious woman. She comes today by herself. She's had a lot of anorectal problems. Tells me she has had anal fissures in the past. He is to have sharp pain with bowel movements. Not recently. Had severe chronic constipation. I also had problems with hemorrhoids. Has had a couple bandings done at [facility] gastroenterology early last year, Dr. Deatra Ina. Underwent colonoscopy by Dr Havery Moros. He found a mass in the anal canal. 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 She occasionally uses topical medication and warm soaks for hemorrhoid flares. Since she started on a MiraLAX bowel regimen, she is now moving her bowels about twice a day as opposed to once a week. That has made things much better. Denies rectal bleeding. No itching. No problems with soiling or drainage. She is rather embarrassed about being evaluated. Has been told that she has really bad hemorrhoids in the past.  She had a tubal ligation but no other abdominal surgeries. No personal nor family history of GI/colon cancer, inflammatory bowel disease, irritable bowel syndrome, allergy such as Celiac Sprue, dietary/dairy problems, colitis, ulcers nor gastritis. No recent sick contacts/gastroenteritis. No travel outside the country. No changes in diet. No dysphagia to solids or liquids. No significant heartburn or reflux. No hematochezia, hematemesis,  coffee ground emesis. No evidence of prior gastric/peptic ulceration.   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 Owens Shark, [place]; 05/17/2019 9:25 AM) Breast Biopsy Left. Colon Polyp Removal - Open Shoulder Surgery Left.  Diagnostic Studies History (Tanisha A. Owens Shark, [place]; 05/17/2019 9:25 AM) Colonoscopy 1-5 years ago within last year Mammogram within last year Pap Smear 1-5 years ago  Allergies (Tanisha A. Owens Shark, [place]; 05/17/2019 9:26 AM) No Known Drug Allergies [04/23/2015]: Allergies Reconciled  Medication History (Tanisha A. Owens Shark, [place]; 05/17/2019 9:26 AM) Multi-Vitamin (Oral) Active. Vitamin A & D (25000-1000UNIT Tablet, Oral daily) Active. Medications Reconciled  Social History (Tanisha A. Owens Shark, [place]; 05/17/2019 9:25 AM) Alcohol use Occasional alcohol use. Caffeine use Carbonated beverages, Tea. No drug use Tobacco use Never smoker.  Family History (Tanisha A. Owens Shark, [place]; 05/17/2019 9:25 AM) Alcohol Abuse Brother. Hypertension Brother, Father.  Pregnancy / Birth History (Tanisha A. Owens Shark, [place]; 05/17/2019 9:25 AM) Age at menarche 17 years. Age of menopause 8-55 Gravida 2 Irregular periods Maternal age 51-30 Para 2  Other Problems [name], MD; 05/17/2019 9:30 AM) Hemorrhoids Myocardial infarction     Review of Systems [name] MD; 05/17/2019 9:30 AM) General Present- Night Sweats. Not Present- Appetite Loss, Chills, Fatigue, Fever, Weight Gain and Weight Loss. Skin Not Present- Change in Wart/Mole, Dryness, Hives, Jaundice, New Lesions, Non-Healing Wounds, Rash and Ulcer. HEENT Not Present- Earache, Hearing Loss, Hoarseness, Nose Bleed, Oral Ulcers, Ringing in the Ears, Seasonal Allergies, Sinus Pain, Sore Throat, Visual Disturbances, Wears glasses/contact lenses and Yellow Eyes. Respiratory Not Present- Bloody sputum, Chronic Cough, Difficulty Breathing, Snoring and Wheezing. Breast Not  Present- Breast Mass, Breast Pain, Nipple Discharge and Skin Changes. Cardiovascular Not Present- Chest Pain, Difficulty Breathing Lying Down, Leg Cramps, Palpitations, Rapid Heart Rate, Shortness of Breath and Swelling of Extremities. Gastrointestinal Present- Constipation and Hemorrhoids. Not Present- Abdominal Pain, Bloating, Bloody Stool, Change in Bowel Habits, Chronic diarrhea, Difficulty Swallowing, Excessive gas, Gets full quickly at meals, Indigestion, Nausea, Rectal Pain and Vomiting. Female Genitourinary Not Present- Frequency, Nocturia, Painful Urination, Pelvic Pain and Urgency. Musculoskeletal Not Present- Back Pain, Joint Pain, Joint Stiffness, Muscle Pain, Muscle Weakness and Swelling of Extremities. Neurological Not Present- Decreased Memory, Fainting, Headaches, Numbness, Seizures, Tingling, Tremor, Trouble walking and Weakness. Psychiatric Not Present- Anxiety, Bipolar, Change in Sleep Pattern, Depression, Fearful and Frequent crying. Endocrine Present- Hair Changes and Hot flashes. Not Present- Cold Intolerance, Excessive Hunger, Heat Intolerance and New Diabetes. Hematology Not Present- Blood Thinners, Easy Bruising, Excessive bleeding, Gland problems, HIV and Persistent Infections.  Vitals (Tanisha A. Brown RMA; 05/17/2019 9:26 AM) 05/17/2019 9:26 AM Weight: 163.4 lb Height: 67in Body Surface Area: 1.86 m Body Mass Index: 25.59 kg/m  Temp.: 98.44F  Pulse: 102 (Regular)  BP: 128/84 (Sitting, Left Arm, Standard)        Physical Exam [name] MD; 05/17/2019 9:56 AM)  General Mental Status-Alert. General Appearance-Not in acute distress, Not Sickly. Orientation-Oriented X3. Hydration-Well hydrated. Voice-Normal.  Integumentary Global Assessment Upon inspection and palpation of skin surfaces of the - Axillae: non-tender, no inflammation or ulceration, no drainage. and Distribution of scalp and body hair is normal. General  Characteristics Temperature - normal warmth is noted.  Head and Neck Head-normocephalic, atraumatic with no lesions or palpable masses. Face Global Assessment - atraumatic, no absence of expression. Neck Global Assessment - no abnormal movements, no bruit auscultated  on the right, no bruit auscultated on the left, no decreased range of motion, non-tender. Trachea-midline. Thyroid Gland Characteristics - non-tender.  Eye Eyeball - Left-Extraocular movements intact, No Nystagmus - Left. Eyeball - Right-Extraocular movements intact, No Nystagmus - Right. Cornea - Left-No Hazy - Left. Cornea - Right-No Hazy - Right. Sclera/Conjunctiva - Left-No scleral icterus, No Discharge - Left. Sclera/Conjunctiva - Right-No scleral icterus, No Discharge - Right. Pupil - Left-Direct reaction to light normal. Pupil - Right-Direct reaction to light normal.  ENMT Ears Pinna - Left - no drainage observed, no generalized tenderness observed. Pinna - Right - no drainage observed, no generalized tenderness observed. Nose and Sinuses External Inspection of the Nose - no destructive lesion observed. Inspection of the nares - Left - quiet respiration. Inspection of the nares - Right - quiet respiration. Mouth and Throat Lips - Upper Lip - no fissures observed, no pallor noted. Lower Lip - no fissures observed, no pallor noted. Nasopharynx - no discharge present. Oral Cavity/Oropharynx - Tongue - no dryness observed. Oral Mucosa - no cyanosis observed. Hypopharynx - no evidence of airway distress observed.  Chest and Lung Exam Inspection Movements - Normal and Symmetrical. Accessory muscles - No use of accessory muscles in breathing. Palpation Palpation of the chest reveals - Non-tender. Auscultation Breath sounds - Normal and Clear.  Cardiovascular Auscultation Rhythm - Regular. Murmurs & Other Heart Sounds - Auscultation of the heart reveals - No Murmurs and No Systolic  Clicks.  Abdomen Inspection Inspection of the abdomen reveals - No Visible peristalsis and No Abnormal pulsations. Umbilicus - No Bleeding, No Urine drainage. Palpation/Percussion Palpation and Percussion of the abdomen reveal - Soft, Non Tender, No Rebound tenderness, No Rigidity (guarding) and No Cutaneous hyperesthesia. Note: Soft and flat. No umbilical hernia. No diastases. No guarding. No abdominal pain.  Female Genitourinary Sexual Maturity Tanner 5 - Adult hair pattern. Note: No vaginal bleeding nor discharge  Rectal Note: Posterior midline scarring some mild tunneling consistent with prior anal fissure. Mostly healed.   Large right anterior chronically prolapsed internal/external hemorrhoid. Anoscopy reveals a posterior midline anal canal pedunculated mass most likely consistent with hypertrophic anal papilla/anal polyp.  Exam done with assistance of female Medical Assistant in the room. Perianal skin clean with good hygiene. No pruritis ani. No pilonidal disease. No condyloma. No abscess/fistula. Normal sphincter tone. Tolerates digital and anoscopic rectal exam.  Peripheral Vascular Upper Extremity Inspection - Left - No Cyanotic nailbeds - Left, Not Ischemic. Inspection - Right - No Cyanotic nailbeds - Right, Not Ischemic.  Neurologic Neurologic evaluation reveals -normal attention span and ability to concentrate, able to name objects and repeat phrases. Appropriate fund of knowledge , normal sensation and normal coordination. Mental Status Affect - not angry, not paranoid. Cranial Nerves-Normal Bilaterally. Gait-Normal.  Neuropsychiatric Mental status exam performed with findings of-able to articulate well with normal speech/language, rate, volume and coherence, thought content normal with ability to perform basic computations and apply abstract reasoning and no evidence of hallucinations, delusions, obsessions or homicidal/suicidal  ideation. Note: Anxious but consolable  Musculoskeletal Global Assessment Spine, Ribs and Pelvis - no instability, subluxation or laxity. Right Upper Extremity - no instability, subluxation or laxity.  Lymphatic Head & Neck General Head & Neck Lymphatics: Bilateral - Description - No Localized lymphadenopathy. Axillary General Axillary Region: Bilateral - Description - No Localized lymphadenopathy. Femoral & Inguinal Generalized Femoral & Inguinal Lymphatics: Left - Description - No Localized lymphadenopathy. Right - Description - No Localized lymphadenopathy.   Results Remo Lipps C.  Gross MD; 05/17/2019 9:59 AM) Procedures  Name Value Date Hemorrhoids Procedure Anal exam: External Hemorrhoid Internal exam: Mass prolapse Other: Exam done with assistance of female Medical Assistant in the room............Marland KitchenLarge right anterior chronically prolapsed internal/external hemorrhoid. Posterior midline scarring some mild tunneling consistent with prior anal fissure. No significant anal stricture. Digital rectal exam & anoscopy reveals a posterior midline anal canal pedunculated mass most likely consistent with hypertrophic anal papilla/anal polyp............. ............ Perianal skin clean with good hygiene. No pruritis ani. No pilonidal disease. No condyloma. No abscess/fistula. Normal sphincter tone. Tolerates digital and anoscopic rectal exam.  Performed: 05/17/2019 9:56 AM    Assessment & Plan [name] MD; 05/17/2019 9:59 AM)  Natalia Leatherwood ANAL PAPILLA (K62.89) Impression: It is longer today on exam. It is not it is not prolapsing out. It is not ulcerated. There is no nodularity. It is not actively bleeding.  It is reasonable to remove it but I don't have a hard indication at this time. She is hesitant to do anything at this time. My suspicion of malignancy or precancerous changes is low at this time. She did not have any adenomatous polyps on the  colonoscopy. Could do outpatient surgery to remove it.  She would like to hold off on doing anything. She will think about things.  Current Plans Pt Education - [facility] Rectal Prep for Anorectal outpatient/office surgery: discussed with patient and provided information. Pt Education - [facility] Rectal Surgery HCI (Gross): discussed with patient and provided information.  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 However not actively bleeding. No major soiling or irritation with that at this time. Will not go away without surgery. It is proven already to be refractory to banding done last year many times.  She would like to hold off on any surgery as it is not bothering her since she is having soft daily bowel movements. She may reconsider this in the future.  The anatomy & physiology of the anorectal region was discussed. The pathophysiology of hemorrhoids and differential diagnosis was discussed. Natural history progression was discussed. I stressed the importance of a bowel regimen to have daily soft bowel movements to minimize progression of disease. Goal of one BM / day ideal. Use of wet wipes, warm baths, avoiding straining, etc were emphasized.  Educational handouts further explaining the pathology, treatment options, and bowel regimen were given as well. The patient expressed understanding.  Current Plans ANOSCOPY, DIAGNOSTIC (07622) Pt Education - [facility] Hemorrhoids (Gross): discussed with patient and provided information. You are being scheduled for surgery- Our schedulers will call you.  You should hear from our office's scheduling department within 5 working days about the location, date, and time of surgery. We try to make accommodations for patient's preferences in scheduling surgery, but sometimes the OR schedule or the surgeon's schedule prevents [place] from making those accommodations.  If you have  not heard from our office [phone]) in 5 working days, call the office and ask for your surgeon's nurse.  If you have other questions about your diagnosis, plan, or surgery, call the office and ask for your surgeon's nurse.  Pt Education - Pamphlet Given - The Hemorrhoid Book: discussed with patient and provided information. The anatomy & physiology of the anorectal region was discussed. The pathophysiology of hemorrhoids and differential diagnosis was discussed. Natural history risks without surgery was discussed. I stressed the importance of a bowel regimen to have daily soft bowel movements to minimize progression of disease.  Interventions such as sclerotherapy & banding were discussed.  The patient's symptoms are not adequately controlled by medicines and other non-operative treatments. I feel the risks & problems of no surgery outweigh the operative risks; therefore, I recommended surgery to treat the hemorrhoids by ligation, pexy, and possible resection.  Risks such as bleeding, infection, urinary difficulties, need for further treatment, heart attack, death, and other risks were discussed. I noted a good likelihood this will help address the problem. Goals of post-operative recovery were discussed as well. Possibility that this will not correct all symptoms was explained. Post-operative pain, bleeding, constipation, and other problems after surgery were discussed. We will work to minimize complications. Educational handouts further explaining the pathology, treatment options, and bowel regimen were given as well. Questions were answered. The patient expresses understanding & wishes to proceed with surgery.   ANAL FISSURE (K60.2) Impression: Scarring consistent with prior anal fissures. No active problems this time.  Again Strother recommended fiber bowel regimen to minimize stress to the area. If she does have a flare, consider trial diltiazem or surgical partial  sphincterotomy if that is refractory. Relaxed anal sphincter is against any urgent need at this time.  Current Plans Pt Education - [facility] Anal Fissure (Kerilyn Cortner)  CORONARY VASOSPASM (I20.1) Impression: Episode of tachycardia and NSTEMI myocardial infarction March 2020 most likely triggered by epinephrine injection for a regional block for planned orthopedic surgery.  She has excellent exercise tolerance now but has not been totally compliant on her cardiac medications. I recommend she reconsider starting her Coreg backup as had been recommended 6 months ago. She is due for her 6 month follow-up with cardiology anyway. I'll ask Dr. Acie Fredrickson and his group to double check there are no concerns prior to surgery.  Current Plans I recommended obtaining preoperative cardiac clearance. I am concerned about the health of the patient and the ability to tolerate the operation. Therefore, we will request clearance by cardiology to better assess operative risk & see if a reevaluation, further workup, etc is needed. Also recommendations on how medications such as for anticoagulation and blood pressure should be managed/held/restarted after surgery.  [name], MD, FACS, MASCRS Gastrointestinal and Minimally Invasive Surgery  [facility] [address] [place], [place] [address] [phone] Main / Paging [phone] Fax

## 2019-05-18 ENCOUNTER — Other Ambulatory Visit: Payer: Self-pay

## 2019-05-18 ENCOUNTER — Telehealth: Payer: Self-pay | Admitting: *Deleted

## 2019-05-18 MED ORDER — CARVEDILOL 3.125 MG PO TABS: 3.1250 mg | ORAL_TABLET | Freq: Two times a day (BID) | ORAL | 3 refills | Status: DC

## 2019-05-18 NOTE — Telephone Encounter (Signed)
   [facility] Pre-operative Risk Assessment    Request for surgical clearance:  1. What type of surgery is being performed? EXCISION OF ANAL CANAL MASS/REMOVAL OF HEMORRHOID   2. When is this surgery scheduled? TBD   3. What type of clearance is required (medical clearance vs. Pharmacy clearance to hold med vs. Both)? MEDICAL  4. Are there any medications that need to be held prior to surgery and how long? ASA   5. Practice name and name of physician performing surgery?  [facility]; DR. Remo Lipps Parks  6. What is your office phone number [phone]    7.   What is your office fax number [phone] ATTN: Jessica Parks, CMA  8.   Anesthesia type (None, local, MAC, general) ? GENERAL   Jessica Parks 05/18/2019, 12:08 PM  _________________________________________________________________   (provider comments below)

## 2019-05-18 NOTE — Telephone Encounter (Signed)
I s/w pt who is agreeable to appt for pre op clearance. Pt has been scheduled to see Robbie Lis, [facility] 05/23/19 @ 10:15. Pt also states she stopped taking her beta blocker and wants to know if she should resume. I assured the pt that I will send a message to Dr. Acie Fredrickson nurse to discuss with MD and our office will call back with recommendations. Pt thanked me for the call. I will send clearance note to [facility] for upcoming appt 05/23/19. Will send FYI to the surgeon. I will remove from the pre op call back pool.

## 2019-05-18 NOTE — Telephone Encounter (Signed)
   Primary Cardiologist:Philip Nahser, MD  Chart reviewed as part of pre-operative protocol coverage. There is a MyChart message from patient today noting some feelings of heart racing and requesting an appointment prior to upcoming procedure. Dr. Johnsie Cancel recommend office visit with him or APP in the next week or so.     Pre-op covering staff: - Please schedule appointment and call patient to inform them. - Please contact requesting surgeon's office via preferred method (i.e, phone, fax) to inform them of need for appointment prior to surgery.    Darreld Mclean, PA-C  05/18/2019, 12:22 PM

## 2019-05-20 NOTE — Progress Notes (Signed)
Cardiology Office Note    Date:  05/23/2019   ID:  Jessica Parks, DOB [birth year], MRN [id number]  PCP:  Glendale Chard, MD  Cardiologist:  Dr. Acie Fredrickson  Chief Complaint: Surgical clearance for EXCISION OF ANAL CANAL MASS/REMOVAL OF HEMORRHOID   History of Present Illness:   Jessica Parks is a 54 y.o. 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  She had an excess dose of epinephrine and Marcaine injected into her shoulder joint.  The excess epinephrine caused profound vasoconstriction including causing a stress-induced non-ST segment elevation myocardial infarction.  She was supported on the ventilator for several days.  Echocardiogram on June 12, 2018 revealed mildly depressed left ventricular systolic function with an ejection fraction of 40 to 45%.  Threre was severe hypokinesis in the basal and mid inferoseptal, anterior septal, basal anterior walls.  Did not tolerated low-dose Coreg and losartan due to soft blood pressure.  Last seen by Dr. Acie Fredrickson August 2020.  Restarted Coreg 3.125 mg twice daily.  Last echocardiogram September 2020 showed improved LV function to 55 to 123456, mild diastolic dysfunction.  Here today for surgical clearance.  Walking 3 to 5 miles every day.  Denies chest pain, shortness of breath, orthopnea, PND, syncope, palpitation, lower extremity edema or dizziness.  Blood pressure runs normal at home.  Has lot of anxiety at baseline.  She was placed on statin by PCP but had myalgia and discontinued.  LDL 97, HDL 56 on last check August 2020.  Encourage heart healthy diet and continued exercise.  Occasionally noted heart rate in the upper 80s while laying down.  This lasted for only a second or 2.  Seems due to extra beat but still did not felt it.  Past Medical History:  Diagnosis Date  . Anemia   . Anxiety disorder   . Arthritis   . Frozen shoulder    left- PT only 04/28/2018  . Hemorrhoids     Past Surgical History:  Procedure  Laterality Date  . BREAST BIOPSY Left 2014   benign  . SHOULDER ARTHROSCOPY WITH SUBACROMIAL DECOMPRESSION Left 06/11/2018   Procedure: LEFT SHOULDER ARTHROSCOPY WITH EXTENSIVE DEBRIDEMENT, LYSIS OF ADHESIONS, SUBACROMIAL DECOMPRESSION AND MANIPULATION UNDER ANESTHESIA;  Surgeon: Leandrew Koyanagi, MD;  Location: [facility];  Service: Orthopedics;  Laterality: Left;  . TUBAL LIGATION      Current Medications:  Prior to Admission medications   Medication Sig Start Date End Date Taking? Authorizing Provider  Ascorbic Acid (VITAMIN C ADULT GUMMIES PO) Take by mouth.   Yes [provider]  B-12, Methylcobalamin, 1000 MCG SUBL Place 5,000 mcg under the tongue. Take one dropper by mouth daily   Yes [provider]  carvedilol (COREG) 3.125 MG tablet Take 1 tablet (3.125 mg total) by mouth 2 (two) times daily with a meal. 05/18/19  Yes Nahser, Wonda Cheng, MD  Cholecalciferol (VITAMIN D3) 50 MCG (2000 UT) CHEW Chew 2,000 Units by mouth. Take two gummies daily   Yes [provider]  Multiple Vitamin (MULTIVITAMIN) tablet Take 1 tablet by mouth daily.   Yes [provider]  NON FORMULARY    Yes [provider]  aspirin 81 MG chewable tablet Chew 1 tablet (81 mg total) by mouth daily. Patient not taking: Reported on 12/06/2018 06/14/18   Aundra Dubin, PA-C   Allergies:   Epinephrine   Social History   Socioeconomic History  . Marital status: Married    Spouse name: Not on file  .  Number of children: 2  . Years of education: Not on file  . Highest education level: Not on file  Occupational History  . Occupation: unemployed    Fish farm manager: [employer]  Tobacco Use  . Smoking status: Never Smoker  . Smokeless tobacco: Never Used  Substance and Sexual Activity  . Alcohol use: Yes    Comment: socially  . Drug use: No  . Sexual activity: Yes    Partners: Male    Birth control/protection: Surgical  Other Topics Concern  . Not on file    Social History Narrative  . Not on file   Social Determinants of Health   Financial Resource Strain:   . Difficulty of Paying Living Expenses: Not on file  Food Insecurity:   . Worried About Charity fundraiser in the Last Year: Not on file  . Ran Out of Food in the Last Year: Not on file  Transportation Needs:   . Lack of Transportation (Medical): Not on file  . Lack of Transportation (Non-Medical): Not on file  Physical Activity:   . Days of Exercise per Week: Not on file  . Minutes of Exercise per Session: Not on file  Stress:   . Feeling of Stress : Not on file  Social Connections:   . Frequency of Communication with Friends and Family: Not on file  . Frequency of Social Gatherings with Friends and Family: Not on file  . Attends Religious Services: Not on file  . Active Member of Clubs or Organizations: Not on file  . Attends Archivist Meetings: Not on file  . Marital Status: Not on file     Family History:  The patient's family history includes Breast cancer in her maternal aunt, maternal aunt, and maternal grandmother; Diabetes in her maternal aunt; Hypertension in her father.   ROS:   Please see the history of present illness.    ROS All other systems reviewed and are negative.   PHYSICAL EXAM:   VS:  BP (!) 143/84   Pulse 73   Ht 5\' 7"  (1.702 m)   Wt 164 lb (74.4 kg)   BMI 25.69 kg/m    GEN: Well nourished, well developed, in no acute distress  HEENT: normal  Neck: no JVD, carotid bruits, or masses Cardiac: RRR; no murmurs, rubs, or gallops,no edema  Respiratory:  clear to auscultation bilaterally, normal work of breathing GI: soft, nontender, nondistended, + BS MS: no deformity or atrophy  Skin: warm and dry, no rash Neuro:  Alert and Oriented x 3, Strength and sensation are intact Psych: euthymic mood, full affect  Wt Readings from Last 3 Encounters:  05/23/19 164 lb (74.4 kg)  12/06/18 158 lb 9.6 oz (71.9 kg)  12/03/18 158 lb 6.4 oz (71.8  kg)      Studies/Labs Reviewed:   EKG:  EKG is ordered today.  The ekg ordered today demonstrates SR and biphasic T wave in leads V4-6.   Recent Labs: 06/14/2018: Magnesium 1.9 12/06/2018: ALT 9; BUN 10; Creatinine, Ser 0.68; Hemoglobin 11.8; Platelets 189; Potassium 4.0; Sodium 139; TSH 2.120   Lipid Panel    Component Value Date/Time   CHOL 176 12/06/2018 1550   TRIG 131 12/06/2018 1550   HDL 56 12/06/2018 1550   CHOLHDL 3.1 12/06/2018 1550   LDLCALC 97 12/06/2018 1550    Additional studies/ records that were reviewed today include:   Echocardiogram: 12/2018 1. The left ventricle has normal systolic function, with an ejection  fraction of  55-60%. The cavity size was normal. Left ventricular diastolic  Doppler parameters are consistent with impaired relaxation. No evidence of  left ventricular regional wall  motion abnormalities.  2. The right ventricle has normal systolic function. The cavity was  mildly enlarged. There is no increase in right ventricular wall thickness.  Right ventricular systolic pressure is normal.  3. Left atrial size was mildly dilated.  4. Trivial pericardial effusion is present.  5. The mitral valve is grossly normal. Mild thickening of the mitral  valve leaflet.  6. The tricuspid valve is grossly normal.  7. The aortic valve is tricuspid. No stenosis of the aortic valve.  8. The aorta is normal unless otherwise noted.  9. The aortic root and ascending aorta are normal in size and structure.  10. When compared to the prior study: Compared with 06/12/2018, the EF has  normalized to 55-60%.      ASSESSMENT & PLAN:    1.  Nonischemic cardiomyopathy -Last echocardiogram gram showed improved LV function to 55 to 60%.  No heart failure symptoms.  Continue beta-blocker.  2.  Elevated blood pressure reading -Repeat check 130/82.  At home blood pressure runs normally in the 120s over 80.  Likely initial high blood pressure reading due to  stress/whitecoat syndrome/anxiety.  3.  Preoperative clearance -She is easily getting greater than 4 METS of activity. Given past medical history and time since last visit, based on ACC/AHA guidelines, Jessica Parks would be at acceptable risk for the planned procedure without further cardiovascular testing.   I will route this recommendation to the requesting party via Epic fax function and remove from pre-op pool.  Please call with questions.   Medication Adjustments/Labs and Tests Ordered: Current medicines are reviewed at length with the patient today.  Concerns regarding medicines are outlined above.  Medication changes, Labs and Tests ordered today are listed in the Patient Instructions below. Patient Instructions  Medication Instructions:  Your physician recommends that you continue on your current medications as directed. Please refer to the Current Medication list given to you today.  *If you need a refill on your cardiac medications before your next appointment, please call your pharmacy*  Lab Work: None Ordered If you have labs (blood work) drawn today and your tests are completely normal, you will receive your results only by: Marland Kitchen MyChart Message (if you have MyChart) OR . A paper copy in the mail If you have any lab test that is abnormal or we need to change your treatment, we will call you to review the results.   Testing/Procedures: None Ordered   Follow-Up: At [facility], you and your health needs are our priority.  As part of our continuing mission to provide you with exceptional heart care, we have created designated Provider Care Teams.  These Care Teams include your primary Cardiologist (physician) and Advanced Practice Providers (APPs -  Physician Assistants and Nurse Practitioners) who all work together to provide you with the care you need, when you need it.  Your next appointment:   6 month(s)  The format for your next appointment:   Either In Person  or Virtual  Provider:   Mertie Moores, MD       Signed, Leanor Kail, [place]  05/23/2019 10:41 AM    [address], [place], [place]  [address] Phone: [phone]; Fax: [phone]

## 2019-05-23 ENCOUNTER — Other Ambulatory Visit: Payer: Self-pay

## 2019-05-23 ENCOUNTER — Encounter: Payer: Self-pay | Admitting: Physician Assistant

## 2019-05-23 ENCOUNTER — Ambulatory Visit: Payer: BC Managed Care – PPO | Admitting: Physician Assistant

## 2019-05-23 VITALS — BP 143/84 | HR 73 | Ht 67.0 in | Wt 164.0 lb

## 2019-05-23 DIAGNOSIS — I214 Non-ST elevation (NSTEMI) myocardial infarction: Secondary | ICD-10-CM | POA: Diagnosis not present

## 2019-05-23 DIAGNOSIS — Z01810 Encounter for preprocedural cardiovascular examination: Secondary | ICD-10-CM | POA: Diagnosis not present

## 2019-05-23 DIAGNOSIS — I428 Other cardiomyopathies: Secondary | ICD-10-CM

## 2019-05-23 DIAGNOSIS — R030 Elevated blood-pressure reading, without diagnosis of hypertension: Secondary | ICD-10-CM | POA: Diagnosis not present

## 2019-05-23 NOTE — Patient Instructions (Signed)
Medication Instructions:  Your physician recommends that you continue on your current medications as directed. Please refer to the Current Medication list given to you today.  *If you need a refill on your cardiac medications before your next appointment, please call your pharmacy*  Lab Work: None Ordered If you have labs (blood work) drawn today and your tests are completely normal, you will receive your results only by: Marland Kitchen MyChart Message (if you have MyChart) OR . A paper copy in the mail If you have any lab test that is abnormal or we need to change your treatment, we will call you to review the results.   Testing/Procedures: None Ordered   Follow-Up: At [facility], you and your health needs are our priority.  As part of our continuing mission to provide you with exceptional heart care, we have created designated Provider Care Teams.  These Care Teams include your primary Cardiologist (physician) and Advanced Practice Providers (APPs -  Physician Assistants and Nurse Practitioners) who all work together to provide you with the care you need, when you need it.  Your next appointment:   6 month(s)  The format for your next appointment:   Either In Person or Virtual  Provider:   Mertie Moores, MD

## 2019-07-25 ENCOUNTER — Other Ambulatory Visit: Payer: Self-pay

## 2019-07-25 ENCOUNTER — Encounter (HOSPITAL_BASED_OUTPATIENT_CLINIC_OR_DEPARTMENT_OTHER): Payer: Self-pay | Admitting: Surgery

## 2019-07-25 NOTE — Progress Notes (Signed)
Spoke with patient via telephone for pre op interview. NPO after MN. Patient to take Coreg AM of surgery with a sip of water. Arrival time 0530.

## 2019-07-26 ENCOUNTER — Other Ambulatory Visit (HOSPITAL_COMMUNITY)
Admission: RE | Admit: 2019-07-26 | Discharge: 2019-07-26 | Disposition: A | Discharge status: Home / Self Care | Payer: BC Managed Care – PPO | Source: Ambulatory Visit | Attending: Surgery | Admitting: Surgery

## 2019-07-26 DIAGNOSIS — Z20822 Contact with and (suspected) exposure to covid-19: Secondary | ICD-10-CM | POA: Diagnosis not present

## 2019-07-26 DIAGNOSIS — Z01812 Encounter for preprocedural laboratory examination: Secondary | ICD-10-CM | POA: Insufficient documentation

## 2019-07-26 LAB — SARS CORONAVIRUS 2 (TAT 6-24 HRS): SARS Coronavirus 2: NEGATIVE

## 2019-07-29 ENCOUNTER — Ambulatory Visit (HOSPITAL_BASED_OUTPATIENT_CLINIC_OR_DEPARTMENT_OTHER): Payer: BC Managed Care – PPO | Admitting: Certified Registered Nurse Anesthetist

## 2019-07-29 ENCOUNTER — Encounter (HOSPITAL_BASED_OUTPATIENT_CLINIC_OR_DEPARTMENT_OTHER): Payer: Self-pay | Admitting: Surgery

## 2019-07-29 ENCOUNTER — Encounter (HOSPITAL_BASED_OUTPATIENT_CLINIC_OR_DEPARTMENT_OTHER)
Admission: RE | Disposition: A | Discharge status: Home / Self Care | Payer: Self-pay | Source: Home / Self Care | Attending: Surgery

## 2019-07-29 ENCOUNTER — Ambulatory Visit (HOSPITAL_BASED_OUTPATIENT_CLINIC_OR_DEPARTMENT_OTHER)
Admission: RE | Admit: 2019-07-29 | Discharge: 2019-07-29 | Disposition: A | Discharge status: Home / Self Care | Payer: BC Managed Care – PPO | Attending: Surgery | Admitting: Surgery

## 2019-07-29 DIAGNOSIS — Z8249 Family history of ischemic heart disease and other diseases of the circulatory system: Secondary | ICD-10-CM | POA: Insufficient documentation

## 2019-07-29 DIAGNOSIS — K602 Anal fissure, unspecified: Secondary | ICD-10-CM | POA: Insufficient documentation

## 2019-07-29 DIAGNOSIS — K643 Fourth degree hemorrhoids: Secondary | ICD-10-CM | POA: Insufficient documentation

## 2019-07-29 DIAGNOSIS — K620 Anal polyp: Secondary | ICD-10-CM | POA: Diagnosis not present

## 2019-07-29 DIAGNOSIS — N926 Irregular menstruation, unspecified: Secondary | ICD-10-CM | POA: Insufficient documentation

## 2019-07-29 DIAGNOSIS — Z811 Family history of alcohol abuse and dependence: Secondary | ICD-10-CM | POA: Insufficient documentation

## 2019-07-29 DIAGNOSIS — K644 Residual hemorrhoidal skin tags: Secondary | ICD-10-CM | POA: Diagnosis not present

## 2019-07-29 DIAGNOSIS — Z86010 Personal history of colonic polyps: Secondary | ICD-10-CM | POA: Insufficient documentation

## 2019-07-29 DIAGNOSIS — K603 Anal fistula: Secondary | ICD-10-CM | POA: Diagnosis not present

## 2019-07-29 DIAGNOSIS — I252 Old myocardial infarction: Secondary | ICD-10-CM | POA: Diagnosis not present

## 2019-07-29 HISTORY — PX: HEMORRHOID SURGERY: SHX153

## 2019-07-29 HISTORY — PX: RECTAL EXAM UNDER ANESTHESIA: SHX6399

## 2019-07-29 HISTORY — DX: Atherosclerotic heart disease of native coronary artery without angina pectoris: I25.10

## 2019-07-29 HISTORY — PX: MASS EXCISION: SHX2000

## 2019-07-29 SURGERY — EXAM UNDER ANESTHESIA, RECTUM
Anesthesia: General | Site: Rectum

## 2019-07-29 MED ORDER — ONDANSETRON HCL 4 MG/2ML IJ SOLN
INTRAMUSCULAR | Status: AC
  Filled 2019-07-29: qty 2

## 2019-07-29 MED ORDER — DIBUCAINE (PERIANAL) 1 % EX OINT
TOPICAL_OINTMENT | CUTANEOUS | Status: DC | PRN
  Administered 2019-07-29: 1 via RECTAL

## 2019-07-29 MED ORDER — LIDOCAINE 2% (20 MG/ML) 5 ML SYRINGE
INTRAMUSCULAR | Status: DC | PRN
  Administered 2019-07-29: 60 mg via INTRAVENOUS

## 2019-07-29 MED ORDER — ACETAMINOPHEN 500 MG PO TABS
1000.0000 mg | ORAL_TABLET | ORAL | Status: AC
  Administered 2019-07-29: 06:00:00 1000 mg via ORAL

## 2019-07-29 MED ORDER — CEFTRIAXONE SODIUM 2 G IJ SOLR
INTRAMUSCULAR | Status: AC
  Filled 2019-07-29: qty 20

## 2019-07-29 MED ORDER — BUPIVACAINE LIPOSOME 1.3 % IJ SUSP: 20.0000 mL | Freq: Once | INTRAMUSCULAR | Status: DC

## 2019-07-29 MED ORDER — METRONIDAZOLE IN NACL 5-0.79 MG/ML-% IV SOLN
INTRAVENOUS | Status: AC
  Filled 2019-07-29: qty 100

## 2019-07-29 MED ORDER — SODIUM CHLORIDE 0.9 % IV SOLN
INTRAVENOUS | Status: AC
  Filled 2019-07-29: qty 100

## 2019-07-29 MED ORDER — LIDOCAINE 2% (20 MG/ML) 5 ML SYRINGE
INTRAMUSCULAR | Status: AC
  Filled 2019-07-29: qty 5

## 2019-07-29 MED ORDER — OXYCODONE HCL 5 MG PO TABS: 5.0000 mg | ORAL_TABLET | ORAL | Status: DC | PRN

## 2019-07-29 MED ORDER — CHLORHEXIDINE GLUCONATE CLOTH 2 % EX PADS: 6.0000 | MEDICATED_PAD | Freq: Once | CUTANEOUS | Status: DC

## 2019-07-29 MED ORDER — FENTANYL CITRATE (PF) 100 MCG/2ML IJ SOLN
INTRAMUSCULAR | Status: AC
  Filled 2019-07-29: qty 2

## 2019-07-29 MED ORDER — OXYCODONE HCL 5 MG PO TABS
5.0000 mg | ORAL_TABLET | Freq: Once | ORAL | Status: AC | PRN
  Administered 2019-07-29: 5 mg via ORAL

## 2019-07-29 MED ORDER — MIDAZOLAM HCL 2 MG/2ML IJ SOLN
INTRAMUSCULAR | Status: DC | PRN
  Administered 2019-07-29: 2 mg via INTRAVENOUS

## 2019-07-29 MED ORDER — FENTANYL CITRATE (PF) 100 MCG/2ML IJ SOLN
INTRAMUSCULAR | Status: DC | PRN
  Administered 2019-07-29: 50 ug via INTRAVENOUS

## 2019-07-29 MED ORDER — OXYCODONE HCL 5 MG/5ML PO SOLN: 5.0000 mg | Freq: Once | ORAL | Status: AC | PRN

## 2019-07-29 MED ORDER — GLYCOPYRROLATE PF 0.2 MG/ML IJ SOSY
PREFILLED_SYRINGE | INTRAMUSCULAR | Status: AC
  Filled 2019-07-29: qty 1

## 2019-07-29 MED ORDER — BUPIVACAINE LIPOSOME 1.3 % IJ SUSP
INTRAMUSCULAR | Status: DC | PRN
  Administered 2019-07-29: 20 mL

## 2019-07-29 MED ORDER — GLYCOPYRROLATE PF 0.2 MG/ML IJ SOSY
PREFILLED_SYRINGE | INTRAMUSCULAR | Status: DC | PRN
  Administered 2019-07-29: .2 mg via INTRAVENOUS

## 2019-07-29 MED ORDER — KETOROLAC TROMETHAMINE 30 MG/ML IJ SOLN
INTRAMUSCULAR | Status: AC
  Filled 2019-07-29: qty 1

## 2019-07-29 MED ORDER — MIDAZOLAM HCL 2 MG/2ML IJ SOLN
INTRAMUSCULAR | Status: AC
  Filled 2019-07-29: qty 2

## 2019-07-29 MED ORDER — BUPIVACAINE HCL (PF) 0.5 % IJ SOLN
INTRAMUSCULAR | Status: DC | PRN
  Administered 2019-07-29: 20 mL

## 2019-07-29 MED ORDER — GABAPENTIN 300 MG PO CAPS
ORAL_CAPSULE | ORAL | Status: AC
  Filled 2019-07-29: qty 1

## 2019-07-29 MED ORDER — OXYCODONE HCL 5 MG PO TABS: 5.0000 mg | ORAL_TABLET | Freq: Four times a day (QID) | ORAL | 0 refills | Status: DC | PRN

## 2019-07-29 MED ORDER — SODIUM CHLORIDE 0.9 % IV SOLN
INTRAVENOUS | Status: DC
  Administered 2019-07-29: 09:00:00 1000 mL via INTRAVENOUS

## 2019-07-29 MED ORDER — EPHEDRINE 5 MG/ML INJ
INTRAVENOUS | Status: AC
  Filled 2019-07-29: qty 10

## 2019-07-29 MED ORDER — ACETAMINOPHEN 500 MG PO TABS
ORAL_TABLET | ORAL | Status: AC
  Filled 2019-07-29: qty 2

## 2019-07-29 MED ORDER — DEXAMETHASONE SODIUM PHOSPHATE 10 MG/ML IJ SOLN
INTRAMUSCULAR | Status: AC
  Filled 2019-07-29: qty 1

## 2019-07-29 MED ORDER — PHENYLEPHRINE 40 MCG/ML (10ML) SYRINGE FOR IV PUSH (FOR BLOOD PRESSURE SUPPORT)
PREFILLED_SYRINGE | INTRAVENOUS | Status: AC
  Filled 2019-07-29: qty 10

## 2019-07-29 MED ORDER — GABAPENTIN 300 MG PO CAPS
300.0000 mg | ORAL_CAPSULE | ORAL | Status: AC
  Administered 2019-07-29: 06:00:00 300 mg via ORAL

## 2019-07-29 MED ORDER — PROPOFOL 500 MG/50ML IV EMUL
INTRAVENOUS | Status: DC | PRN
  Administered 2019-07-29: 150 mg via INTRAVENOUS
  Administered 2019-07-29: 50 mg via INTRAVENOUS

## 2019-07-29 MED ORDER — METRONIDAZOLE IN NACL 5-0.79 MG/ML-% IV SOLN
500.0000 mg | INTRAVENOUS | Status: AC
  Administered 2019-07-29: 500 mg via INTRAVENOUS

## 2019-07-29 MED ORDER — DEXAMETHASONE SODIUM PHOSPHATE 10 MG/ML IJ SOLN
INTRAMUSCULAR | Status: DC | PRN
  Administered 2019-07-29: 10 mg via INTRAVENOUS

## 2019-07-29 MED ORDER — LACTATED RINGERS IV SOLN: INTRAVENOUS | Status: DC | PRN

## 2019-07-29 MED ORDER — ONDANSETRON HCL 4 MG/2ML IJ SOLN
INTRAMUSCULAR | Status: DC | PRN
  Administered 2019-07-29: 4 mg via INTRAVENOUS

## 2019-07-29 MED ORDER — PROPOFOL 10 MG/ML IV BOLUS
INTRAVENOUS | Status: AC
  Filled 2019-07-29: qty 20

## 2019-07-29 MED ORDER — KETOROLAC TROMETHAMINE 30 MG/ML IJ SOLN
30.0000 mg | Freq: Once | INTRAMUSCULAR | Status: AC | PRN
  Administered 2019-07-29: 30 mg via INTRAVENOUS

## 2019-07-29 MED ORDER — FENTANYL CITRATE (PF) 100 MCG/2ML IJ SOLN
25.0000 ug | INTRAMUSCULAR | Status: DC | PRN
  Administered 2019-07-29: 09:00:00 25 ug via INTRAVENOUS

## 2019-07-29 MED ORDER — OXYCODONE HCL 5 MG PO TABS
ORAL_TABLET | ORAL | Status: AC
  Filled 2019-07-29: qty 1

## 2019-07-29 MED ORDER — SODIUM CHLORIDE 0.9 % IV SOLN
2.0000 g | INTRAVENOUS | Status: AC
  Administered 2019-07-29: 2 g via INTRAVENOUS

## 2019-07-29 SURGICAL SUPPLY — 67 items
APL SKNCLS STERI-STRIP NONHPOA (GAUZE/BANDAGES/DRESSINGS)
BENZOIN TINCTURE PRP APPL 2/3 (GAUZE/BANDAGES/DRESSINGS) ×1 IMPLANT
BLADE CLIPPER SENSICLIP SURGIC (BLADE) IMPLANT
BLADE HEX COATED 2.75 (ELECTRODE) ×3 IMPLANT
BLADE SURG 10 STRL SS (BLADE) IMPLANT
BLADE SURG 15 STRL LF DISP TIS (BLADE) ×1 IMPLANT
BLADE SURG 15 STRL SS (BLADE) ×3
BRIEF STRETCH FOR OB PAD LRG (UNDERPADS AND DIAPERS) ×3 IMPLANT
CANISTER SUCT 1200ML W/VALVE (MISCELLANEOUS) ×3 IMPLANT
COVER BACK TABLE 60X90IN (DRAPES) ×3 IMPLANT
COVER MAYO STAND STRL (DRAPES) ×3 IMPLANT
COVER WAND RF STERILE (DRAPES) ×3 IMPLANT
DECANTER SPIKE VIAL GLASS SM (MISCELLANEOUS) ×1 IMPLANT
DRAPE HYSTEROSCOPY (DRAPE) ×3 IMPLANT
DRAPE LAPAROTOMY 100X72 PEDS (DRAPES) ×3 IMPLANT
DRAPE SHEET LG 3/4 BI-LAMINATE (DRAPES) IMPLANT
DRSG PAD ABDOMINAL 8X10 ST (GAUZE/BANDAGES/DRESSINGS) ×4 IMPLANT
ELECT NDL TIP 2.8 STRL (NEEDLE) IMPLANT
ELECT NEEDLE TIP 2.8 STRL (NEEDLE) IMPLANT
ELECT REM PT RETURN 9FT ADLT (ELECTROSURGICAL) ×3
ELECTRODE REM PT RTRN 9FT ADLT (ELECTROSURGICAL) ×1 IMPLANT
FILTER STRAW (MISCELLANEOUS) ×1 IMPLANT
GAUZE SPONGE 4X4 12PLY STRL (GAUZE/BANDAGES/DRESSINGS) IMPLANT
GAUZE SPONGE 4X4 12PLY STRL LF (GAUZE/BANDAGES/DRESSINGS) ×2 IMPLANT
GLOVE ECLIPSE 8.0 STRL XLNG CF (GLOVE) ×3 IMPLANT
GLOVE INDICATOR 8.0 STRL GRN (GLOVE) ×3 IMPLANT
GOWN STRL REUS W/TWL XL LVL3 (GOWN DISPOSABLE) ×3 IMPLANT
IV CATH 14GX2 1/4 (CATHETERS) IMPLANT
IV CATH PLACEMENT 20 GA (IV SOLUTION) ×1 IMPLANT
KIT SIGMOIDOSCOPE (SET/KITS/TRAYS/PACK) IMPLANT
KIT TURNOVER CYSTO (KITS) ×3 IMPLANT
LEGGING LITHOTOMY PAIR STRL (DRAPES) IMPLANT
LOOP VESSEL MAXI BLUE (MISCELLANEOUS) IMPLANT
NEEDLE HYPO 22GX1.5 SAFETY (NEEDLE) ×3 IMPLANT
NS IRRIG 500ML POUR BTL (IV SOLUTION) ×3 IMPLANT
PAD PREP 24X48 CUFFED NSTRL (MISCELLANEOUS) ×3 IMPLANT
PENCIL BUTTON HOLSTER BLD 10FT (ELECTRODE) ×3 IMPLANT
SCRUB TECHNI CARE 4 OZ NO DYE (MISCELLANEOUS) ×3 IMPLANT
SET BASIN DAY SURGERY F.S. (CUSTOM PROCEDURE TRAY) ×3 IMPLANT
SHEARS HARMONIC 9CM CVD (BLADE) IMPLANT
SURGILUBE 2OZ TUBE FLIPTOP (MISCELLANEOUS) ×3 IMPLANT
SUT CHROMIC 2 0 SH (SUTURE) ×3 IMPLANT
SUT CHROMIC 3 0 SH 27 (SUTURE) ×3 IMPLANT
SUT ETHIBOND 0 (SUTURE) IMPLANT
SUT MNCRL AB 4-0 PS2 18 (SUTURE) IMPLANT
SUT PROLENE 2 0 SH DA (SUTURE) IMPLANT
SUT VIC AB 2-0 SH 27 (SUTURE)
SUT VIC AB 2-0 SH 27XBRD (SUTURE) IMPLANT
SUT VIC AB 2-0 UR6 27 (SUTURE) ×13 IMPLANT
SUT VIC AB 3-0 SH 18 (SUTURE) IMPLANT
SUT VICRYL 0 UR6 27IN ABS (SUTURE) IMPLANT
SUT VICRYL AB 2 0 TIE (SUTURE) IMPLANT
SUT VICRYL AB 2 0 TIES (SUTURE)
SWAB COLLECTION DEVICE MRSA (MISCELLANEOUS) IMPLANT
SWAB CULTURE ESWAB REG 1ML (MISCELLANEOUS) IMPLANT
SYR 20ML LL LF (SYRINGE) ×3 IMPLANT
SYR 27GX1/2 1ML LL SAFETY (SYRINGE) ×3 IMPLANT
SYR BULB IRRIGATION 50ML (SYRINGE) ×3 IMPLANT
SYR CONTROL 10ML LL (SYRINGE) ×2 IMPLANT
TAPE CLOTH 3X10 TAN LF (GAUZE/BANDAGES/DRESSINGS) ×1 IMPLANT
TOWEL OR 17X26 10 PK STRL BLUE (TOWEL DISPOSABLE) ×4 IMPLANT
TRAY DSU PREP LF (CUSTOM PROCEDURE TRAY) ×3 IMPLANT
TUBE CONNECTING 12'X1/4 (SUCTIONS) ×1
TUBE CONNECTING 12X1/4 (SUCTIONS) ×2 IMPLANT
UNDERPAD 30X30 (UNDERPADS AND DIAPERS) ×1 IMPLANT
WATER STERILE IRR 500ML POUR (IV SOLUTION) ×1 IMPLANT
YANKAUER SUCT BULB TIP NO VENT (SUCTIONS) ×4 IMPLANT

## 2019-07-29 NOTE — Discharge Instructions (Signed)
ANORECTAL SURGERY:  POST OPERATIVE INSTRUCTIONS  ######################################################################  EAT Start with a pureed / full liquid diet After 24 hours, gradually transition to a high fiber diet.    CONTROL PAIN Control pain so you can tolerate bowel movements,  walk, sleep, tolerate sneezing/coughing, and go up/down stairs.   HAVE A BOWEL MOVEMENT DAILY Keep your bowels regular to avoid problems.   Taking a fiber supplement every day to keep bowels soft.   Try a laxative to override constipation. Use an antidairrheal to slow down diarrhea.   Call if not better after 2 tries  WALK Walk an hour a day.  Control your pain to do that.   CALL IF YOU HAVE PROBLEMS/CONCERNS Call if you are still struggling despite following these instructions. Call if you have concerns not answered by these instructions  ######################################################################    1. Take your usually prescribed home medications unless otherwise directed.  2. DIET: Follow a light bland diet & liquids the first 24 hours after arrival home, such as soup, liquids, starches, etc.  Be sure to drink plenty of fluids.  Quickly advance to a usual solid diet within a few days.  Avoid fast food or heavy meals as your are more likely to get nauseated or have irregular bowels.  A low-fat, high-fiber diet for the rest of your life is ideal.  3. PAIN CONTROL: a. Pain is best controlled by a usual combination of three different methods TOGETHER: i. Ice/Heat ii. Over the counter pain medication iii. Prescription pain medication b. Expect swelling and discomfort in the anus/rectal area.  Warm water baths (30-60 minutes up to 6 times a day, especially after bowel meovements) will help. Use ice for the first few days to help decrease swelling and bruising, then switch to heat such as warm towels, sitz baths, warm baths, etc to help relax tight/sore spots and speed recovery.   Some people prefer to use ice alone, heat alone, alternating between ice & heat.  Experiment to what works for you.   c. It is helpful to take an over-the-counter pain medication continuously for the first few weeks.  Choose one of the following that works best for you: i. Naproxen (Aleve, etc)  Two 250m tabs twice a day ii. Ibuprofen (Advil, etc) Three 2072mtabs four times a day (every meal & bedtime) iii. Acetaminophen (Tylenol, etc) 500-65044mour times a day (every meal & bedtime) d. A  prescription for pain medication (such as oxycodone, hydrocodone, etc) should be given to you upon discharge.  Take your pain medication as prescribed.  i. If you are having problems/concerns with the prescription medicine (does not control pain, nausea, vomiting, rash, itching, etc), please call us [phone] see if we need to switch you to a different pain medicine that will work better for you and/or control your side effect better. ii. If you need a refill on your pain medication, please contact your pharmacy.  They will contact our office to request authorization. Prescriptions will not be filled after 5 pm or on week-ends.  If can take up to 48 hours for it to be filled & ready so avoid waiting until you are down to thel ast pill. e. A topical cream (Dibucaine) or a prescription for a cream (such as diltiazem 2% gel) may be given to you.  Many people find relief with topical creams.  Some people find it burns too much.  Experiment.  If it helps, use it.  If it burns, don't using  it.  Use a Sitz Bath 4-8 times a day for relief   [employer] A sitz bath is a warm water bath taken in the sitting position that covers only the hips and buttocks. It may be used for either healing or hygiene purposes. Sitz baths are also used to relieve pain, itching, or muscle spasms. The water may contain medicine. Moist heat will help you heal and relax.  HOME CARE INSTRUCTIONS  Take 3 to 4 sitz baths a day. 1. Fill the  bathtub half full with warm water. 2. Sit in the water and open the drain a little. 3. Turn on the warm water to keep the tub half full. Keep the water running constantly. 4. Soak in the water for 15 to 20 minutes. 5. After the sitz bath, pat the affected area dry first.   4. KEEP YOUR BOWELS REGULAR a. The goal is one soft bowel movement a day b. Avoid getting constipated.  Between the surgery and the pain medications, it is common to experience some constipation.  Increasing fluid intake and taking a fiber supplement (such as Metamucil, Citrucel, FiberCon, MiraLax, etc) 2-3 times a day regularly will usually help prevent this problem from occurring.  A mild laxative (prune juice, Milk of Magnesia, MiraLax, etc) should be taken according to package directions if there are no bowel movements after 48 hours. c. Watch out for diarrhea.  If you have many loose bowel movements, simplify your diet to bland foods & liquids for a few days.  Stop any stool softeners and decrease your fiber supplement.  Switching to mild anti-diarrheal medications (Kayopectate, Pepto Bismol) can help.  Can try an imodium/loperamide dose.  If this worsens or does not improve, please call us.  5. Wound Care  a. Remove your bandages with your first bowel movement, usually the day after surgery.  You may have packing if you had an abscess.  Let any packing or gauze fall come out.   b. Wear an absorbent pad or soft cotton balls in your underwear as needed to catch any drainage and help keep the area  c. Keep the area clean and dry.  Bathe / shower every day.  Keep the area clean by showering / bathing over the incision / wound.   It is okay to soak an open wound to help wash it.  Consider using a squeeze bottle filled with warm water to gently wash the anal area.  Wet wipes or showers / gentle washing after bowel movements is often less traumatic than regular toilet paper. d. Dennis Bast will often notice bleeding with bowel movements.   This should slow down by the end of the first week of surgery.  Sitting on an ice pack can help. e. Expect some drainage.  This should slow down by the end of the first week of surgery, but you will have occasional bleeding or drainage up to a few months after surgery.  Wear an absorbent pad or soft cotton gauze in your underwear until the drainage stops.  6. ACTIVITIES as tolerated:   a. You may resume regular (light) daily activities beginning the next day--such as daily self-care, walking, climbing stairs--gradually increasing activities as tolerated.  If you can walk 30 minutes without difficulty, it is safe to try more intense activity such as jogging, treadmill, bicycling, low-impact aerobics, swimming, etc. b. Save the most intensive and strenuous activity for last such as sit-ups, heavy lifting, contact sports, etc  Refrain from any heavy lifting or straining  until you are off narcotics for pain control.   c. DO NOT PUSH THROUGH PAIN.  Let pain be your guide: If it hurts to do something, don't do it.  Pain is your body warning you to avoid that activity for another week until the pain goes down. d. You may drive when you are no longer taking prescription pain medication, you can comfortably sit for long periods of time, and you can safely maneuver your car and apply brakes. e. Dennis Bast may have sexual intercourse when it is comfortable.  7. FOLLOW UP in our office a. Please call [facility] at [phone] to set up an appointment to see your surgeon in the office for a follow-up appointment approximately 2-3 weeks after your surgery. b. Make sure that you call for this appointment the day you arrive home to ensure a convenient appointment time.  8. IF YOU HAVE DISABILITY OR FAMILY LEAVE FORMS, BRING THEM TO THE OFFICE FOR PROCESSING.  DO NOT GIVE THEM TO YOUR DOCTOR.        WHEN TO CALL us [phone]: 1. Poor pain control 2. Reactions / problems with new medications (rash/itching, nausea,  etc)  3. Fever over 101.5 F (38.5 C) 4. Inability to urinate 5. Nausea and/or vomiting 6. Worsening swelling or bruising 7. Continued bleeding from incision. 8. Increased pain, redness, or drainage from the incision  The clinic staff is available to answer your questions during regular business hours (8:30am-5pm).  Please don't hesitate to call and ask to speak to one of our nurses for clinical concerns.   A surgeon from [facility] is always on call at the hospitals   If you have a medical emergency, go to the nearest emergency room or call 911.    [facility], [place], [place], [place], [place]  [address] ? MAIN: [phone] ? TOLL FREE: [phone] ? FAX [phone] [email or web address]    Post Anesthesia Home Care Instructions  Activity: Get plenty of rest for the remainder of the day. A responsible individual must stay with you for 24 hours following the procedure.  For the next 24 hours, DO NOT: -Drive a car -Paediatric nurse -Drink alcoholic beverages -Take any medication unless instructed by your physician -Make any legal decisions or sign important papers.  Meals: Start with liquid foods such as gelatin or soup. Progress to regular foods as tolerated. Avoid greasy, spicy, heavy foods. If nausea and/or vomiting occur, drink only clear liquids until the nausea and/or vomiting subsides. Call your physician if vomiting continues.  Special Instructions/Symptoms: Your throat may feel dry or sore from the anesthesia or the breathing tube placed in your throat during surgery. If this causes discomfort, gargle with warm salt water. The discomfort should disappear within 24 hours.  If you had a scopolamine patch placed behind your ear for the management of post- operative nausea and/or vomiting:  1. The medication in the patch is effective for 72 hours, after which it should be removed.  Wrap patch in a tissue and  discard in the trash. Wash hands thoroughly with soap and water. 2. You may remove the patch earlier than 72 hours if you experience unpleasant side effects which may include dry mouth, dizziness or visual disturbances. 3. Avoid touching the patch. Wash your hands with soap and water after contact with the patch.     Do not take any Tylenol until after 12:00 pm today. Do not take any nonsteroidal anti inflammatories until after  3:00 pm today.

## 2019-07-29 NOTE — H&P (Signed)
MIRNA SUTCLIFFE   DOB: 1965/11/25  Married / Language: English / Race: Black or African American   Patient Care Team: Glendale Chard, MD as PCP - General (Internal Medicine) Nahser, Wonda Cheng, MD as PCP - Cardiology (Cardiology) Michael Boston, MD as Consulting Physician (General Surgery) Armbruster, Carlota Raspberry, MD as Consulting Physician (Gastroenterology) Leandrew Koyanagi, MD as Attending Physician (Orthopedic Surgery) Michael Boston, MD as Consulting Physician (General Surgery) `  `   Patient sent for surgical consultation at the request of Dr Havery Moros   Chief Complaint: Persistent hemorrhoid and anal canal polyp.  `  `  The patient is a woman I met in 2017. Found to have pedunculated mass at anal crypt most likely consistent with anal canal polyp. Large external hemorrhoid. Surgical consultation offered. Asaro 2017. I noted it would require surgery to remove both issues. She was not feeling horribly symptomatic at this time and wish to hold off on any surgery. 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 Followed by cardiology. She was on angiotensin inhibitor and beta blocker. She's weaned herself off that and walks 5 miles a day. Last eye cardiology 6 months ago. Due for 6 month follow-up. She is otherwise well.  The challenges that she feels that hemorrhoid bothering her more. She feels the polyp popping out at times. Can be irritating. Difficult to keep the area clean. She is been compliant on MiraLAX and is moving her bowels more regularly now. At least every other day. She had a follow-up flexible sigmoidoscopy Feb 2020 given concerns by gastroenterology. Again the pedunculated anal crypt polyp and hemorrhoids were noted. Surgical consultation offered. She held off. However she's been feeling worse over the past year and wish to reconsider surgery.  (Review of systems as  stated in this history (HPI) or in the review of systems. Otherwise all other 12 point ROS are negative)  `  `  `  This patient encounter took 40 minutes today to perform the following: obtain history, perform exam, review outside records, interpret tests & imaging, counsel the patient on their diagnosis; and, document this encounter, including findings & plan in the electronic health record (EHR).  PRIOR NOTE 2017:  Patient sent for surgical consultation by Dr. Greentop Cellar with [facility] gastroenterology. Concern of anal canal mass.  Pleasant but anxious woman. She comes today by herself. She's had a lot of anorectal problems. Tells me she has had anal fissures in the past. He is to have sharp pain with bowel movements. Not recently. Had severe chronic constipation. I also had problems with hemorrhoids. Has had a couple bandings done at [facility] gastroenterology early last year, Dr. Deatra Ina. Underwent colonoscopy by Dr Havery Moros. He found a mass in the anal canal. 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 She occasionally uses topical medication and warm soaks for hemorrhoid flares. Since she started on a MiraLAX bowel regimen, she is now moving her bowels about twice a day as opposed to once a week. That has made things much better. Denies rectal bleeding. No itching. No problems with soiling or drainage. She is rather embarrassed about being evaluated. Has been told that she has really bad hemorrhoids in the past.  She had a tubal ligation but no other abdominal surgeries. No personal nor family history of GI/colon cancer, inflammatory bowel disease, irritable bowel syndrome, allergy such as Celiac Sprue, dietary/dairy problems, colitis, ulcers nor gastritis. No recent sick contacts/gastroenteritis.  No travel outside the country. No changes in diet. No dysphagia to solids or liquids. No significant heartburn or reflux. No hematochezia, hematemesis, coffee ground emesis. No evidence of prior gastric/peptic  ulceration.  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 Owens Shark, [place]; 05/17/2019 9:25 AM)  Breast Biopsy Left.  Colon Polyp Removal - Open  Shoulder Surgery Left.  Diagnostic Studies History (Tanisha A. Owens Shark, [place]; 05/17/2019 9:25 AM)  Colonoscopy 1-5 years ago  within last year  Mammogram within last year  Pap Smear 1-5 years ago  Allergies (Tanisha A. Owens Shark, [place]; 05/17/2019 9:26 AM)  No Known Drug Allergies [04/23/2015]:  Allergies Reconciled  Medication History (Tanisha A. Owens Shark, [place]; 05/17/2019 9:26 AM)  Multi-Vitamin (Oral) Active.  Vitamin A & D (25000-1000UNIT Tablet, Oral daily) Active.  Medications Reconciled  Social History (Tanisha A. Owens Shark, [place]; 05/17/2019 9:25 AM)  Alcohol use Occasional alcohol use.  Caffeine use Carbonated beverages, Tea.  No drug use  Tobacco use Never smoker.  Family History (Tanisha A. Owens Shark, [place]; 05/17/2019 9:25 AM)  Alcohol Abuse Brother.  Hypertension Brother, Father.  Pregnancy / Birth History (Tanisha A. Owens Shark, [place]; 05/17/2019 9:25 AM)  Age at menarche 67 years.  Age of menopause 59-55  Gravida 2  Irregular periods  Maternal age 27-30  Para 2  Other Problems [name], MD; 05/17/2019 9:30 AM)  Hemorrhoids  Myocardial infarction  Review of Systems [name] MD; 05/17/2019 9:30 AM)  General Present- Night Sweats. Not Present- Appetite Loss, Chills, Fatigue, Fever, Weight Gain and Weight Loss.  Skin Not Present- Change in Wart/Mole, Dryness, Hives, Jaundice, New Lesions, Non-Healing Wounds, Rash and Ulcer.  HEENT Not Present- Earache, Hearing Loss, Hoarseness, Nose Bleed, Oral Ulcers, Ringing in the Ears, Seasonal Allergies, Sinus Pain, Sore Throat, Visual Disturbances, Wears glasses/contact lenses and Yellow Eyes.  Respiratory Not Present- Bloody sputum, Chronic Cough, Difficulty  Breathing, Snoring and Wheezing.  Breast Not Present- Breast Mass, Breast Pain, Nipple Discharge and Skin Changes.  Cardiovascular Not Present- Chest Pain, Difficulty Breathing Lying Down, Leg Cramps, Palpitations, Rapid Heart Rate, Shortness of Breath and Swelling of Extremities.  Gastrointestinal Present- Constipation and Hemorrhoids. Not Present- Abdominal Pain, Bloating, Bloody Stool, Change in Bowel Habits, Chronic diarrhea, Difficulty Swallowing, Excessive gas, Gets full quickly at meals, Indigestion, Nausea, Rectal Pain and Vomiting.  Female Genitourinary Not Present- Frequency, Nocturia, Painful Urination, Pelvic Pain and Urgency.  Musculoskeletal Not Present- Back Pain, Joint Pain, Joint Stiffness, Muscle Pain, Muscle Weakness and Swelling of Extremities.  Neurological Not Present- Decreased Memory, Fainting, Headaches, Numbness, Seizures, Tingling, Tremor, Trouble walking and Weakness.  Psychiatric Not Present- Anxiety, Bipolar, Change in Sleep Pattern, Depression, Fearful and Frequent crying.  Endocrine Present- Hair Changes and Hot flashes. Not Present- Cold Intolerance, Excessive Hunger, Heat Intolerance and New Diabetes.  Hematology Not Present- Blood Thinners, Easy Bruising, Excessive bleeding, Gland problems, HIV and Persistent Infections.  Vitals (Tanisha A. Brown RMA; 05/17/2019 9:26 AM)  05/17/2019 9:26 AM  Weight: 163.4 lb Height: 67 in  Body Surface Area: 1.86 m Body Mass Index: 25.59 kg/m  Temp.: 98.1 F Pulse: 102 (Regular)  BP: 128/84 (Sitting, Left Arm, Standard)  Physical Exam [name] MD; 05/17/2019 9:56 AM)  General  Mental Status - Alert.  General Appearance - Not in acute distress, Not Sickly.  Orientation - Oriented X3.  Hydration - Well hydrated.  Voice - Normal.  Integumentary  Global Assessment  Upon inspection and palpation of skin surfaces of the - Axillae: non-tender, no inflammation or ulceration, no drainage. and Distribution of scalp and body hair  is normal.  General Characteristics  Temperature - normal warmth is noted.  Head and Neck  Head - normocephalic, atraumatic with no lesions or palpable masses.  Face  Global Assessment - atraumatic, no absence of expression.  Neck  Global Assessment - no abnormal movements, no bruit auscultated on the right, no bruit auscultated on the left, no decreased range of motion, non-tender.  Trachea - midline.  Thyroid  Gland Characteristics - non-tender.  Eye  Eyeball - Left - Extraocular movements intact, No Nystagmus - Left.  Eyeball - Right - Extraocular movements intact, No Nystagmus - Right.  Cornea - Left - No Hazy - Left.  Cornea - Right - No Hazy - Right.  Sclera/Conjunctiva - Left - No scleral icterus, No Discharge - Left.  Sclera/Conjunctiva - Right - No scleral icterus, No Discharge - Right.  Pupil - Left - Direct reaction to light normal.  Pupil - Right - Direct reaction to light normal.  ENMT  Ears  Pinna - Left - no drainage observed, no generalized tenderness observed. Pinna - Right - no drainage observed, no generalized tenderness observed.  Nose and Sinuses  External Inspection of the Nose - no destructive lesion observed. Inspection of the nares - Left - quiet respiration. Inspection of the nares - Right - quiet respiration.  Mouth and Throat  Lips - Upper Lip - no fissures observed, no pallor noted. Lower Lip - no fissures observed, no pallor noted. Nasopharynx - no discharge present. Oral Cavity/Oropharynx - Tongue - no dryness observed. Oral Mucosa - no cyanosis observed. Hypopharynx - no evidence of airway distress observed.  Chest and Lung Exam  Inspection  Movements - Normal and Symmetrical. Accessory muscles - No use of accessory muscles in breathing.  Palpation  Palpation of the chest reveals - Non-tender.  Auscultation  Breath sounds - Normal and Clear.  Cardiovascular  Auscultation  Rhythm - Regular. Murmurs & Other Heart Sounds - Auscultation of the heart  reveals - No Murmurs and No Systolic Clicks.  Abdomen  Inspection  Inspection of the abdomen reveals - No Visible peristalsis and No Abnormal pulsations. Umbilicus - No Bleeding, No Urine drainage.  Palpation/Percussion  Palpation and Percussion of the abdomen reveal - Soft, Non Tender, No Rebound tenderness, No Rigidity (guarding) and No Cutaneous hyperesthesia.  Note: Soft and flat. No umbilical hernia. No diastases. No guarding. No abdominal pain.  Female Genitourinary  Sexual Maturity  Tanner 5 - Adult hair pattern.  Note: No vaginal bleeding nor discharge  Rectal  Note: Posterior midline scarring some mild tunneling consistent with prior anal fissure. Mostly healed.  Large right anterior chronically prolapsed internal/external hemorrhoid. Anoscopy reveals a posterior midline anal canal pedunculated mass most likely consistent with hypertrophic anal papilla/anal polyp.  Exam done with assistance of female Medical Assistant in the room. Perianal skin clean with good hygiene. No pruritis ani. No pilonidal disease. No condyloma. No abscess/fistula. Normal sphincter tone. Tolerates digital and anoscopic rectal exam.  Peripheral Vascular  Upper Extremity  Inspection - Left - No Cyanotic nailbeds - Left, Not Ischemic. Inspection - Right - No Cyanotic nailbeds - Right, Not Ischemic.  Neurologic  Neurologic evaluation reveals - normal attention span and ability to concentrate, able to name objects and repeat phrases. Appropriate fund of knowledge , normal sensation  and normal coordination.  Mental Status  Affect - not angry, not paranoid.  Cranial Nerves - Normal Bilaterally.  Gait - Normal.  Neuropsychiatric  Mental status exam performed with findings of - able to articulate well with normal speech/language, rate, volume and coherence, thought content normal with ability to perform basic computations and apply abstract reasoning and no evidence of hallucinations, delusions, obsessions or  homicidal/suicidal ideation.  Note: Anxious but consolable  Musculoskeletal  Global Assessment  Spine, Ribs and Pelvis - no instability, subluxation or laxity. Right Upper Extremity - no instability, subluxation or laxity.  Lymphatic  Head & Neck  General Head & Neck Lymphatics: Bilateral - Description - No Localized lymphadenopathy.  Axillary  General Axillary Region: Bilateral - Description - No Localized lymphadenopathy.  Femoral & Inguinal  Generalized Femoral & Inguinal Lymphatics: Left - Description - No Localized lymphadenopathy. Right - Description - No Localized lymphadenopathy.  Results [name] MD; 05/17/2019 9:59 AM)  Procedures  Name  Value  Date  Hemorrhoids  Procedure  Anal exam: External Hemorrhoid  Internal exam:  Mass  prolapse  Other: Exam done with assistance of female Medical Assistant in the room. ......  ...... Large right anterior chronically prolapsed internal/external hemorrhoid. Posterior midline scarring some mild tunneling consistent with prior anal fissure. No significant anal stricture. Digital rectal exam & anoscopy reveals a posterior midline anal canal pedunculated mass most likely consistent with hypertrophic anal papilla/anal polyp. ......  ......   ......  ......  Perianal skin clean with good hygiene. No pruritis ani. No pilonidal disease. No condyloma. No abscess/fistula. Normal sphincter tone. Tolerates digital and anoscopic rectal exam.  Performed: 05/17/2019 9:56 AM   Assessment & Plan [name] MD; 05/17/2019 9:59 AM)  Natalia Leatherwood ANAL PAPILLA (K62.89)  Impression: It is longer today on exam. It is not it is not prolapsing out. It is not ulcerated. There is no nodularity. It is not actively bleeding.  It is reasonable to remove it but I don't have a hard indication at this time. She is hesitant to do anything at this time. My suspicion of malignancy or precancerous changes is low at this time. She did not have any adenomatous polyps  on the colonoscopy. Could do outpatient surgery to remove it.  She is ready for surgery  PROLAPSED INTERNAL HEMORRHOIDS, GRADE 4 (K64.3)  Impression: Large chronically prolapsed internal hemorrhoid with external component. However not actively bleeding. No major soiling or irritation with that at this time. Will not go away without surgery. It is proven already to be refractory to banding done last year many times.  She tried to hold off on any surgery as it is not bothering her since she is having soft daily bowel movements. She may reconsider this in the future. She is now ready for surgery  The anatomy & physiology of the anorectal region was discussed. The pathophysiology of hemorrhoids and differential diagnosis was discussed. Natural history progression was discussed. I stressed the importance of a bowel regimen to have daily soft bowel movements to minimize progression of disease. Goal of one BM / day ideal. Use of wet wipes, warm baths, avoiding straining, etc were emphasized.  Educational handouts further explaining the pathology, treatment options, and bowel regimen were given as well. The patient expressed understanding.   Current Plans  ANOSCOPY, DIAGNOSTIC (16010)  Pt Education - CCS Hemorrhoids (Joren Rehm): discussed with patient and provided information.  You are being scheduled for surgery - Our schedulers will call you.  You should  hear from our office's scheduling department within 5 working days about the location, date, and time of surgery. We try to make accommodations for patient's preferences in scheduling surgery, but sometimes the OR schedule or the surgeon's schedule prevents [place] from making those accommodations.  If you have not heard from our office [phone]) in 5 working days, call the office and ask for your surgeon's nurse.  If you have other questions about your diagnosis, plan, or surgery, call the office and ask for your surgeon's nurse.  Pt Education - Pamphlet  Given - The Hemorrhoid Book: discussed with patient and provided information.  The anatomy & physiology of the anorectal region was discussed. The pathophysiology of hemorrhoids and differential diagnosis was discussed. Natural history risks without surgery was discussed. I stressed the importance of a bowel regimen to have daily soft bowel movements to minimize progression of disease. Interventions such as sclerotherapy & banding were discussed.  The patient's symptoms are not adequately controlled by medicines and other non-operative treatments. I feel the risks & problems of no surgery outweigh the operative risks; therefore, I recommended surgery to treat the hemorrhoids by ligation, pexy, and possible resection.  Risks such as bleeding, infection, urinary difficulties, need for further treatment, heart attack, death, and other risks were discussed. I noted a good likelihood this will help address the problem. Goals of post-operative recovery were discussed as well. Possibility that this will not correct all symptoms was explained. Post-operative pain, bleeding, constipation, and other problems after surgery were discussed. We will work to minimize complications. Educational handouts further explaining the pathology, treatment options, and bowel regimen were given as well. Questions were answered. The patient expresses understanding & wishes to proceed with surgery.  ANAL FISSURE (K60.2)  Impression: Scarring consistent with prior anal fissures. No active problems this time.  Again Strother recommended fiber bowel regimen to minimize stress to the area. If she does have a flare, consider trial diltiazem or surgical partial sphincterotomy if that is refractory. Relaxed anal sphincter is against any urgent need at this time.  Current Plans  Pt Education - CCS Anal Fissure (Dayami Taitt)  CORONARY VASOSPASM (I20.1)  Impression: Episode of tachycardia and NSTEMI myocardial infarction March 2020 most likely  triggered by epinephrine injection for a regional block for planned orthopedic surgery.  She has excellent exercise tolerance now but has not been totally compliant on her cardiac medications. I recommend she reconsider starting her Coreg backup as had been recommended 6 months ago. She is due for her 6 month follow-up with cardiology anyway. I'll ask Dr. Acie Fredrickson and his group to double check there are no concerns prior to surgery.  Current Plans  I recommended obtaining preoperative cardiac clearance. I am concerned about the health of the patient and the ability to tolerate the operation. Therefore, we will request clearance by cardiology to better assess operative risk & see if a reevaluation, further workup, etc is needed. Also recommendations on how medications such as for anticoagulation and blood pressure should be managed/held/restarted after surgery.  [name], MD, FACS, MASCRS  Gastrointestinal and Minimally Invasive Surgery  [facility]  [address], [place]  [place], [place] [address]  [phone] Main / Paging  [phone] Fax

## 2019-07-29 NOTE — Anesthesia Procedure Notes (Signed)
Procedure Name: LMA Insertion Date/Time: 07/29/2019 7:37 AM Performed by: Gerald Leitz, CRNA Pre-anesthesia Checklist: Patient identified, Patient being monitored, Timeout performed, Emergency Drugs available and Suction available Patient Re-evaluated:Patient Re-evaluated prior to induction Oxygen Delivery Method: Circle system utilized Preoxygenation: Pre-oxygenation with 100% oxygen Induction Type: IV induction Ventilation: Mask ventilation without difficulty LMA: LMA inserted and LMA with gastric port inserted LMA Size: 4.0 Tube type: Oral Number of attempts: 1 Placement Confirmation: positive ETCO2 and breath sounds checked- equal and bilateral Tube secured with: Tape Dental Injury: Teeth and Oropharynx as per pre-operative assessment

## 2019-07-29 NOTE — Op Note (Signed)
07/29/2019  9:05 AM  PATIENT:  Jessica Parks  54 y.o. 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 Excision of anal canal polyp. Excision of anal fissure/superficial fistula Internal hemorrhoidal ligation and pexy Anorectal examination under anesthesia  SURGEON:  [name], MD  ANESTHESIA:   General Anorectal & Local field block (0.25% bupivacaine with epinephrine mixed with Liposomal bupivacaine (Experel)   EBL:  Total I/O In: 1200 [I.V.:1200] Out: 30 [Blood:30].  See operative record  Delay start of Pharmacological VTE agent (>24hrs) due to surgical blood loss or risk of bleeding:  NO  DRAINS: NONE  SPECIMEN:   Internal & external hemorrhoidx1  Anterior external hemorrhoidal tag.  Posterior midline anal canal scarring with possible superficial fistula at site of old anal fissure  DISPOSITION OF SPECIMEN:  PATHOLOGY  COUNTS:  YES  PLAN OF CARE: Discharge home after PACU  PATIENT DISPOSITION:  PACU - hemodynamically stable.  INDICATION: Pleasant patient with struggles with hemorrhoids.  Large chronically prolapsed hemorrhoid.  Also prolapsing pedunculated polyp at level of anal crypts most likely benign.  Scarring with history of prior anal fissure with some intermittent swelling  suspicious for recurrent fissure versus fistula.  Not able to be managed in the office despite an improved bowel regimen.  I recommended  examination under anesthesia and surgical treatment:  The anatomy & physiology of the anorectal region was discussed.  The pathophysiology of hemorrhoids and differential diagnosis was discussed.  Natural history risks without surgery was discussed.   I stressed the importance of a bowel regimen to have daily soft bowel movements to minimize progression of disease.  Interventions such as sclerotherapy & banding were discussed.  The patient's symptoms are not adequately controlled by medicines and other non-operative treatments.  I feel the risks & problems of no surgery outweigh the operative risks; therefore, I recommended surgery to treat the hemorrhoids by ligation, pexy, and possible resection.  Risks such as bleeding, infection, need for further treatment, heart attack, death, and other risks were discussed.   I noted a good likelihood this will help address the problem.  Goals of post-operative recovery were discussed as well.  Possibility that this will not correct all symptoms was explained.  Post-operative pain, bleeding, constipation, urinary difficulties, and other problems after surgery were discussed.  We will work to minimize complications.   Educational handouts further explaining the pathology, treatment options, and bowel regimen were given as well.  Questions were answered.  The patient expresses understanding & wishes to proceed with surgery.  OR FINDINGS: Large right anterior chronically prolapsed hemorrhoid with external component.  Anterior midline anal hemorrhoidal tissue without enlarged midline raphae.  Right posterior anal crypt polyp pedunculated smooth but elongated prolapsing out intermittently.  Excised.  Posterior midline scarring and swelling at site of presumed prior fissure.  Some thickening and tubularization of the scar concerning  for possible superficial fistula.  Excised  DESCRIPTION:   Informed consent was confirmed. Patient underwent general anesthesia without difficulty. Patient was placed into prone positioning.  The perianal region was prepped and draped in sterile fashion. Surgical time-out confirmed our plan.  I did digital rectal examination and then transitioned over to anoscopy to get a sense of the anatomy.  Findings noted above.   I proceeded to do hemorrhoidal ligation and pexy.  I used a 2-0 Vicryl suture on a UR-6 needle in a figure-of-eight fashion 6 cm proximal to the anal verge.  I started at the largest hemorrhoid pile, right anterior.  Because of redundant hemorrhoidal tissue too bulky to merely ligate or pexy, I excised the excess internal hemorrhoid piles longitudinally in a fusiform biconcave fashion, at the  left anterior and right anterior location, sparing the anal canal to avoid narrowing.  I then ran that stitch longitudinally more distally to close the hemorrhoidectomy wound to the anal verge over a Parks self retaining retractor & occasionally a large Hill-Furgeson retractor to avoid narrowing of the anal canal.  I then tied that stitch down to cause a hemorrhoidopexy.   I used a similar technique to excise the right posterior pedunculated anal crypt polyp that was prolapsing.  I did not need to come down to the anal verge without suture.  I then did hemorrhoidal ligation and pexy at the other 4 columns.  At the completion of this, all 6 anorectal columns were ligated and pexied in the classic hexagonal fashion (right anterior/lateral/posterior, left anterior/lateral/posterior).   The posterior midline site of old fissure was somewhat heaped up and irritated.  I cannot prove a definite opening but concerning for perhaps a chronic superficial fistula.  I excised the elevated scar longitudinally.  I closed that wound with interrupted chromic horizontal mattress sutures, leaving the last 1 cm opening to  allow it to drain.  I closed the external part of the hemorrhoidectomy wounds with interrupted horizontal mattress 2-0 chromic suture, leaving the last 7 mm open to allow natural drainage.  Patient had some persistent external hemorrhoidal tissue anteriorly that I carefully excised down to more normal level.  I ended up closing the anterior external wounds at the anal verge using horizontal mattress sutures interrupted in a horizontal fashion to avoid any narrowing.  I redid anoscopy & examination.  At completion of this, all hemorrhoids had been removed or reduced into the rectum.  There is no more prolapse.  Internal & external anatomy was more more normal.  Hemostasis was good.  Fluffed gauze was on-laid over the perianal region.  No packing done.  Patient is being extubated go to go to the recovery room.  I had discussed postop care in detail with the patient in the preop holding area.  Instructions for post-operative recovery and prescriptions are written. I discussed operative findings, updated the patient's status, discussed probable steps to recovery, and gave postoperative recommendations to the Patient's sister, Donnamae Jude.  Recommendations were made.  Questions were answered.  She expressed understanding & appreciation.  [name], M.D., F.A.C.S. Gastrointestinal and Minimally Invasive Surgery [facility] [address], [place], [place] [address] [phone] Main / Paging

## 2019-07-29 NOTE — Anesthesia Preprocedure Evaluation (Signed)
Anesthesia Evaluation  Patient identified by MRN, date of birth, ID band Patient awake    Reviewed: Allergy & Precautions, NPO status , Patient's Chart, lab work & pertinent test results  Airway Mallampati: II  TM Distance: >3 FB Neck ROM: Full    Dental no notable dental hx.    Pulmonary neg pulmonary ROS,    Pulmonary exam normal breath sounds clear to auscultation       Cardiovascular + Past MI  Normal cardiovascular exam Rhythm:Regular Rate:Normal     Neuro/Psych negative neurological ROS  negative psych ROS   GI/Hepatic negative GI ROS, Neg liver ROS,   Endo/Other  negative endocrine ROS  Renal/GU negative Renal ROS  negative genitourinary   Musculoskeletal negative musculoskeletal ROS (+)   Abdominal   Peds negative pediatric ROS (+)  Hematology negative hematology ROS (+)   Anesthesia Other Findings   Reproductive/Obstetrics negative OB ROS                             Anesthesia Physical Anesthesia Plan  ASA: II  Anesthesia Plan: General   Post-op Pain Management:    Induction: Intravenous  PONV Risk Score and Plan: 3 and Ondansetron, Dexamethasone, Midazolam and Treatment may vary due to age or medical condition  Airway Management Planned: Oral ETT  Additional Equipment:   Intra-op Plan:   Post-operative Plan: Extubation in OR  Informed Consent: I have reviewed the patients History and Physical, chart, labs and discussed the procedure including the risks, benefits and alternatives for the proposed anesthesia with the patient or authorized representative who has indicated his/her understanding and acceptance.     Dental advisory given  Plan Discussed with: CRNA and Surgeon  Anesthesia Plan Comments:         Anesthesia Quick Evaluation

## 2019-07-29 NOTE — Transfer of Care (Signed)
Immediate Anesthesia Transfer of Care Note  Patient: Jessica Parks  Procedure(s) Performed: Procedure(s): ANORECTAL EXAM UNDER ANESTHESIA (N/A) EXCISION ANAL CANAL MASS (N/A) REMOVAL OF HEMORRHOID (N/A)  Patient Location: PACU  Anesthesia Type:General  Level of Consciousness: Alert, Awake, Oriented  Airway & Oxygen Therapy: Patient Spontanous Breathing  Post-op Assessment: Report given to RN  Post vital signs: Reviewed and stable  Last Vitals:  Vitals:   07/29/19 0607  BP: (!) 173/82  Pulse: 66  Resp: 16  Temp: 36.9 C  SpO2: 123XX123    Complications: No apparent anesthesia complications

## 2019-07-29 NOTE — Anesthesia Postprocedure Evaluation (Signed)
Anesthesia Post Note  Patient: Jessica Parks  Procedure(s) Performed: ANORECTAL EXAM UNDER ANESTHESIA (N/A Rectum) EXCISION ANAL CANAL MASS (N/A Rectum) REMOVAL OF HEMORRHOID (N/A Rectum)     Patient location during evaluation: PACU Anesthesia Type: General Level of consciousness: awake and alert Pain management: pain level controlled Vital Signs Assessment: post-procedure vital signs reviewed and stable Respiratory status: spontaneous breathing, nonlabored ventilation, respiratory function stable and patient connected to nasal cannula oxygen Cardiovascular status: blood pressure returned to baseline and stable Postop Assessment: no apparent nausea or vomiting Anesthetic complications: no    Last Vitals:  Vitals:   07/29/19 0920 07/29/19 0930  BP:  (!) 147/94  Pulse: 62 63  Resp: 17 14  Temp:    SpO2: 100% 100%    Last Pain:  Vitals:   07/29/19 0930  TempSrc:   PainSc: 4                  Caley Ciaramitaro S

## 2019-08-01 LAB — SURGICAL PATHOLOGY

## 2019-08-22 ENCOUNTER — Encounter (HOSPITAL_COMMUNITY): Payer: Self-pay | Admitting: Surgery

## 2019-11-02 NOTE — Telephone Encounter (Signed)
I spoke with patient. She reports for the last 2 weeks BP has been running lower.  Prior to this was normal.  She takes Coreg in the morning and then goes for a 5 mile walk everyday (except Saturday and Sunday).  She checks BP when she returns from her walk and also in the afternoon.  She is staying hydrated.  She reports occasional BP readings of 111-117/74-80 over the last 2 weeks but most readings have been 90's-100/60's.  Heart rate in the 60's.  Stopped taking afternoon dose of Coreg recently due to low BP. I told her to hold Coreg for now and I would send to Dr Acie Fredrickson for further instructions.  Also would like to know if Dr Acie Fredrickson feels it is OK for her to continue walking daily and if this could be contributing to her BP readings.

## 2019-11-03 ENCOUNTER — Other Ambulatory Visit: Payer: Self-pay | Admitting: Nurse Practitioner

## 2019-11-28 ENCOUNTER — Encounter: Payer: Self-pay | Admitting: Cardiovascular Disease

## 2019-11-28 ENCOUNTER — Other Ambulatory Visit: Payer: Self-pay

## 2019-11-28 ENCOUNTER — Ambulatory Visit (INDEPENDENT_AMBULATORY_CARE_PROVIDER_SITE_OTHER): Payer: BC Managed Care – PPO | Admitting: Cardiovascular Disease

## 2019-11-28 VITALS — BP 128/68 | HR 84 | Ht 67.0 in | Wt 157.0 lb

## 2019-11-28 DIAGNOSIS — I214 Non-ST elevation (NSTEMI) myocardial infarction: Secondary | ICD-10-CM | POA: Diagnosis not present

## 2019-11-28 DIAGNOSIS — I428 Other cardiomyopathies: Secondary | ICD-10-CM

## 2019-11-28 NOTE — Patient Instructions (Signed)
Medication Instructions:  Your physician recommends that you continue on your current medications as directed. Please refer to the Current Medication list given to you today.  *If you need a refill on your cardiac medications before your next appointment, please call your pharmacy*   Lab Work: None If you have labs (blood work) drawn today and your tests are completely normal, you will receive your results only by:  [place] (if you have MyChart) OR  A paper copy in the mail If you have any lab test that is abnormal or we need to change your treatment, we will call you to review the results.   Testing/Procedures: Your physician has requested that you have an echocardiogram 1 week prior to seeing Dr. Acie Fredrickson back in 1 year. Echocardiography is a painless test that uses sound waves to create images of your heart. It provides your doctor with information about the size and shape of your heart and how well your hearts chambers and valves are working. This procedure takes approximately one hour. There are no restrictions for this procedure.     Follow-Up: At [facility], you and your health needs are our priority.  As part of our continuing mission to provide you with exceptional heart care, we have created designated Provider Care Teams.  These Care Teams include your primary Cardiologist (physician) and Advanced Practice Providers (APPs -  Physician Assistants and Nurse Practitioners) who all work together to provide you with the care you need, when you need it.  We recommend signing up for the patient portal called "MyChart".  Sign up information is provided on this After Visit Summary.  MyChart is used to connect with patients for Virtual Visits (Telemedicine).  Patients are able to view lab/test results, encounter notes, upcoming appointments, etc.  Non-urgent messages can be sent to your provider as well.   To learn more about what you can do with MyChart, go to  [email or web address].    Your next appointment:   12 month(s)  The format for your next appointment:   In Person  Provider:   You may see Mertie Moores, MD or one of the following Advanced Practice Providers on your designated Care Team:    Richardson Dopp, PA-C  Robbie Lis, [place]    Other Instructions

## 2019-11-28 NOTE — Progress Notes (Signed)
Cardiology Office Note:    Date:  11/28/2019   ID:  Jessica Parks, DOB 1965/05/01, MRN [id number]  PCP:  Glendale Chard, MD  Cardiologist:  Mertie Moores, MD  Electrophysiologist:  None   Referring MD: Glendale Chard, MD   No chief complaint on file.    Aug. 28, 2020    Jessica Parks is a 54 y.o. 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   Echocardiogram on June 12, 2018 revealed mildly depressed left ventricular systolic function with an ejection fraction of 40 to 45%.  Threre was severe hypokinesis in the basal and mid inferoseptal, anterior septal, basal anterior walls.   I saw her as a virtual visit in March, 2020 . We started her on Lisinopril at this visit.  She is also on Coreg.  But she ran out of the coreg for the past month.  She stopped the Lisinopril because her BP was very low  Walks every day  - 3-5 miles a day  No CP  November 28, 2019: Jessica Parks is seen today for follow-up of her stress-induced cardiomyopathy due to an overdose of local epinephrine rid injected into her shoulder joint. Saw Vin in feb. 2021  BP was low,   We stopped her CHF meds in July, 2021 Walks 3-5 miles a day  Doing well. No cp     Past Medical History:  Diagnosis Date  . Anemia   . Anxiety disorder   . Arthritis   . Coronary artery disease   . Frozen shoulder    left- PT only 04/28/2018  . Hemorrhoids     Past Surgical History:  Procedure Laterality Date  . BREAST BIOPSY Left 2014   benign  . HEMORRHOID SURGERY N/A 07/29/2019   Procedure: REMOVAL OF HEMORRHOID;  Surgeon: Michael Boston, MD;  Location: [facility];  Service: General;  Laterality: N/A;  . MASS EXCISION N/A 07/29/2019   Procedure: EXCISION ANAL CANAL MASS;  Surgeon: Michael Boston, MD;  Location: [facility];  Service: General;  Laterality: N/A;  . RECTAL EXAM UNDER ANESTHESIA N/A 07/29/2019    Procedure: ANORECTAL EXAM UNDER ANESTHESIA;  Surgeon: Michael Boston, MD;  Location: [facility];  Service: General;  Laterality: N/A;  . SHOULDER ARTHROSCOPY WITH SUBACROMIAL DECOMPRESSION Left 06/11/2018   Procedure: LEFT SHOULDER ARTHROSCOPY WITH EXTENSIVE DEBRIDEMENT, LYSIS OF ADHESIONS, SUBACROMIAL DECOMPRESSION AND MANIPULATION UNDER ANESTHESIA;  Surgeon: Leandrew Koyanagi, MD;  Location: [facility];  Service: Orthopedics;  Laterality: Left;  . TUBAL LIGATION      Current Medications: Current Meds  Medication Sig  . Ascorbic Acid (VITAMIN C ADULT GUMMIES PO) Take by mouth.  . B-12, Methylcobalamin, 1000 MCG SUBL Place 5,000 mcg under the tongue. Take one dropper by mouth daily  . Cholecalciferol (VITAMIN D3) 50 MCG (2000 UT) CHEW Chew 2,000 Units by mouth. Take two gummies daily  . Multiple Vitamin (MULTIVITAMIN) tablet Take 1 tablet by mouth daily.  . NON FORMULARY   . polyethylene glycol (MIRALAX / GLYCOLAX) 17 g packet Take 17 g by mouth daily.     Allergies:   Epinephrine and Oxycodone   Social History   Socioeconomic History  . Marital status: Married    Spouse name: Not on file  . Number of children: 2  . Years of education: Not on file  . Highest education level: Not on file  Occupational History  .  Occupation: unemployed    Fish farm manager: [employer]  Tobacco Use  . Smoking status: Never Smoker  . Smokeless tobacco: Never Used  Vaping Use  . Vaping Use: Never used  Substance and Sexual Activity  . Alcohol use: Yes    Comment: socially  . Drug use: No  . Sexual activity: Yes    Partners: Male    Birth control/protection: Surgical  Other Topics Concern  . Not on file  Social History Narrative  . Not on file   Social Determinants of Health   Financial Resource Strain:   . Difficulty of Paying Living Expenses: Not on file  Food Insecurity:   . Worried About Charity fundraiser in the Last Year: Not on file  . Ran Out of Food in the Last  Year: Not on file  Transportation Needs:   . Lack of Transportation (Medical): Not on file  . Lack of Transportation (Non-Medical): Not on file  Physical Activity:   . Days of Exercise per Week: Not on file  . Minutes of Exercise per Session: Not on file  Stress:   . Feeling of Stress : Not on file  Social Connections:   . Frequency of Communication with Friends and Family: Not on file  . Frequency of Social Gatherings with Friends and Family: Not on file  . Attends Religious Services: Not on file  . Active Member of Clubs or Organizations: Not on file  . Attends Archivist Meetings: Not on file  . Marital Status: Not on file     Family History: The patient's family history includes Breast cancer in her maternal aunt, maternal aunt, and maternal grandmother; Diabetes in her maternal aunt; Hypertension in her father. There is no history of Colon cancer or Rectal cancer.  ROS:   Please see the history of present illness.     All other systems reviewed and are negative.  EKGs/Labs/Other Studies Reviewed:    The following studies were reviewed today:   EKG:      Recent Labs: 12/06/2018: ALT 9; BUN 10; Creatinine, Ser 0.68; Hemoglobin 11.8; Platelets 189; Potassium 4.0; Sodium 139; TSH 2.120  Recent Lipid Panel    Component Value Date/Time   CHOL 176 12/06/2018 1550   TRIG 131 12/06/2018 1550   HDL 56 12/06/2018 1550   CHOLHDL 3.1 12/06/2018 1550   LDLCALC 97 12/06/2018 1550    Physical Exam:    Physical Exam: Blood pressure 128/68, pulse 84, height 5\' 7"  (1.702 m), weight 157 lb (71.2 kg), SpO2 98 %.  GEN:  Well nourished, well developed in no acute distress HEENT: Normal NECK: No JVD; No carotid bruits LYMPHATICS: No lymphadenopathy CARDIAC: RRR , no murmurs, rubs, gallops RESPIRATORY:  Clear to auscultation without rales, wheezing or rhonchi  ABDOMEN: Soft, non-tender, non-distended MUSCULOSKELETAL:  No edema; No deformity  SKIN: Warm and  dry NEUROLOGIC:  Alert and oriented x 3  ASSESSMENT:    1. Nonischemic cardiomyopathy (HCC)    PLAN:    In order of problems listed above:  Stress-induced myocardial infarction:  . [place] is doing very well.  She stopped her carvedilol because of some low blood pressure.  She is feeling well and has not had any symptoms.  At this point I do not think that she needs to restart these medications.  I have asked her to continue to pay very close attention to any symptoms of heart failure.  I will see her again in 1 year.  We will plan  on getting an echocardiogram the week before the office visit.   Medication Adjustments/Labs and Tests Ordered: Current medicines are reviewed at length with the patient today.  Concerns regarding medicines are outlined above.  Orders Placed This Encounter  Procedures  . ECHOCARDIOGRAM COMPLETE   No orders of the defined types were placed in this encounter.    Patient Instructions  Medication Instructions:  Your physician recommends that you continue on your current medications as directed. Please refer to the Current Medication list given to you today.  *If you need a refill on your cardiac medications before your next appointment, please call your pharmacy*   Lab Work: None If you have labs (blood work) drawn today and your tests are completely normal, you will receive your results only by: [place] MyChart Message (if you have MyChart) OR . A paper copy in the mail If you have any lab test that is abnormal or we need to change your treatment, we will call you to review the results.   Testing/Procedures: Your physician has requested that you have an echocardiogram 1 week prior to seeing Dr. Acie Fredrickson back in 1 year. Echocardiography is a painless test that uses sound waves to create images of your heart. It provides your doctor with information about the size and shape of your heart and how well your heart's chambers and valves are working. This procedure  takes approximately one hour. There are no restrictions for this procedure.     Follow-Up: At [facility], you and your health needs are our priority.  As part of our continuing mission to provide you with exceptional heart care, we have created designated Provider Care Teams.  These Care Teams include your primary Cardiologist (physician) and Advanced Practice Providers (APPs -  Physician Assistants and Nurse Practitioners) who all work together to provide you with the care you need, when you need it.  We recommend signing up for the patient portal called "MyChart".  Sign up information is provided on this After Visit Summary.  MyChart is used to connect with patients for Virtual Visits (Telemedicine).  Patients are able to view lab/test results, encounter notes, upcoming appointments, etc.  Non-urgent messages can be sent to your provider as well.   To learn more about what you can do with MyChart, go to [email or web address].    Your next appointment:   12 month(s)  The format for your next appointment:   In Person  Provider:   You may see Mertie Moores, MD or one of the following Advanced Practice Providers on your designated Care Team:    Richardson Dopp, PA-C  Robbie Lis, [place]    Other Instructions      Signed, Mertie Moores, MD  11/28/2019 5:54 PM    [place]

## 2019-12-14 ENCOUNTER — Encounter: Payer: Self-pay | Admitting: Internal Medicine

## 2019-12-14 ENCOUNTER — Ambulatory Visit: Payer: BC Managed Care – PPO | Admitting: Internal Medicine

## 2019-12-14 ENCOUNTER — Other Ambulatory Visit: Payer: Self-pay

## 2019-12-14 VITALS — BP 124/78 | HR 66 | Temp 97.9°F | Ht 67.0 in | Wt 158.4 lb

## 2019-12-14 DIAGNOSIS — Z0000 Encounter for general adult medical examination without abnormal findings: Secondary | ICD-10-CM | POA: Diagnosis not present

## 2019-12-14 NOTE — Patient Instructions (Signed)
Health Maintenance, Female Adopting a healthy lifestyle and getting preventive care are important in promoting health and wellness. Ask your health care provider about:  The right schedule for you to have regular tests and exams.  Things you can do on your own to prevent diseases and keep yourself healthy. What should I know about diet, weight, and exercise? Eat a healthy diet   Eat a diet that includes plenty of vegetables, fruits, low-fat dairy products, and lean protein.  Do not eat a lot of foods that are high in solid fats, added sugars, or sodium. Maintain a healthy weight Body mass index (BMI) is used to identify weight problems. It estimates body fat based on height and weight. Your health care provider can help determine your BMI and help you achieve or maintain a healthy weight. Get regular exercise Get regular exercise. This is one of the most important things you can do for your health. Most adults should:  Exercise for at least 150 minutes each week. The exercise should increase your heart rate and make you sweat (moderate-intensity exercise).  Do strengthening exercises at least twice a week. This is in addition to the moderate-intensity exercise.  Spend less time sitting. Even light physical activity can be beneficial. Watch cholesterol and blood lipids Have your blood tested for lipids and cholesterol at 54 years of age, then have this test every 5 years. Have your cholesterol levels checked more often if:  Your lipid or cholesterol levels are high.  You are older than 54 years of age.  You are at high risk for heart disease. What should I know about cancer screening? Depending on your health history and family history, you may need to have cancer screening at various ages. This may include screening for:  Breast cancer.  Cervical cancer.  Colorectal cancer.  Skin cancer.  Lung cancer. What should I know about heart disease, diabetes, and high blood  pressure? Blood pressure and heart disease  High blood pressure causes heart disease and increases the risk of stroke. This is more likely to develop in people who have high blood pressure readings, are of African descent, or are overweight.  Have your blood pressure checked: ? Every 3-5 years if you are 18-39 years of age. ? Every year if you are 40 years old or older. Diabetes Have regular diabetes screenings. This checks your fasting blood sugar level. Have the screening done:  Once every three years after age 40 if you are at a normal weight and have a low risk for diabetes.  More often and at a younger age if you are overweight or have a high risk for diabetes. What should I know about preventing infection? Hepatitis B If you have a higher risk for hepatitis B, you should be screened for this virus. Talk with your health care provider to find out if you are at risk for hepatitis B infection. Hepatitis C Testing is recommended for:  Everyone born from 1945 through 1965.  Anyone with known risk factors for hepatitis C. Sexually transmitted infections (STIs)  Get screened for STIs, including gonorrhea and chlamydia, if: ? You are sexually active and are younger than 54 years of age. ? You are older than 54 years of age and your health care provider tells you that you are at risk for this type of infection. ? Your sexual activity has changed since you were last screened, and you are at increased risk for chlamydia or gonorrhea. Ask your health care provider if   you are at risk.  Ask your health care provider about whether you are at high risk for HIV. Your health care provider may recommend a prescription medicine to help prevent HIV infection. If you choose to take medicine to prevent HIV, you should first get tested for HIV. You should then be tested every 3 months for as long as you are taking the medicine. Pregnancy  If you are about to stop having your period (premenopausal) and  you may become pregnant, seek counseling before you get pregnant.  Take 400 to 800 micrograms (mcg) of folic acid every day if you become pregnant.  Ask for birth control (contraception) if you want to prevent pregnancy. Osteoporosis and menopause Osteoporosis is a disease in which the bones lose minerals and strength with aging. This can result in bone fractures. If you are 65 years old or older, or if you are at risk for osteoporosis and fractures, ask your health care provider if you should:  Be screened for bone loss.  Take a calcium or vitamin D supplement to lower your risk of fractures.  Be given hormone replacement therapy (HRT) to treat symptoms of menopause. Follow these instructions at home: Lifestyle  Do not use any products that contain nicotine or tobacco, such as cigarettes, e-cigarettes, and chewing tobacco. If you need help quitting, ask your health care provider.  Do not use street drugs.  Do not share needles.  Ask your health care provider for help if you need support or information about quitting drugs. Alcohol use  Do not drink alcohol if: ? Your health care provider tells you not to drink. ? You are pregnant, may be pregnant, or are planning to become pregnant.  If you drink alcohol: ? Limit how much you use to 0-1 drink a day. ? Limit intake if you are breastfeeding.  Be aware of how much alcohol is in your drink. In the [place] one drink equals one 12 oz bottle of beer (355 mL), one 5 oz glass of wine (148 mL), or one 1 oz glass of hard liquor (44 mL). General instructions  Schedule regular health, dental, and eye exams.  Stay current with your vaccines.  Tell your health care provider if: ? You often feel depressed. ? You have ever been abused or do not feel safe at home. Summary  Adopting a healthy lifestyle and getting preventive care are important in promoting health and wellness.  Follow your health care provider's instructions about healthy  diet, exercising, and getting tested or screened for diseases.  Follow your health care provider's instructions on monitoring your cholesterol and blood pressure. This information is not intended to replace advice given to you by your health care provider. Make sure you discuss any questions you have with your health care provider. Document Revised: 03/17/2018 Document Reviewed: 03/17/2018 Elsevier Patient Education  2020 [employer] Inc[employer]  

## 2019-12-14 NOTE — Progress Notes (Addendum)
I,Katawbba Wiggins,acting as a Education administrator for [name], MD.,have documented all relevant documentation on the behalf of [name], MD,as directed by  [name], MD while in the presence of [name], MD.  This visit occurred during the SARS-CoV-2 public health emergency.  Safety protocols were in place, including screening questions prior to the visit, additional usage of staff PPE, and extensive cleaning of exam room while observing appropriate contact time as indicated for disinfecting solutions.  Subjective:     [name] , female    DOB: 1966/03/11 , 54 y.o.   MRN: [id number]   Chief Complaint  Patient presents with  . Annual Exam    HPI  She is here today for a full physical examination. She is followed by Dr. Garwin Brothers for her GYN care.  She was last seen Dec 2020.     Past Medical History:  Diagnosis Date  . Anemia   . Anxiety disorder   . Arthritis   . Coronary artery disease   . Frozen shoulder    left- PT only 04/28/2018  . Hemorrhoids      Family History  Problem Relation Age of Onset  . Breast cancer Maternal Grandmother   . Breast cancer Maternal Aunt   . Breast cancer Maternal Aunt   . Diabetes Maternal Aunt   . Hypertension Father   . Kidney failure Father   . Colon cancer Neg Hx   . Rectal cancer Neg Hx      Current Outpatient Medications:  .  Ascorbic Acid (VITAMIN C ADULT GUMMIES PO), Take by mouth., Disp: , Rfl:  .  B-12, Methylcobalamin, 1000 MCG SUBL, Place 5,000 mcg under the tongue. Take one dropper by mouth daily not daily, Disp: , Rfl:  .  Multiple Vitamins-Minerals (CENTRUM SILVER 50+WOMEN PO), Take by mouth daily., Disp: , Rfl:  .  polyethylene glycol (MIRALAX / GLYCOLAX) 17 g packet, Take 17 g by mouth daily., Disp: , Rfl:  .  Psyllium (METAMUCIL FIBER PO), Take by mouth., Disp: , Rfl:  .  VITAMIN D PO, Take by mouth. drops, Disp: , Rfl:    Allergies  Allergen Reactions  . Epinephrine     Coronary  vasospasm from regional block injection  Pt states is not allergic  . Oxycodone Nausea Only    Dizziness & nausea      The patient states she uses tubal ligation for birth control. Last LMP was Patient's last menstrual period was 11/13/2019.. Negative for Dysmenorrhea. Negative for: breast discharge, breast lump(s), breast pain and breast self exam. Associated symptoms include abnormal vaginal bleeding. Pertinent negatives include abnormal bleeding (hematology), anxiety, decreased libido, depression, difficulty falling sleep, dyspareunia, history of infertility, nocturia, sexual dysfunction, sleep disturbances, urinary incontinence, urinary urgency, vaginal discharge and vaginal itching. Diet regular.The patient states her exercise level is  moderate.  . The patient's tobacco use is:  Social History   Tobacco Use  Smoking Status Never Smoker  Smokeless Tobacco Never Used  . She has been exposed to passive smoke. The patient's alcohol use is:  Social History   Substance and Sexual Activity  Alcohol Use Yes   Comment: socially    Review of Systems  Constitutional: Negative.   HENT: Negative.   Eyes: Negative.   Respiratory: Negative.   Cardiovascular: Negative.   Gastrointestinal: Negative.   Endocrine: Negative.   Genitourinary: Negative.   Musculoskeletal: Negative.   Skin: Negative.   Allergic/Immunologic: Negative.   Neurological: Negative.  Hematological: Negative.   Psychiatric/Behavioral: Negative.      Today's Vitals   12/14/19 0955  BP: 124/78  Pulse: 66  Temp: 97.9 F (36.6 C)  TempSrc: Oral  Weight: 158 lb 6.4 oz (71.8 kg)  Height: '5\' 7"'  (1.702 m)   Body mass index is 24.81 kg/m.  Wt Readings from Last 3 Encounters:  12/14/19 158 lb 6.4 oz (71.8 kg)  11/28/19 157 lb (71.2 kg)  07/29/19 162 lb 3.2 oz (73.6 kg)   Objective:  Physical Exam Constitutional:      General: She is not in acute distress.    Appearance: Normal appearance. She is  well-developed.  HENT:     Head: Normocephalic and atraumatic.     Right Ear: Hearing, tympanic membrane, ear canal and external ear normal. There is no impacted cerumen.     Left Ear: Hearing, tympanic membrane, ear canal and external ear normal. There is no impacted cerumen.     Nose:     Comments: Deferred, masked    Mouth/Throat:     Comments: Deferred, masked Eyes:     General: Lids are normal.     Extraocular Movements: Extraocular movements intact.     Conjunctiva/sclera: Conjunctivae normal.     Pupils: Pupils are equal, round, and reactive to light.     Funduscopic exam:    Right eye: No papilledema.        Left eye: No papilledema.  Neck:     Thyroid: No thyroid mass.     Vascular: No carotid bruit.  Cardiovascular:     Rate and Rhythm: Normal rate and regular rhythm.     Pulses: Normal pulses.     Heart sounds: Normal heart sounds. No murmur heard.   Pulmonary:     Effort: Pulmonary effort is normal.     Breath sounds: Normal breath sounds.  Chest:     Breasts:        Right: Inverted nipple present.        Left: Inverted nipple present.  Abdominal:     General: Abdomen is flat. Bowel sounds are normal. There is no distension.     Palpations: Abdomen is soft.     Tenderness: There is no abdominal tenderness.  Genitourinary:    Comments: Deferred. Musculoskeletal:        General: No swelling. Normal range of motion.     Cervical back: Full passive range of motion without pain, normal range of motion and neck supple.     Right lower leg: No edema.     Left lower leg: No edema.  Skin:    General: Skin is warm and dry.     Capillary Refill: Capillary refill takes less than 2 seconds.  Neurological:     General: No focal deficit present.     Mental Status: She is alert and oriented to person, place, and time.     Cranial Nerves: No cranial nerve deficit.     Sensory: No sensory deficit.  Psychiatric:        Mood and Affect: Mood normal.        Behavior:  Behavior normal.        Thought Content: Thought content normal.        Judgment: Judgment normal.         Assessment And Plan:     1. Routine general medical examination at health care facility Comments: A full exam was performed. She declined flu vaccine. Importance of monthly self breast exams  was discussed with the patient. PATIENT IS ADVISED TO GET 30-45 MINUTES REGULAR EXERCISE NO LESS THAN FOUR TO FIVE DAYS PER WEEK - BOTH WEIGHTBEARING EXERCISES AND AEROBIC ARE RECOMMENDED.  PATIENT IS ADVISED TO FOLLOW A HEALTHY DIET WITH AT LEAST SIX FRUITS/VEGGIES PER DAY, DECREASE INTAKE OF RED MEAT, AND TO INCREASE FISH INTAKE TO TWO DAYS PER WEEK.  MEATS/FISH SHOULD NOT BE FRIED, BAKED OR BROILED IS PREFERABLE.  I SUGGEST WEARING SPF 50 SUNSCREEN ON EXPOSED PARTS AND ESPECIALLY WHEN IN THE DIRECT SUNLIGHT FOR AN EXTENDED PERIOD OF TIME.  PLEASE AVOID FAST FOOD RESTAURANTS AND INCREASE YOUR WATER INTAKE. - VITAMIN D 25 Hydroxy (Vit-D Deficiency, Fractures) - CBC - CMP14+EGFR - Lipid panel - TSH     Patient was given opportunity to ask questions. Patient verbalized understanding of the plan and was able to repeat key elements of the plan. All questions were answered to their satisfaction.   [name], MD   I, [name], MD, have reviewed all documentation for this visit. The documentation on 12/14/19 for the exam, diagnosis, procedures, and orders are all accurate and complete.  THE PATIENT IS ENCOURAGED TO PRACTICE SOCIAL DISTANCING DUE TO THE COVID-19 PANDEMIC.

## 2019-12-15 LAB — CBC
Hematocrit: 37 % (ref 34.0–46.6)
Hemoglobin: 11.8 g/dL (ref 11.1–15.9)
MCH: 27.3 pg (ref 26.6–33.0)
MCHC: 31.9 g/dL (ref 31.5–35.7)
MCV: 86 fL (ref 79–97)
Platelets: 211 10*3/uL (ref 150–450)
RBC: 4.33 x10E6/uL (ref 3.77–5.28)
RDW: 11.1 % — ABNORMAL LOW (ref 11.7–15.4)
WBC: 5 10*3/uL (ref 3.4–10.8)

## 2019-12-15 LAB — CMP14+EGFR
ALT: 11 IU/L (ref 0–32)
AST: 15 IU/L (ref 0–40)
Albumin/Globulin Ratio: 1.6 (ref 1.2–2.2)
Albumin: 4.4 g/dL (ref 3.8–4.9)
Alkaline Phosphatase: 73 IU/L (ref 48–121)
BUN/Creatinine Ratio: 14 (ref 9–23)
BUN: 11 mg/dL (ref 6–24)
Bilirubin Total: 0.6 mg/dL (ref 0.0–1.2)
CO2: 26 mmol/L (ref 20–29)
Calcium: 9.5 mg/dL (ref 8.7–10.2)
Chloride: 102 mmol/L (ref 96–106)
Creatinine, Ser: 0.8 mg/dL (ref 0.57–1.00)
GFR calc Af Amer: 97 mL/min/{1.73_m2} (ref >59)
GFR calc non Af Amer: 84 mL/min/{1.73_m2} (ref >59)
Globulin, Total: 2.8 g/dL (ref 1.5–4.5)
Glucose: 125 mg/dL — ABNORMAL HIGH (ref 65–99)
Potassium: 3.8 mmol/L (ref 3.5–5.2)
Sodium: 140 mmol/L (ref 134–144)
Total Protein: 7.2 g/dL (ref 6.0–8.5)

## 2019-12-15 LAB — LIPID PANEL
Chol/HDL Ratio: 3.2 ratio (ref 0.0–4.4)
Cholesterol, Total: 174 mg/dL (ref 100–199)
HDL: 55 mg/dL (ref >39)
LDL Chol Calc (NIH): 97 mg/dL (ref 0–99)
Triglycerides: 124 mg/dL (ref 0–149)
VLDL Cholesterol Cal: 22 mg/dL (ref 5–40)

## 2019-12-15 LAB — VITAMIN D 25 HYDROXY (VIT D DEFICIENCY, FRACTURES): Vit D, 25-Hydroxy: 50.8 ng/mL (ref 30.0–100.0)

## 2020-02-21 ENCOUNTER — Encounter: Payer: Self-pay | Admitting: Internal Medicine

## 2020-03-15 ENCOUNTER — Other Ambulatory Visit: Payer: Self-pay | Admitting: Internal Medicine

## 2020-03-15 DIAGNOSIS — Z1231 Encounter for screening mammogram for malignant neoplasm of breast: Secondary | ICD-10-CM

## 2020-03-29 ENCOUNTER — Other Ambulatory Visit: Payer: Self-pay

## 2020-03-29 ENCOUNTER — Ambulatory Visit
Admission: RE | Admit: 2020-03-29 | Discharge: 2020-03-29 | Disposition: A | Discharge status: Home / Self Care | Payer: BC Managed Care – PPO | Source: Ambulatory Visit

## 2020-03-29 DIAGNOSIS — Z1231 Encounter for screening mammogram for malignant neoplasm of breast: Secondary | ICD-10-CM

## 2020-10-12 ENCOUNTER — Other Ambulatory Visit: Payer: Self-pay

## 2020-10-12 ENCOUNTER — Ambulatory Visit (INDEPENDENT_AMBULATORY_CARE_PROVIDER_SITE_OTHER): Payer: BC Managed Care – PPO | Admitting: Family Medicine

## 2020-10-12 ENCOUNTER — Encounter: Payer: Self-pay | Admitting: Family Medicine

## 2020-10-12 DIAGNOSIS — M25571 Pain in right ankle and joints of right foot: Secondary | ICD-10-CM | POA: Diagnosis not present

## 2020-10-12 DIAGNOSIS — M25561 Pain in right knee: Secondary | ICD-10-CM

## 2020-10-12 DIAGNOSIS — M79644 Pain in right finger(s): Secondary | ICD-10-CM

## 2020-10-12 NOTE — Patient Instructions (Signed)
   For arthritis:    Glucosamine Sufate:  1,000 mg twice daily  Turmeric:  500 mg twice daily

## 2020-10-12 NOTE — Progress Notes (Signed)
Office Visit Note   Patient: Jessica Parks           Date of Birth: [birth year]           MRN: [id number] Visit Date: 10/12/2020 Requested by: Glendale Chard, MD [address] [place],  [place] [address] PCP: Glendale Chard, MD  Subjective: Chief Complaint  Patient presents with   Right Thumb - Pain    Soreness. Pain with gripping objects x 1 month. Occasionally gets stuck. Had been playing basketball with her sons. She also does a lot of walking for exercise.   Right Foot - Pain    Intermittent pain in the posterior heel x 1 month. Has had issues with this in the past - she says she was diagnosed with a heel spur.   Right Knee - Pain    Intermittent pain in the anterior knee. Can feel this with doing squats and after much walking. Some "crunching" in the knee. Does not give give way. No locking. No swelling.     HPI: She is here for right thumb, right knee and right foot pain.  Symptoms started about a month ago after shooting baskets.  There was no definite trauma.  She began noticing pain in her thumb when gripping different objects.  It would occasionally get stuck.  Her right foot started hurting on the posterior aspect of the heel.  Her right knee began hurting anteriorly.  Since she made this appointment, all of her pains are almost gone but she wanted to come in anyway.  She is doing a great job trying to manage her health with lifestyle changes.  She is trying to stay off medications especially related to her heart, after the event she had around her shoulder surgery in 2020.                ROS:   All other systems were reviewed and are negative.  Objective: Vital Signs: There were no vitals taken for this visit.  Physical Exam:  General:  Alert and oriented, in no acute distress. Pulm:  Breathing unlabored. Psy:  Normal mood, congruent affect.  Right hand: There is no triggering at the thumb A1 pulley, good range of motion and no tenderness. Right knee: No  effusion, slight tenderness over the patella tendon. Right foot: No tenderness to palpation around the heel.     Imaging: No results found.  Assessment & Plan: Resolved right thumb, right foot and right knee pain. -She will start taking glucosamine and turmeric.  Follow-up as needed.     Procedures: No procedures performed        PMFS History: Patient Active Problem List   Diagnosis Date Noted   NSTEMI (non-ST elevated myocardial infarction) ([facility]) 07/05/2018   Encounter for intubation    Adhesive capsulitis of left shoulder 02/17/2018   Left shoulder pain 10/23/2017   Arthritis of right acromioclavicular joint 09/23/2016   History of colonic polyps 05/06/2011   Internal hemorrhoids 05/23/2009   CONSTIPATION 05/23/2009   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/28/2018   Hemorrhoids     Family History  Problem Relation Age of Onset   Breast cancer Maternal Grandmother    Breast cancer Maternal Aunt    Breast cancer Maternal Aunt    Diabetes Maternal Aunt    Hypertension  Father    Kidney failure Father    Colon cancer Neg Hx    Rectal cancer Neg Hx     Past Surgical History:  Procedure Laterality Date   BREAST BIOPSY Left 2014   benign   HEMORRHOID SURGERY N/A 07/29/2019   Procedure: REMOVAL OF HEMORRHOID;  Surgeon: Ladanian Kelter Boston, MD;  Location: [facility];  Service: General;  Laterality: N/A;   MASS EXCISION N/A 07/29/2019   Procedure: EXCISION ANAL CANAL MASS;  Surgeon: Anmol Paschen Boston, MD;  Location: [facility];  Service: General;  Laterality: N/A;   RECTAL EXAM UNDER ANESTHESIA N/A 07/29/2019   Procedure: ANORECTAL EXAM UNDER ANESTHESIA;  Surgeon: Eliel Dudding Boston, MD;  Location: [facility];  Service: General;  Laterality: N/A;   SHOULDER ARTHROSCOPY WITH SUBACROMIAL DECOMPRESSION Left 06/11/2018   Procedure:  LEFT SHOULDER ARTHROSCOPY WITH EXTENSIVE DEBRIDEMENT, LYSIS OF ADHESIONS, SUBACROMIAL DECOMPRESSION AND MANIPULATION UNDER ANESTHESIA;  Surgeon: Leandrew Koyanagi, MD;  Location: [facility];  Service: Orthopedics;  Laterality: Left;   TUBAL LIGATION     Social History   Occupational History   Occupation: unemployed    Fish farm manager: [employer]  Tobacco Use   Smoking status: Never   Smokeless tobacco: Never  Vaping Use   Vaping Use: Never used  Substance and Sexual Activity   Alcohol use: Yes    Comment: socially   Drug use: No   Sexual activity: Yes    Partners: Male    Birth control/protection: Surgical

## 2020-11-19 ENCOUNTER — Other Ambulatory Visit: Payer: Self-pay

## 2020-11-19 ENCOUNTER — Ambulatory Visit (HOSPITAL_COMMUNITY): Payer: BC Managed Care – PPO | Attending: Internal Medicine

## 2020-11-19 DIAGNOSIS — I428 Other cardiomyopathies: Secondary | ICD-10-CM | POA: Insufficient documentation

## 2020-11-19 LAB — ECHOCARDIOGRAM COMPLETE
Area-P 1/2: 3.71 cm2
S' Lateral: 2.6 cm

## 2020-11-25 ENCOUNTER — Encounter: Payer: Self-pay | Admitting: Cardiovascular Disease

## 2020-11-25 NOTE — Progress Notes (Signed)
Cardiology Office Note:    Date:  11/26/2020   ID:  Jessica Parks, DOB 11-26-1965, MRN [id number]  PCP:  Glendale Chard, MD  Cardiologist:  Mertie Moores, MD  Electrophysiologist:  None   Referring MD: Glendale Chard, MD   Chief Complaint  Patient presents with   Congestive Heart Failure     Aug. 28, 2020    Jessica Parks is a 55 y.o. 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   Echocardiogram on June 12, 2018 revealed mildly depressed left ventricular systolic function with an ejection fraction of 40 to 45%.  Threre was severe hypokinesis in the basal and mid inferoseptal, anterior septal, basal anterior walls.   I saw her as a virtual visit in March, 2020 . We started her on Lisinopril at this visit.  She is also on Coreg.  But she ran out of the coreg for the past month.  She stopped the Lisinopril because her BP was very low  Walks every day  - 3-5 miles a day  No CP  November 28, 2019: Jessica Parks is seen today for follow-up of her stress-induced cardiomyopathy due to an overdose of local epinephrine rid injected into her shoulder joint. Saw Vin in feb. 2021  BP was low,   We stopped her CHF meds in July, 2021 Walks 3-5 miles a day  Doing well. No cp   Aug. 22, 2022 Jessica Parks is seen today for follow up of her "stress induced " MI and cardiomyopathy - she had an overdose of epinephrine injected into her shoulder  Is doing well Back doing  her norm Echo shows normal LV function   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/28/2018   Hemorrhoids     Past Surgical History:  Procedure Laterality Date   BREAST BIOPSY Left 2014   benign   HEMORRHOID SURGERY N/A 07/29/2019   Procedure: REMOVAL OF HEMORRHOID;  Surgeon: Michael Boston, MD;  Location: [facility];   Service: General;  Laterality: N/A;   MASS EXCISION N/A 07/29/2019   Procedure: EXCISION ANAL CANAL MASS;  Surgeon: Michael Boston, MD;  Location: [facility];  Service: General;  Laterality: N/A;   RECTAL EXAM UNDER ANESTHESIA N/A 07/29/2019   Procedure: ANORECTAL EXAM UNDER ANESTHESIA;  Surgeon: Michael Boston, MD;  Location: [facility];  Service: General;  Laterality: N/A;   SHOULDER ARTHROSCOPY WITH SUBACROMIAL DECOMPRESSION Left 06/11/2018   Procedure: LEFT SHOULDER ARTHROSCOPY WITH EXTENSIVE DEBRIDEMENT, LYSIS OF ADHESIONS, SUBACROMIAL DECOMPRESSION AND MANIPULATION UNDER ANESTHESIA;  Surgeon: Leandrew Koyanagi, MD;  Location: [place] [facility];  Service: Orthopedics;  Laterality: Left;   TUBAL LIGATION      Current Medications: Current Meds  Medication Sig   Ca Phosphate-Cholecalciferol (CALCIUM/VITAMIN D3 GUMMIES PO) Take by mouth.   Multiple Vitamins-Minerals (MULTI-VITAMIN GUMMIES PO) Take by mouth.   polyethylene glycol (MIRALAX / GLYCOLAX) 17 g packet Take 17 g by mouth daily.   Psyllium (METAMUCIL FIBER PO) Take by mouth.   VITAMIN D PO Take by mouth. drops     Allergies:   Epinephrine and Oxycodone   Social History   Socioeconomic History   Marital status: Married    Spouse name: Not on file   Number of  children: 2   Years of education: Not on file   Highest education level: Not on file  Occupational History   Occupation: unemployed    Employer: [employer]  Tobacco Use   Smoking status: Never   Smokeless tobacco: Never  Vaping Use   Vaping Use: Never used  Substance and Sexual Activity   Alcohol use: Yes    Comment: socially   Drug use: No   Sexual activity: Yes    Partners: Male    Birth control/protection: Surgical  Other Topics Concern   Not on file  Social History Narrative   Not on file   Social Determinants of Health   Financial Resource Strain: Not on file  Food Insecurity: Not on file  Transportation Needs: Not  on file  Physical Activity: Not on file  Stress: Not on file  Social Connections: Not on file     Family History: The patient's family history includes Breast cancer in her maternal aunt, maternal aunt, and maternal grandmother; Diabetes in her maternal aunt; Hypertension in her father; Kidney failure in her father. There is no history of Colon cancer or Rectal cancer.  ROS:   Please see the history of present illness.     All other systems reviewed and are negative.  EKGs/Labs/Other Studies Reviewed:    The following studies were reviewed today:   EKG:    December 01, 2020: Normal sinus rhythm at 80.  No ST or T wave changes.  Left axis deviation.  Recent Labs: 12/14/2019: ALT 11; BUN 11; Creatinine, Ser 0.80; Hemoglobin 11.8; Platelets 211; Potassium 3.8; Sodium 140  Recent Lipid Panel    Component Value Date/Time   CHOL 174 12/14/2019 1102   TRIG 124 12/14/2019 1102   HDL 55 12/14/2019 1102   CHOLHDL 3.2 12/14/2019 1102   LDLCALC 97 12/14/2019 1102    Physical Exam:    Physical Exam: Blood pressure 132/76, pulse 80, height '5\' 7"'$  (1.702 m), weight 161 lb 3.2 oz (73.1 kg), SpO2 99 %.  GEN:  Well nourished, well developed in no acute distress HEENT: Normal NECK: No JVD; No carotid bruits LYMPHATICS: No lymphadenopathy CARDIAC: RRR ,   very soft systolic murmur  RESPIRATORY:  Clear to auscultation without rales, wheezing or rhonchi  ABDOMEN: Soft, non-tender, non-distended MUSCULOSKELETAL:  No edema; No deformity  SKIN: Warm and dry NEUROLOGIC:  Alert and oriented x 3   ASSESSMENT:    1. Nonischemic cardiomyopathy ([place])   2. NSTEMI (non-ST elevated myocardial infarction) ([place])     PLAN:       Stress-induced myocardial infarction:   due to overdose of epinephrine ( injection into her shoulder)  her LV function has normalized .   She is not on any meds.  I will see her on an as needed Basis  She has trivial MR     Medication Adjustments/Labs and Tests  Ordered: Current medicines are reviewed at length with the patient today.  Concerns regarding medicines are outlined above.  Orders Placed This Encounter  Procedures   EKG 12-Lead    No orders of the defined types were placed in this encounter.    Patient Instructions  Medication Instructions:  Your physician recommends that you continue on your current medications as directed. Please refer to the Current Medication list given to you today.  *If you need a refill on your cardiac medications before your next appointment, please call your pharmacy*   Lab Work: None Ordered If you have labs (blood work)  drawn today and your tests are completely normal, you will receive your results only by: MyChart Message (if you have MyChart) OR A paper copy in the mail If you have any lab test that is abnormal or we need to change your treatment, we will call you to review the results.   Testing/Procedures: None Ordered   Follow-Up: At [facility], you and your health needs are our priority.  As part of our continuing mission to provide you with exceptional heart care, we have created designated Provider Care Teams.  These Care Teams include your primary Cardiologist (physician) and Advanced Practice Providers (APPs -  Physician Assistants and Nurse Practitioners) who all work together to provide you with the care you need, when you need it.    Your next appointment:     As Needed  The format for your next appointment:   In Person or Virtual  Provider:   You may see Mertie Moores, MD or one of the following Advanced Practice Providers on your designated Care Team:   Richardson Dopp, PA-C Robbie Lis, [place]     Signed, Mertie Moores, MD  11/26/2020 12:21 PM    [place]

## 2020-11-26 ENCOUNTER — Other Ambulatory Visit: Payer: Self-pay

## 2020-11-26 ENCOUNTER — Encounter: Payer: Self-pay | Admitting: Cardiovascular Disease

## 2020-11-26 ENCOUNTER — Ambulatory Visit (INDEPENDENT_AMBULATORY_CARE_PROVIDER_SITE_OTHER): Payer: BC Managed Care – PPO | Admitting: Cardiovascular Disease

## 2020-11-26 VITALS — BP 132/76 | HR 80 | Ht 67.0 in | Wt 161.2 lb

## 2020-11-26 DIAGNOSIS — I428 Other cardiomyopathies: Secondary | ICD-10-CM | POA: Diagnosis not present

## 2020-11-26 DIAGNOSIS — I214 Non-ST elevation (NSTEMI) myocardial infarction: Secondary | ICD-10-CM

## 2020-11-26 NOTE — Patient Instructions (Signed)
Medication Instructions:  Your physician recommends that you continue on your current medications as directed. Please refer to the Current Medication list given to you today.  *If you need a refill on your cardiac medications before your next appointment, please call your pharmacy*   Lab Work: None Ordered If you have labs (blood work) drawn today and your tests are completely normal, you will receive your results only by: [place] (if you have MyChart) OR A paper copy in the mail If you have any lab test that is abnormal or we need to change your treatment, we will call you to review the results.   Testing/Procedures: None Ordered   Follow-Up: At [facility], you and your health needs are our priority.  As part of our continuing mission to provide you with exceptional heart care, we have created designated Provider Care Teams.  These Care Teams include your primary Cardiologist (physician) and Advanced Practice Providers (APPs -  Physician Assistants and Nurse Practitioners) who all work together to provide you with the care you need, when you need it.    Your next appointment:     As Needed  The format for your next appointment:   In Person or Virtual  Provider:   You may see Mertie Moores, MD or one of the following Advanced Practice Providers on your designated Care Team:   Richardson Dopp, PA-C [place], [place]

## 2020-12-17 ENCOUNTER — Ambulatory Visit (INDEPENDENT_AMBULATORY_CARE_PROVIDER_SITE_OTHER): Payer: BC Managed Care – PPO | Admitting: Internal Medicine

## 2020-12-17 ENCOUNTER — Other Ambulatory Visit: Payer: Self-pay

## 2020-12-17 ENCOUNTER — Encounter: Payer: Self-pay | Admitting: Internal Medicine

## 2020-12-17 VITALS — BP 120/62 | HR 82 | Temp 98.6°F | Ht 66.0 in | Wt 159.8 lb

## 2020-12-17 DIAGNOSIS — E559 Vitamin D deficiency, unspecified: Secondary | ICD-10-CM | POA: Diagnosis not present

## 2020-12-17 DIAGNOSIS — Z0000 Encounter for general adult medical examination without abnormal findings: Secondary | ICD-10-CM

## 2020-12-17 DIAGNOSIS — Z6825 Body mass index (BMI) 25.0-25.9, adult: Secondary | ICD-10-CM

## 2020-12-17 DIAGNOSIS — Z23 Encounter for immunization: Secondary | ICD-10-CM | POA: Diagnosis not present

## 2020-12-17 DIAGNOSIS — Z2821 Immunization not carried out because of patient refusal: Secondary | ICD-10-CM

## 2020-12-17 MED ORDER — SHINGRIX 50 MCG/0.5ML IM SUSR: 0.5000 mL | Freq: Once | INTRAMUSCULAR | 0 refills | Status: AC

## 2020-12-17 NOTE — Progress Notes (Signed)
I,Yamilka [employer] as a Education administrator for [name], MD.,have documented all relevant documentation on the behalf of [name], MD,as directed by  [name], MD while in the presence of [name], MD.  This visit occurred during the SARS-CoV-2 public health emergency.  Safety protocols were in place, including screening questions prior to the visit, additional usage of staff PPE, and extensive cleaning of exam room while observing appropriate contact time as indicated for disinfecting solutions.  Subjective:     [name] , female    DOB: 1966/02/27 , 55 y.o.   MRN: [id number]   Chief Complaint  Patient presents with   Annual Exam    HPI  Patient here for HM. She is followed by [facility] for her GYN care. She was last seen in 2021. She has no specific concerns or complaints at this time.     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/28/2018   Hemorrhoids      Family History  Problem Relation Age of Onset   Breast cancer Maternal Grandmother    Breast cancer Maternal Aunt    Breast cancer Maternal Aunt    Diabetes Maternal Aunt    Hypertension Father    Kidney failure Father    Colon cancer Neg Hx    Rectal cancer Neg Hx      Current Outpatient Medications:    Ca Phosphate-Cholecalciferol (CALCIUM/VITAMIN D3 GUMMIES PO), Take by mouth., Disp: , Rfl:    Multiple Vitamins-Minerals (MULTI-VITAMIN GUMMIES PO), Take by mouth., Disp: , Rfl:    polyethylene glycol (MIRALAX / GLYCOLAX) 17 g packet, Take 17 g by mouth daily., Disp: , Rfl:    Psyllium (METAMUCIL FIBER PO), Take by mouth., Disp: , Rfl:    VITAMIN D PO, Take by mouth. drops, Disp: , Rfl:    Zoster Vaccine Adjuvanted (SHINGRIX) injection, Inject 0.5 mLs into the muscle once for 1 dose., Disp: 0.5 mL, Rfl: 0   Allergies  Allergen Reactions   Epinephrine      Coronary vasospasm from regional block injection  Pt states is not allergic   Oxycodone Nausea Only    Dizziness & nausea      The patient states she uses none for birth control. Last LMP was No LMP recorded. (Menstrual status: Irregular Periods).. Negative for Dysmenorrhea. Negative for: breast discharge, breast lump(s), breast pain and breast self exam. Associated symptoms include abnormal vaginal bleeding. Pertinent negatives include abnormal bleeding (hematology), anxiety, decreased libido, depression, difficulty falling sleep, dyspareunia, history of infertility, nocturia, sexual dysfunction, sleep disturbances, urinary incontinence, urinary urgency, vaginal discharge and vaginal itching. Diet regular.The patient states her exercise level is  moderate - 2-3days per week.   . The patient's tobacco use is:  Social History   Tobacco Use  Smoking Status Never  Smokeless Tobacco Never  . She has been exposed to passive smoke. The patient's alcohol use is:  Social History   Substance and Sexual Activity  Alcohol Use Yes   Comment: socially   Review of Systems  Constitutional: Negative.   HENT: Negative.    Eyes: Negative.   Respiratory: Negative.    Cardiovascular: Negative.   Gastrointestinal: Negative.   Endocrine: Negative.   Genitourinary: Negative.   Musculoskeletal: Negative.   Skin: Negative.   Allergic/Immunologic: Negative.  Neurological: Negative.   Hematological: Negative.   Psychiatric/Behavioral: Negative.      Today's Vitals   12/17/20 1124  BP: 120/62  Pulse: 82  Temp: 98.6 F (37 C)  Weight: 159 lb 12.8 oz (72.5 kg)  Height: _0  (1.676 m)  PainSc: 0-No pain   Body mass index is 25.79 kg/m.  Wt Readings from Last 3 Encounters:  12/17/20 159 lb 12.8 oz (72.5 kg)  11/26/20 161 lb 3.2 oz (73.1 kg)  12/14/19 158 lb 6.4 oz (71.8 kg)     Objective:  Physical Exam Vitals and nursing note reviewed.  Constitutional:      Appearance: Normal appearance.   HENT:     Head: Normocephalic and atraumatic.     Right Ear: Tympanic membrane, ear canal and external ear normal.     Left Ear: Tympanic membrane, ear canal and external ear normal.     Nose:     Comments: Masked     Mouth/Throat:     Comments: Masked  Eyes:     Extraocular Movements: Extraocular movements intact.     Conjunctiva/sclera: Conjunctivae normal.     Pupils: Pupils are equal, round, and reactive to light.  Cardiovascular:     Rate and Rhythm: Normal rate and regular rhythm.     Pulses: Normal pulses.     Heart sounds: Normal heart sounds.  Pulmonary:     Effort: Pulmonary effort is normal.     Breath sounds: Normal breath sounds.  Chest:  Breasts:    Tanner Score is 5.     Right: Normal.     Left: Normal.  Abdominal:     General: Abdomen is flat. Bowel sounds are normal.     Palpations: Abdomen is soft.  Genitourinary:    Comments: deferred Musculoskeletal:        General: Normal range of motion.     Cervical back: Normal range of motion and neck supple.  Skin:    General: Skin is warm and dry.  Neurological:     General: No focal deficit present.     Mental Status: She is alert and oriented to person, place, and time.  Psychiatric:        Mood and Affect: Mood normal.        Behavior: Behavior normal.        Assessment And Plan:     1. Encounter for general adult medical examination w/o abnormal findings A full exam was performed. Importance of monthly self breast exams was discussed with the patient. PATIENT IS ADVISED TO GET 30-45 MINUTES REGULAR EXERCISE NO LESS THAN FOUR TO FIVE DAYS PER WEEK - BOTH WEIGHTBEARING EXERCISES AND AEROBIC ARE RECOMMENDED.  PATIENT IS ADVISED TO FOLLOW A HEALTHY DIET WITH AT LEAST SIX FRUITS/VEGGIES PER DAY, DECREASE INTAKE OF RED MEAT, AND TO INCREASE FISH INTAKE TO TWO DAYS PER WEEK.  MEATS/FISH SHOULD NOT BE FRIED, BAKED OR BROILED IS PREFERABLE.  IT IS ALSO IMPORTANT TO CUT BACK ON YOUR SUGAR INTAKE. PLEASE AVOID  ANYTHING WITH ADDED SUGAR, CORN SYRUP OR OTHER SWEETENERS. IF YOU MUST USE A SWEETENER, YOU CAN TRY STEVIA. IT IS ALSO IMPORTANT TO AVOID ARTIFICIALLY SWEETENERS AND DIET BEVERAGES. LASTLY, I SUGGEST WEARING SPF 50 SUNSCREEN ON EXPOSED PARTS AND ESPECIALLY WHEN IN THE DIRECT SUNLIGHT FOR AN EXTENDED PERIOD OF TIME.  PLEASE AVOID FAST FOOD RESTAURANTS AND INCREASE YOUR WATER INTAKE.  - CMP14+EGFR - CBC - Lipid panel  2. Vitamin D deficiency disease Comments: I will check  vitamin D level today and supplement as needed. She is currently using sublingual vitamin D3 drops. - Vitamin D (25 hydroxy)  3. Immunization due Comments: I will send rx Shingrix to her local pharmacy.   4. Body mass index (BMI) of 25.0-25.9 in adult Comments: She is encouraged to strive for at least 150 minutes of exercise per week.   5. Influenza vaccination declined  Patient was given opportunity to ask questions. Patient verbalized understanding of the plan and was able to repeat key elements of the plan. All questions were answered to their satisfaction.   I, [name], MD, have reviewed all documentation for this visit. The documentation on 12/17/20 for the exam, diagnosis, procedures, and orders are all accurate and complete.  THE PATIENT IS ENCOURAGED TO PRACTICE SOCIAL DISTANCING DUE TO THE COVID-19 PANDEMIC.

## 2020-12-17 NOTE — Patient Instructions (Signed)
Health Maintenance, Female Adopting a healthy lifestyle and getting preventive care are important in promoting health and wellness. Ask your health care provider about: The right schedule for you to have regular tests and exams. Things you can do on your own to prevent diseases and keep yourself healthy. What should I know about diet, weight, and exercise? Eat a healthy diet  Eat a diet that includes plenty of vegetables, fruits, low-fat dairy products, and lean protein. Do not eat a lot of foods that are high in solid fats, added sugars, or sodium. Maintain a healthy weight Body mass index (BMI) is used to identify weight problems. It estimates body fat based on height and weight. Your health care provider can help determine your BMI and help you achieve or maintain a healthy weight. Get regular exercise Get regular exercise. This is one of the most important things you can do for your health. Most adults should: Exercise for at least 150 minutes each week. The exercise should increase your heart rate and make you sweat (moderate-intensity exercise). Do strengthening exercises at least twice a week. This is in addition to the moderate-intensity exercise. Spend less time sitting. Even light physical activity can be beneficial. Watch cholesterol and blood lipids Have your blood tested for lipids and cholesterol at 55 years of age, then have this test every 5 years. Have your cholesterol levels checked more often if: Your lipid or cholesterol levels are high. You are older than 55 years of age. You are at high risk for heart disease. What should I know about cancer screening? Depending on your health history and family history, you may need to have cancer screening at various ages. This may include screening for: Breast cancer. Cervical cancer. Colorectal cancer. Skin cancer. Lung cancer. What should I know about heart disease, diabetes, and high blood pressure? Blood pressure and heart  disease High blood pressure causes heart disease and increases the risk of stroke. This is more likely to develop in people who have high blood pressure readings, are of African descent, or are overweight. Have your blood pressure checked: Every 3-5 years if you are 18-39 years of age. Every year if you are 40 years old or older. Diabetes Have regular diabetes screenings. This checks your fasting blood sugar level. Have the screening done: Once every three years after age 40 if you are at a normal weight and have a low risk for diabetes. More often and at a younger age if you are overweight or have a high risk for diabetes. What should I know about preventing infection? Hepatitis B If you have a higher risk for hepatitis B, you should be screened for this virus. Talk with your health care provider to find out if you are at risk for hepatitis B infection. Hepatitis C Testing is recommended for: Everyone born from 1945 through 1965. Anyone with known risk factors for hepatitis C. Sexually transmitted infections (STIs) Get screened for STIs, including gonorrhea and chlamydia, if: You are sexually active and are younger than 55 years of age. You are older than 55 years of age and your health care provider tells you that you are at risk for this type of infection. Your sexual activity has changed since you were last screened, and you are at increased risk for chlamydia or gonorrhea. Ask your health care provider if you are at risk. Ask your health care provider about whether you are at high risk for HIV. Your health care provider may recommend a prescription medicine   to help prevent HIV infection. If you choose to take medicine to prevent HIV, you should first get tested for HIV. You should then be tested every 3 months for as long as you are taking the medicine. Pregnancy If you are about to stop having your period (premenopausal) and you may become pregnant, seek counseling before you get  pregnant. Take 400 to 800 micrograms (mcg) of folic acid every day if you become pregnant. Ask for birth control (contraception) if you want to prevent pregnancy. Osteoporosis and menopause Osteoporosis is a disease in which the bones lose minerals and strength with aging. This can result in bone fractures. If you are 65 years old or older, or if you are at risk for osteoporosis and fractures, ask your health care provider if you should: Be screened for bone loss. Take a calcium or vitamin D supplement to lower your risk of fractures. Be given hormone replacement therapy (HRT) to treat symptoms of menopause. Follow these instructions at home: Lifestyle Do not use any products that contain nicotine or tobacco, such as cigarettes, e-cigarettes, and chewing tobacco. If you need help quitting, ask your health care provider. Do not use street drugs. Do not share needles. Ask your health care provider for help if you need support or information about quitting drugs. Alcohol use Do not drink alcohol if: Your health care provider tells you not to drink. You are pregnant, may be pregnant, or are planning to become pregnant. If you drink alcohol: Limit how much you use to 0-1 drink a day. Limit intake if you are breastfeeding. Be aware of how much alcohol is in your drink. In the [place] one drink equals one 12 oz bottle of beer (355 mL), one 5 oz glass of wine (148 mL), or one 1 oz glass of hard liquor (44 mL). General instructions Schedule regular health, dental, and eye exams. Stay current with your vaccines. Tell your health care provider if: You often feel depressed. You have ever been abused or do not feel safe at home. Summary Adopting a healthy lifestyle and getting preventive care are important in promoting health and wellness. Follow your health care provider's instructions about healthy diet, exercising, and getting tested or screened for diseases. Follow your health care provider's  instructions on monitoring your cholesterol and blood pressure. This information is not intended to replace advice given to you by your health care provider. Make sure you discuss any questions you have with your health care provider. Document Revised: 06/01/2020 Document Reviewed: 03/17/2018 Elsevier Patient Education  2022 [employer]  

## 2020-12-18 ENCOUNTER — Encounter: Payer: Self-pay | Admitting: Internal Medicine

## 2020-12-18 LAB — CBC
Hematocrit: 36 % (ref 34.0–46.6)
Hemoglobin: 11.6 g/dL (ref 11.1–15.9)
MCH: 26.7 pg (ref 26.6–33.0)
MCHC: 32.2 g/dL (ref 31.5–35.7)
MCV: 83 fL (ref 79–97)
Platelets: 185 10*3/uL (ref 150–450)
RBC: 4.35 x10E6/uL (ref 3.77–5.28)
RDW: 11.5 % — ABNORMAL LOW (ref 11.7–15.4)
WBC: 3.8 10*3/uL (ref 3.4–10.8)

## 2020-12-18 LAB — CMP14+EGFR
ALT: 9 IU/L (ref 0–32)
AST: 15 IU/L (ref 0–40)
Albumin/Globulin Ratio: 1.6 (ref 1.2–2.2)
Albumin: 4.8 g/dL (ref 3.8–4.9)
Alkaline Phosphatase: 67 IU/L (ref 44–121)
BUN/Creatinine Ratio: 10 (ref 9–23)
BUN: 8 mg/dL (ref 6–24)
Bilirubin Total: 0.5 mg/dL (ref 0.0–1.2)
CO2: 28 mmol/L (ref 20–29)
Calcium: 9.6 mg/dL (ref 8.7–10.2)
Chloride: 102 mmol/L (ref 96–106)
Creatinine, Ser: 0.78 mg/dL (ref 0.57–1.00)
Globulin, Total: 3 g/dL (ref 1.5–4.5)
Glucose: 114 mg/dL — ABNORMAL HIGH (ref 65–99)
Potassium: 4.2 mmol/L (ref 3.5–5.2)
Sodium: 142 mmol/L (ref 134–144)
Total Protein: 7.8 g/dL (ref 6.0–8.5)
eGFR: 90 mL/min/{1.73_m2} (ref >59)

## 2020-12-18 LAB — LIPID PANEL
Chol/HDL Ratio: 3.4 ratio (ref 0.0–4.4)
Cholesterol, Total: 198 mg/dL (ref 100–199)
HDL: 59 mg/dL (ref >39)
LDL Chol Calc (NIH): 116 mg/dL — ABNORMAL HIGH (ref 0–99)
Triglycerides: 128 mg/dL (ref 0–149)
VLDL Cholesterol Cal: 23 mg/dL (ref 5–40)

## 2020-12-18 LAB — VITAMIN D 25 HYDROXY (VIT D DEFICIENCY, FRACTURES): Vit D, 25-Hydroxy: 62.4 ng/mL (ref 30.0–100.0)

## 2021-03-04 ENCOUNTER — Other Ambulatory Visit: Payer: Self-pay | Admitting: Internal Medicine

## 2021-03-04 DIAGNOSIS — Z1231 Encounter for screening mammogram for malignant neoplasm of breast: Secondary | ICD-10-CM

## 2021-04-05 ENCOUNTER — Other Ambulatory Visit: Payer: Self-pay

## 2021-04-05 ENCOUNTER — Ambulatory Visit
Admission: RE | Admit: 2021-04-05 | Discharge: 2021-04-05 | Disposition: A | Discharge status: Home / Self Care | Payer: BC Managed Care – PPO | Source: Ambulatory Visit | Attending: Internal Medicine | Admitting: Internal Medicine

## 2021-04-05 DIAGNOSIS — Z1231 Encounter for screening mammogram for malignant neoplasm of breast: Secondary | ICD-10-CM

## 2021-04-05 LAB — HM MAMMOGRAPHY: HM Mammogram: NORMAL (ref 0–4)

## 2021-12-24 ENCOUNTER — Encounter: Payer: Self-pay | Admitting: Internal Medicine

## 2021-12-24 ENCOUNTER — Ambulatory Visit (INDEPENDENT_AMBULATORY_CARE_PROVIDER_SITE_OTHER): Payer: BC Managed Care – PPO | Admitting: Internal Medicine

## 2021-12-24 VITALS — BP 134/78 | HR 82 | Temp 98.6°F | Ht 66.0 in | Wt 159.8 lb

## 2021-12-24 DIAGNOSIS — Z6825 Body mass index (BMI) 25.0-25.9, adult: Secondary | ICD-10-CM | POA: Diagnosis not present

## 2021-12-24 DIAGNOSIS — Z0000 Encounter for general adult medical examination without abnormal findings: Secondary | ICD-10-CM | POA: Diagnosis not present

## 2021-12-24 DIAGNOSIS — R030 Elevated blood-pressure reading, without diagnosis of hypertension: Secondary | ICD-10-CM

## 2021-12-24 DIAGNOSIS — Z2821 Immunization not carried out because of patient refusal: Secondary | ICD-10-CM | POA: Diagnosis not present

## 2021-12-24 DIAGNOSIS — N950 Postmenopausal bleeding: Secondary | ICD-10-CM | POA: Diagnosis not present

## 2021-12-24 NOTE — Progress Notes (Signed)
Jessica Parks,[name], MD.,have documented all relevant documentation on the behalf of Jessica Greenland, MD,as directed by  Jessica Greenland, MD while in the presence of Jessica Greenland, MD.   Subjective:     [name] , female    DOB: 1965/10/31 , 56 y.o.   MRN: [id number]   Chief Complaint  Patient presents with   Annual Exam    HPI  Patient here for HM. She is followed by [facility] for her GYN care. She was last seen Nov/Dec 2022. She has no specific concerns or complaints at this time.      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/28/2018   Hemorrhoids      Family History  Problem Relation Age of Onset   Hypertension Father    Kidney failure Father    Breast cancer Maternal Aunt        60s   Breast cancer Maternal Aunt        60s   Diabetes Maternal Aunt    Breast cancer Maternal Grandmother        53s   Colon cancer Neg Hx    Rectal cancer Neg Hx      Current Outpatient Medications:    Multiple Vitamins-Minerals (MULTI-VITAMIN GUMMIES PO), Take by mouth., Disp: , Rfl:    polyethylene glycol (MIRALAX / GLYCOLAX) 17 g packet, Take 17 g by mouth daily., Disp: , Rfl:    Psyllium (METAMUCIL FIBER PO), Take by mouth., Disp: , Rfl:    VITAMIN D PO, Take by mouth. drops, Disp: , Rfl:    Allergies  Allergen Reactions   Epinephrine     Coronary vasospasm from regional block injection  Pt states is not allergic   Oxycodone Nausea Only    Dizziness & nausea      The patient states she uses post menopausal status for birth control. Last LMP was No LMP recorded. (Menstrual status: Irregular Periods).. Negative for Dysmenorrhea. Negative for: breast discharge, breast lump(s), breast pain and breast self exam. Associated symptoms include abnormal vaginal bleeding. Pertinent negatives include abnormal  bleeding (hematology), anxiety, decreased libido, depression, difficulty falling sleep, dyspareunia, history of infertility, nocturia, sexual dysfunction, sleep disturbances, urinary incontinence, urinary urgency, vaginal discharge and vaginal itching. Diet regular.The patient states her exercise level is  moderate, she likes to go walking.   . The patient's tobacco use is:  Social History   Tobacco Use  Smoking Status Never  Smokeless Tobacco Never  . She has been exposed to passive smoke. The patient's alcohol use is:  Social History   Substance and Sexual Activity  Alcohol Use Yes   Comment: socially    Review of Systems  Constitutional: Negative.   HENT: Negative.    Eyes: Negative.   Respiratory: Negative.    Cardiovascular: Negative.   Gastrointestinal: Negative.   Endocrine: Negative.   Genitourinary: Negative.        She admits to having some vaginal bleeding. She states she had gone a year without having a cycle. Had some spotting back in June 2023. Denies having any pelvic pain.   Musculoskeletal: Negative.   Skin: Negative.   Allergic/Immunologic: Negative.   Neurological: Negative.   Hematological: Negative.   Psychiatric/Behavioral: Negative.  Today's Vitals   12/24/21 1059  BP: 134/78  Pulse: 82  Temp: 98.6 F (37 C)  Weight: 159 lb 12.8 oz (72.5 kg)  Height: '5\' 6"'  (1.676 m)  PainSc: 0-No pain   Body mass index is 25.79 kg/m.  Wt Readings from Last 3 Encounters:  12/24/21 159 lb 12.8 oz (72.5 kg)  12/17/20 159 lb 12.8 oz (72.5 kg)  11/26/20 161 lb 3.2 oz (73.1 kg)     Objective:  Physical Exam Vitals and nursing note reviewed.  Constitutional:      Appearance: Normal appearance.  HENT:     Head: Normocephalic and atraumatic.     Right Ear: Tympanic membrane, ear canal and external ear normal.     Left Ear: Tympanic membrane, ear canal and external ear normal.     Nose:     Comments: masked    Mouth/Throat:     Comments: Masked  Eyes:      Extraocular Movements: Extraocular movements intact.     Conjunctiva/sclera: Conjunctivae normal.     Pupils: Pupils are equal, round, and reactive to light.  Cardiovascular:     Rate and Rhythm: Normal rate and regular rhythm.     Pulses: Normal pulses.     Heart sounds: Normal heart sounds.  Pulmonary:     Effort: Pulmonary effort is normal.     Breath sounds: Normal breath sounds.  Chest:  Breasts:    Tanner Score is 5.     Right: Normal.     Left: Normal.  Abdominal:     General: Abdomen is flat. Bowel sounds are normal.     Palpations: Abdomen is soft.  Genitourinary:    Comments: deferred Musculoskeletal:        General: Normal range of motion.     Cervical back: Normal range of motion and neck supple.  Skin:    General: Skin is warm and dry.  Neurological:     General: No focal deficit present.     Mental Status: She is alert and oriented to person, place, and time.  Psychiatric:        Mood and Affect: Mood normal.        Behavior: Behavior normal.      Assessment And Plan:     1. Encounter for general adult medical examination w/o abnormal findings Comments: A full exam was performed. Importance of monthly self breast exams was discussed with the patient. PATIENT IS ADVISED TO GET 30-45 MINUTES REGULAR EXERCISE NO LESS THAN FOUR TO FIVE DAYS PER WEEK - BOTH WEIGHTBEARING EXERCISES AND AEROBIC ARE RECOMMENDED.  PATIENT IS ADVISED TO FOLLOW A HEALTHY DIET WITH AT LEAST SIX FRUITS/VEGGIES PER DAY, DECREASE INTAKE OF RED MEAT, AND TO INCREASE FISH INTAKE TO TWO DAYS PER WEEK.  MEATS/FISH SHOULD NOT BE FRIED, BAKED OR BROILED IS PREFERABLE.  IT IS ALSO IMPORTANT TO CUT BACK ON YOUR SUGAR INTAKE. PLEASE AVOID ANYTHING WITH ADDED SUGAR, CORN SYRUP OR OTHER SWEETENERS. IF YOU MUST USE A SWEETENER, YOU CAN TRY STEVIA. IT IS ALSO IMPORTANT TO AVOID ARTIFICIALLY SWEETENERS AND DIET BEVERAGES. LASTLY, I SUGGEST WEARING SPF 50 SUNSCREEN ON EXPOSED PARTS AND ESPECIALLY WHEN IN THE  DIRECT SUNLIGHT FOR AN EXTENDED PERIOD OF TIME.  PLEASE AVOID FAST FOOD RESTAURANTS AND INCREASE YOUR WATER INTAKE. - CBC - CMP14+EGFR - Lipid panel - Ambulatory referral to Gynecology - TSH - Hemoglobin A1c  2. Postmenopausal bleeding Comments: I will refer her to Dr. Garwin Brothers as requested. Workup will likely include  pelvic u/s and EMBx. - Ambulatory referral to Gynecology  3. Elevated blood pressure reading Comments: She is encouraged to decrease her intake of salty foods/processed meats including bacon, sausages and deli meats. She agrees to f/u with nurse visit.   4. BMI 25.0-25.9,adult Comments: She is encouraged to continue with her regular exercise regimen, aiming for at least 150 minutes of exercise per week.   5. Herpes zoster vaccination declined  6. Influenza vaccination declined  Patient was given opportunity to ask questions. Patient verbalized understanding of the plan and was able to repeat key elements of the plan. All questions were answered to their satisfaction.   I, Jessica Greenland, MD, have reviewed all documentation for this visit. The documentation on 12/24/21 for the exam, diagnosis, procedures, and orders are all accurate and complete.   THE PATIENT IS ENCOURAGED TO PRACTICE SOCIAL DISTANCING DUE TO THE COVID-19 PANDEMIC.

## 2021-12-24 NOTE — Patient Instructions (Signed)

## 2021-12-25 LAB — CBC
Hematocrit: 35.4 % (ref 34.0–46.6)
Hemoglobin: 11.6 g/dL (ref 11.1–15.9)
MCH: 27.4 pg (ref 26.6–33.0)
MCHC: 32.8 g/dL (ref 31.5–35.7)
MCV: 84 fL (ref 79–97)
Platelets: 186 10*3/uL (ref 150–450)
RBC: 4.23 x10E6/uL (ref 3.77–5.28)
RDW: 11.4 % — ABNORMAL LOW (ref 11.7–15.4)
WBC: 3.4 10*3/uL (ref 3.4–10.8)

## 2021-12-25 LAB — CMP14+EGFR
ALT: 11 IU/L (ref 0–32)
AST: 15 IU/L (ref 0–40)
Albumin/Globulin Ratio: 1.8 (ref 1.2–2.2)
Albumin: 4.8 g/dL (ref 3.8–4.9)
Alkaline Phosphatase: 71 IU/L (ref 44–121)
BUN/Creatinine Ratio: 13 (ref 9–23)
BUN: 10 mg/dL (ref 6–24)
Bilirubin Total: 0.6 mg/dL (ref 0.0–1.2)
CO2: 24 mmol/L (ref 20–29)
Calcium: 9.6 mg/dL (ref 8.7–10.2)
Chloride: 101 mmol/L (ref 96–106)
Creatinine, Ser: 0.76 mg/dL (ref 0.57–1.00)
Globulin, Total: 2.6 g/dL (ref 1.5–4.5)
Glucose: 122 mg/dL — ABNORMAL HIGH (ref 70–99)
Potassium: 3.7 mmol/L (ref 3.5–5.2)
Sodium: 140 mmol/L (ref 134–144)
Total Protein: 7.4 g/dL (ref 6.0–8.5)
eGFR: 92 mL/min/{1.73_m2} (ref >59)

## 2021-12-25 LAB — LIPID PANEL
Chol/HDL Ratio: 3.3 ratio (ref 0.0–4.4)
Cholesterol, Total: 170 mg/dL (ref 100–199)
HDL: 51 mg/dL (ref >39)
LDL Chol Calc (NIH): 99 mg/dL (ref 0–99)
Triglycerides: 114 mg/dL (ref 0–149)
VLDL Cholesterol Cal: 20 mg/dL (ref 5–40)

## 2021-12-25 LAB — TSH: TSH: 1.77 u[IU]/mL (ref 0.450–4.500)

## 2021-12-25 LAB — HEMOGLOBIN A1C
Est. average glucose Bld gHb Est-mCnc: 108 mg/dL
Hgb A1c MFr Bld: 5.4 % (ref 4.8–5.6)

## 2021-12-25 LAB — HM PAP SMEAR

## 2021-12-31 LAB — SPECIMEN STATUS REPORT

## 2021-12-31 LAB — FOLLICLE STIMULATING HORMONE: FSH: 60 m[IU]/mL

## 2022-01-03 ENCOUNTER — Encounter: Payer: Self-pay | Admitting: Internal Medicine

## 2022-01-04 ENCOUNTER — Encounter: Payer: Self-pay | Admitting: Internal Medicine

## 2022-01-07 ENCOUNTER — Encounter: Payer: Self-pay | Admitting: Internal Medicine

## 2022-02-21 ENCOUNTER — Other Ambulatory Visit: Payer: Self-pay | Admitting: Internal Medicine

## 2022-02-21 DIAGNOSIS — Z1231 Encounter for screening mammogram for malignant neoplasm of breast: Secondary | ICD-10-CM

## 2022-03-18 ENCOUNTER — Encounter: Payer: Self-pay | Admitting: Internal Medicine

## 2022-03-18 ENCOUNTER — Ambulatory Visit: Payer: BC Managed Care – PPO | Admitting: Internal Medicine

## 2022-03-18 VITALS — BP 160/90 | HR 82 | Temp 98.1°F | Ht 66.0 in | Wt 161.2 lb

## 2022-03-18 DIAGNOSIS — Z23 Encounter for immunization: Secondary | ICD-10-CM | POA: Diagnosis not present

## 2022-03-18 DIAGNOSIS — Z6826 Body mass index (BMI) 26.0-26.9, adult: Secondary | ICD-10-CM

## 2022-03-18 DIAGNOSIS — I10 Essential (primary) hypertension: Secondary | ICD-10-CM | POA: Diagnosis not present

## 2022-03-18 DIAGNOSIS — R030 Elevated blood-pressure reading, without diagnosis of hypertension: Secondary | ICD-10-CM

## 2022-03-18 MED ORDER — AMLODIPINE BESYLATE 2.5 MG PO TABS: 2.5000 mg | ORAL_TABLET | Freq: Every day | ORAL | 11 refills | Status: DC

## 2022-03-18 NOTE — Progress Notes (Signed)
Jessica Parks,[name], Parks.,have documented all relevant documentation on the behalf of Jessica Greenland, Parks,as directed by  Jessica Greenland, Parks while in the presence of Jessica Greenland, Parks.    Subjective:     [name]    DOB: 1965-12-15 , 56 y.o.   MRN: [id number]   Chief Complaint  Patient presents with   Hypertension    HPI  Patient presents today for a follow up on her elevated bp check. She does admit to positive family history for HTN. She denies having any headaches, chest pain and shortness of breath. She states she is feeling stressed today as well.   BP Readings from Last 3 Encounters: 03/18/22 : (!) 142/70 12/24/21 : 134/78 12/17/20 : 120/62       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/28/2018   Hemorrhoids      Family History  Problem Relation Age of Onset   Hypertension Father    Kidney failure Father    Breast cancer Maternal Aunt        60s   Breast cancer Maternal Aunt        60s   Diabetes Maternal Aunt    Breast cancer Maternal Grandmother        68s   Colon cancer Neg Hx    Rectal cancer Neg Hx      Current Outpatient Medications:    amLODipine (NORVASC) 2.5 MG tablet, Take 1 tablet (2.5 mg total) by mouth daily., Disp: 30 tablet, Rfl: 11   Multiple Vitamins-Minerals (MULTI-VITAMIN GUMMIES PO), Take by mouth., Disp: , Rfl:    polyethylene glycol (MIRALAX / GLYCOLAX) 17 g packet, Take 17 g by mouth daily., Disp: , Rfl:    Psyllium (METAMUCIL FIBER PO), Take by mouth., Disp: , Rfl:    VITAMIN D PO, Take by mouth. drops, Disp: , Rfl:    Allergies  Allergen Reactions   Epinephrine     Coronary vasospasm from regional block injection  Pt states is not allergic   Oxycodone Nausea Only    Dizziness & nausea     Review of Systems  Constitutional: Negative.   Eyes:  Negative.   Respiratory: Negative.    Cardiovascular: Negative.   Musculoskeletal: Negative.   Skin: Negative.   Neurological: Negative.   Psychiatric/Behavioral: Negative.       Today's Vitals   03/18/22 1206 03/18/22 1234  BP: (!) 142/70 (!) 160/90  Pulse: 82   Temp: 98.1 F (36.7 C)   Weight: 161 lb 3.2 oz (73.1 kg)   Height: '5\' 6"'$  (1.676 m)   PainSc: 0-No pain    Body mass index is 26.02 kg/m.  Wt Readings from Last 3 Encounters:  03/18/22 161 lb 3.2 oz (73.1 kg)  12/24/21 159 lb 12.8 oz (72.5 kg)  12/17/20 159 lb 12.8 oz (72.5 kg)     Objective:  Physical Exam Vitals and nursing note reviewed.  Constitutional:      Appearance: Normal appearance.  HENT:     Head: Normocephalic and atraumatic.     Nose:     Comments: Masked     Mouth/Throat:     Comments: Masked  Eyes:     Extraocular Movements: Extraocular movements intact.  Cardiovascular:  Rate and Rhythm: Normal rate and regular rhythm.     Heart sounds: Normal heart sounds.  Pulmonary:     Effort: Pulmonary effort is normal.     Breath sounds: Normal breath sounds.  Skin:    General: Skin is warm.  Neurological:     General: No focal deficit present.     Mental Status: She is alert.  Psychiatric:        Mood and Affect: Mood normal.        Behavior: Behavior normal.      Assessment And Plan:     1. Essential hypertension, benign Comments: I will start her on amlodipine 2.'5mg'$  nightly. She agrees to f/u in 3-5 weeks for re-evaluation. If needed, I will titrate dose. Reminded to c/w low sodium diet. - amLODipine (NORVASC) 2.5 MG tablet; Take 1 tablet (2.5 mg total) by mouth daily.  Dispense: 30 tablet; Refill: 11  2. BMI 26.0-26.9,adult Comments: She is encouraged to continue with her regular exercise regimen, aiming for at least 150 minutes of exercise per week.  3. Immunization due - Varicella-zoster vaccine IM   Patient was given opportunity to ask questions. Patient verbalized  understanding of the plan and was able to repeat key elements of the plan. All questions were answered to their satisfaction.   I, Jessica Greenland, Parks, have reviewed all documentation for this visit. The documentation on 03/22/22 for the exam, diagnosis, procedures, and orders are all accurate and complete.   IF YOU HAVE BEEN REFERRED TO A SPECIALIST, IT MAY TAKE 1-2 WEEKS TO SCHEDULE/PROCESS THE REFERRAL. IF YOU HAVE NOT HEARD FROM US/SPECIALIST IN TWO WEEKS, PLEASE GIVE [place] A CALL AT [phone].   THE PATIENT IS ENCOURAGED TO PRACTICE SOCIAL DISTANCING DUE TO THE COVID-19 PANDEMIC.

## 2022-03-18 NOTE — Patient Instructions (Signed)

## 2022-04-08 ENCOUNTER — Ambulatory Visit: Payer: BC Managed Care – PPO

## 2022-04-08 VITALS — BP 128/70 | HR 72 | Temp 98.3°F

## 2022-04-08 DIAGNOSIS — I10 Essential (primary) hypertension: Secondary | ICD-10-CM

## 2022-04-08 NOTE — Progress Notes (Signed)
Patient presents today for BPC. She is currently taking Amlodipine 2.5.   BP Readings from Last 3 Encounters:  04/08/22 128/70  03/18/22 (!) 160/90  12/24/21 134/78   Patient will continue with current medications at this time. She will follow up with the provider as scheduled.

## 2022-04-11 ENCOUNTER — Encounter: Payer: Self-pay | Admitting: Internal Medicine

## 2022-04-16 ENCOUNTER — Ambulatory Visit
Admission: RE | Admit: 2022-04-16 | Discharge: 2022-04-16 | Disposition: A | Discharge status: Home / Self Care | Payer: BC Managed Care – PPO | Source: Ambulatory Visit | Attending: Internal Medicine | Admitting: Internal Medicine

## 2022-04-16 DIAGNOSIS — Z1231 Encounter for screening mammogram for malignant neoplasm of breast: Secondary | ICD-10-CM

## 2022-05-22 ENCOUNTER — Encounter: Payer: Self-pay | Admitting: Internal Medicine

## 2022-06-17 ENCOUNTER — Ambulatory Visit: Payer: BC Managed Care – PPO | Admitting: Internal Medicine

## 2022-06-17 ENCOUNTER — Encounter: Payer: Self-pay | Admitting: Internal Medicine

## 2022-06-17 VITALS — BP 126/82 | Temp 98.7°F | Ht 66.0 in | Wt 159.4 lb

## 2022-06-17 DIAGNOSIS — I10 Essential (primary) hypertension: Secondary | ICD-10-CM | POA: Diagnosis not present

## 2022-06-17 DIAGNOSIS — Z6825 Body mass index (BMI) 25.0-25.9, adult: Secondary | ICD-10-CM

## 2022-06-17 NOTE — Progress Notes (Unsigned)
I,Victoria T Hamilton,acting as a scribe for [name], MD.,have documented all relevant documentation on the behalf of [name], MD,as directed by  [name], MD while in the presence of [name], MD.    Subjective:     [name] , female    DOB: [birth year] , 57 y.o.   MRN: [id number]   Chief Complaint  Patient presents with   Hypertension    HPI  Patient presents today for a follow up on her elevated bp check. She does admit to positive family history for HTN. She denies having any headaches, chest pain and shortness of breath. She states she is feeling stressed today as well.  She reports highest bp, taken a week ago: 140/90. She has had fluctuating readings. She reports taking magnesium at night.  She also reports bone graft procedure on Monday.    Hypertension     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/28/2018   Hemorrhoids      Family History  Problem Relation Age of Onset   Hypertension Father    Kidney failure Father    Breast cancer Maternal Aunt        60s   Breast cancer Maternal Aunt        60s   Diabetes Maternal Aunt    Breast cancer Maternal Grandmother        36s   Colon cancer Neg Hx    Rectal cancer Neg Hx      Current Outpatient Medications:    Multiple Vitamins-Minerals (MULTI-VITAMIN GUMMIES PO), Take by mouth., Disp: , Rfl:    polyethylene glycol (MIRALAX / GLYCOLAX) 17 g packet, Take 17 g by mouth daily., Disp: , Rfl:    Psyllium (METAMUCIL FIBER PO), Take by mouth., Disp: , Rfl:    VITAMIN D PO, Take by mouth. drops, Disp: , Rfl:    amLODipine (NORVASC) 2.5 MG tablet, Take 1 tablet (2.5 mg total) by mouth daily. (Patient not taking: Reported on 06/17/2022), Disp: 30 tablet, Rfl: 11   Allergies  Allergen Reactions   Epinephrine     Coronary vasospasm from regional block injection  Pt  states is not allergic   Oxycodone Nausea Only    Dizziness & nausea     Review of Systems  Constitutional: Negative.   Respiratory: Negative.    Cardiovascular: Negative.   Gastrointestinal: Negative.   Skin: Negative.   Neurological: Negative.   Psychiatric/Behavioral: Negative.       Today's Vitals   06/17/22 1143  BP: 126/82  Temp: 98.7 F (37.1 C)  SpO2: 98%  Weight: 159 lb 6.4 oz (72.3 kg)  Height: '5\' 6"'$  (1.676 m)   Body mass index is 25.73 kg/m.  Wt Readings from Last 3 Encounters:  06/17/22 159 lb 6.4 oz (72.3 kg)  03/18/22 161 lb 3.2 oz (73.1 kg)  12/24/21 159 lb 12.8 oz (72.5 kg)    Objective:  Physical Exam Vitals and nursing note reviewed.  Constitutional:      Appearance: Normal appearance.  HENT:     Head: Normocephalic and atraumatic.     Nose:     Comments: Masked     Mouth/Throat:     Comments: Masked  Eyes:     Extraocular Movements: Extraocular movements intact.  Cardiovascular:  Rate and Rhythm: Normal rate and regular rhythm.     Heart sounds: Normal heart sounds.  Pulmonary:     Effort: Pulmonary effort is normal.     Breath sounds: Normal breath sounds.  Musculoskeletal:     Cervical back: Normal range of motion.  Skin:    General: Skin is warm.  Neurological:     General: No focal deficit present.     Mental Status: She is alert.  Psychiatric:        Mood and Affect: Mood normal.        Behavior: Behavior normal.         Assessment And Plan:     1. Essential hypertension, benign  2. Vitamin D deficiency disease  3. Need for shingles vaccine  4. BMI 25.0-25.9,adult    Patient was given opportunity to ask questions. Patient verbalized understanding of the plan and was able to repeat key elements of the plan. All questions were answered to their satisfaction.   I, [name], MD, have reviewed all documentation for this visit. The documentation on 06/17/22 for the exam, diagnosis, procedures, and orders are all  accurate and complete.   IF YOU HAVE BEEN REFERRED TO A SPECIALIST, IT MAY TAKE 1-2 WEEKS TO SCHEDULE/PROCESS THE REFERRAL. IF YOU HAVE NOT HEARD FROM US/SPECIALIST IN TWO WEEKS, PLEASE GIVE [place] A CALL AT [phone].   THE PATIENT IS ENCOURAGED TO PRACTICE SOCIAL DISTANCING DUE TO THE COVID-19 PANDEMIC.

## 2022-06-17 NOTE — Patient Instructions (Signed)
Hypertension, Adult ?Hypertension is another name for high blood pressure. High blood pressure forces your heart to work harder to pump blood. This can cause problems over time. ?There are two numbers in a blood pressure reading. There is a top number (systolic) over a bottom number (diastolic). It is best to have a blood pressure that is below 120/80. ?What are the causes? ?The cause of this condition is not known. Some other conditions can lead to high blood pressure. ?What increases the risk? ?Some lifestyle factors can make you more likely to develop high blood pressure: ?Smoking. ?Not getting enough exercise or physical activity. ?Being overweight. ?Having too much fat, sugar, calories, or salt (sodium) in your diet. ?Drinking too much alcohol. ?Other risk factors include: ?Having any of these conditions: ?Heart disease. ?Diabetes. ?High cholesterol. ?Kidney disease. ?Obstructive sleep apnea. ?Having a family history of high blood pressure and high cholesterol. ?Age. The risk increases with age. ?Stress. ?What are the signs or symptoms? ?High blood pressure may not cause symptoms. Very high blood pressure (hypertensive crisis) may cause: ?Headache. ?Fast or uneven heartbeats (palpitations). ?Shortness of breath. ?Nosebleed. ?Vomiting or feeling like you may vomit (nauseous). ?Changes in how you see. ?Very bad chest pain. ?Feeling dizzy. ?Seizures. ?How is this treated? ?This condition is treated by making healthy lifestyle changes, such as: ?Eating healthy foods. ?Exercising more. ?Drinking less alcohol. ?Your doctor may prescribe medicine if lifestyle changes do not help enough and if: ?Your top number is above 130. ?Your bottom number is above 80. ?Your personal target blood pressure may vary. ?Follow these instructions at home: ?Eating and drinking ? ?If told, follow the DASH eating plan. To follow this plan: ?Fill one half of your plate at each meal with fruits and vegetables. ?Fill one fourth of your plate  at each meal with whole grains. Whole grains include whole-wheat pasta, brown rice, and whole-grain bread. ?Eat or drink low-fat dairy products, such as skim milk or low-fat yogurt. ?Fill one fourth of your plate at each meal with low-fat (lean) proteins. Low-fat proteins include fish, chicken without skin, eggs, beans, and tofu. ?Avoid fatty meat, cured and processed meat, or chicken with skin. ?Avoid pre-made or processed food. ?Limit the amount of salt in your diet to less than 1,500 mg each day. ?Do not drink alcohol if: ?Your doctor tells you not to drink. ?You are pregnant, may be pregnant, or are planning to become pregnant. ?If you drink alcohol: ?Limit how much you have to: ?0-1 drink a day for women. ?0-2 drinks a day for men. ?Know how much alcohol is in your drink. In the [place] one drink equals one 12 oz bottle of beer (355 mL), one 5 oz glass of wine (148 mL), or one 1? oz glass of hard liquor (44 mL). ?Lifestyle ? ?Work with your doctor to stay at a healthy weight or to lose weight. Ask your doctor what the best weight is for you. ?Get at least 30 minutes of exercise that causes your heart to beat faster (aerobic exercise) most days of the week. This may include walking, swimming, or biking. ?Get at least 30 minutes of exercise that strengthens your muscles (resistance exercise) at least 3 days a week. This may include lifting weights or doing Pilates. ?Do not smoke or use any products that contain nicotine or tobacco. If you need help quitting, ask your doctor. ?Check your blood pressure at home as told by your doctor. ?Keep all follow-up visits. ?Medicines ?Take over-the-counter and prescription medicines   only as told by your doctor. Follow directions carefully. ?Do not skip doses of blood pressure medicine. The medicine does not work as well if you skip doses. Skipping doses also puts you at risk for problems. ?Ask your doctor about side effects or reactions to medicines that you should watch  for. ?Contact a doctor if: ?You think you are having a reaction to the medicine you are taking. ?You have headaches that keep coming back. ?You feel dizzy. ?You have swelling in your ankles. ?You have trouble with your vision. ?Get help right away if: ?You get a very bad headache. ?You start to feel mixed up (confused). ?You feel weak or numb. ?You feel faint. ?You have very bad pain in your: ?Chest. ?Belly (abdomen). ?You vomit more than once. ?You have trouble breathing. ?These symptoms may be an emergency. Get help right away. Call 911. ?Do not wait to see if the symptoms will go away. ?Do not drive yourself to the hospital. ?Summary ?Hypertension is another name for high blood pressure. ?High blood pressure forces your heart to work harder to pump blood. ?For most people, a normal blood pressure is less than 120/80. ?Making healthy choices can help lower blood pressure. If your blood pressure does not get lower with healthy choices, you may need to take medicine. ?This information is not intended to replace advice given to you by your health care provider. Make sure you discuss any questions you have with your health care provider. ?Document Revised: 01/10/2021 Document Reviewed: 01/10/2021 ?Elsevier Patient Education ? 2023 [employer] ? ?

## 2022-06-23 ENCOUNTER — Encounter: Payer: Self-pay | Admitting: Internal Medicine

## 2022-07-15 ENCOUNTER — Ambulatory Visit: Payer: BC Managed Care – PPO

## 2022-10-21 ENCOUNTER — Ambulatory Visit: Payer: Self-pay | Admitting: Internal Medicine

## 2023-01-07 ENCOUNTER — Encounter: Payer: Self-pay | Admitting: Internal Medicine

## 2023-01-07 ENCOUNTER — Ambulatory Visit (INDEPENDENT_AMBULATORY_CARE_PROVIDER_SITE_OTHER): Payer: BC Managed Care – PPO | Admitting: Internal Medicine

## 2023-01-07 VITALS — BP 122/78 | HR 77 | Temp 98.6°F | Ht 66.0 in | Wt 159.6 lb

## 2023-01-07 DIAGNOSIS — E049 Nontoxic goiter, unspecified: Secondary | ICD-10-CM | POA: Diagnosis not present

## 2023-01-07 DIAGNOSIS — R030 Elevated blood-pressure reading, without diagnosis of hypertension: Secondary | ICD-10-CM | POA: Insufficient documentation

## 2023-01-07 DIAGNOSIS — E559 Vitamin D deficiency, unspecified: Secondary | ICD-10-CM | POA: Insufficient documentation

## 2023-01-07 DIAGNOSIS — Z0000 Encounter for general adult medical examination without abnormal findings: Secondary | ICD-10-CM | POA: Insufficient documentation

## 2023-01-07 DIAGNOSIS — R82998 Other abnormal findings in urine: Secondary | ICD-10-CM | POA: Diagnosis not present

## 2023-01-07 LAB — POCT URINALYSIS DIPSTICK
Bilirubin, UA: NEGATIVE
Glucose, UA: NEGATIVE
Ketones, UA: NEGATIVE
Nitrite, UA: NEGATIVE
Protein, UA: NEGATIVE
Spec Grav, UA: 1.02 (ref 1.010–1.025)
Urobilinogen, UA: 0.2 U/dL
pH, UA: 8 (ref 5.0–8.0)

## 2023-01-07 NOTE — Progress Notes (Signed)
I,Jessica Parks, CMA,acting as a Neurosurgeon for Jessica Aliment, MD.,have documented all relevant documentation on the behalf of Jessica Aliment, MD,as directed by  Jessica Aliment, MD while in the presence of Jessica Aliment, MD.  Subjective:    [name] , female    DOB: March 08, 1966 , 57 y.o.   MRN: [id number]  Chief Complaint  Patient presents with   Annual Exam    HPI  Patient here for annual exam. She is followed by Dr. Cherly Parks for her GYN exams, last visit 12/29/22.  She has no specific concerns or complaints at this time.      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/28/2018   Hemorrhoids      Family History  Problem Relation Age of Onset   Hypertension Father    Kidney failure Father    Breast cancer Maternal Aunt        55s   Breast cancer Maternal Aunt        60s   Diabetes Maternal Aunt    Breast cancer Maternal Grandmother        54s   Colon cancer Neg Hx    Rectal cancer Neg Hx      Current Outpatient Medications:    Multiple Vitamins-Minerals (MULTI-VITAMIN GUMMIES PO), Take by mouth., Disp: , Rfl:    polyethylene glycol (MIRALAX / GLYCOLAX) 17 g packet, Take 17 g by mouth daily., Disp: , Rfl:    Psyllium (METAMUCIL FIBER PO), Take by mouth., Disp: , Rfl:    VITAMIN D PO, Take by mouth. drops, Disp: , Rfl:    amLODipine (NORVASC) 2.5 MG tablet, Take 1 tablet (2.5 mg total) by mouth daily. (Patient not taking: Reported on 06/17/2022), Disp: 30 tablet, Rfl: 11   Allergies  Allergen Reactions   Epinephrine     Coronary vasospasm from regional block injection  Pt states is not allergic   Oxycodone Nausea Only    Dizziness & nausea      The patient states she uses none for birth control. Patient's last menstrual period was 09/05/2021 (exact date).. Negative for Dysmenorrhea. Negative for: breast discharge, breast lump(s), breast  pain and breast self exam. Associated symptoms include abnormal vaginal bleeding. Pertinent negatives include abnormal bleeding (hematology), anxiety, decreased libido, depression, difficulty falling sleep, dyspareunia, history of infertility, nocturia, sexual dysfunction, sleep disturbances, urinary incontinence, urinary urgency, vaginal discharge and vaginal itching. Diet regular.The patient states her exercise level is  moderate.  . The patient's tobacco use is:  Social History   Tobacco Use  Smoking Status Never  Smokeless Tobacco Never  . She has been exposed to passive smoke. The patient's alcohol use is:  Social History   Substance and Sexual Activity  Alcohol Use Yes   Comment: socially   Review of Systems  Constitutional: Negative.   HENT: Negative.    Eyes: Negative.   Respiratory: Negative.    Cardiovascular: Negative.   Gastrointestinal: Negative.   Endocrine: Negative.   Genitourinary: Negative.   Musculoskeletal: Negative.   Skin: Negative.   Allergic/Immunologic: Negative.   Neurological: Negative.   Hematological: Negative.   Psychiatric/Behavioral: Negative.       Today's Vitals   01/07/23 1507  BP: 122/78  Pulse: 77  Temp: 98.6 F (37 C)  SpO2: 98%  Weight: 159  lb 9.6 oz (72.4 kg)  Height: 5\' 6"  (1.676 m)   Body mass index is 25.76 kg/m.  Wt Readings from Last 3 Encounters:  01/07/23 159 lb 9.6 oz (72.4 kg)  06/17/22 159 lb 6.4 oz (72.3 kg)  03/18/22 161 lb 3.2 oz (73.1 kg)     BP Readings from Last 3 Encounters:  01/07/23 122/78  06/17/22 126/82  04/08/22 128/70     Objective:  Physical Exam Vitals and nursing note reviewed.  Constitutional:      Appearance: Normal appearance.  HENT:     Head: Normocephalic and atraumatic.     Right Ear: Tympanic membrane, ear canal and external ear normal.     Left Ear: Tympanic membrane, ear canal and external ear normal.     Nose: Nose normal.     Mouth/Throat:     Mouth: Mucous membranes are  moist.     Pharynx: Oropharynx is clear.  Eyes:     Extraocular Movements: Extraocular movements intact.     Conjunctiva/sclera: Conjunctivae normal.     Pupils: Pupils are equal, round, and reactive to light.  Neck:     Thyroid: Thyromegaly present.  Cardiovascular:     Rate and Rhythm: Normal rate and regular rhythm.     Pulses: Normal pulses.     Heart sounds: Normal heart sounds.  Pulmonary:     Effort: Pulmonary effort is normal.     Breath sounds: Normal breath sounds.  Chest:  Breasts:    Tanner Score is 5.     Right: Inverted nipple present.     Left: Inverted nipple present.  Abdominal:     General: Abdomen is flat. Bowel sounds are normal.     Palpations: Abdomen is soft.  Genitourinary:    Comments: deferred Musculoskeletal:        General: Normal range of motion.     Cervical back: Normal range of motion and neck supple.  Skin:    General: Skin is warm and dry.  Neurological:     General: No focal deficit present.     Mental Status: She is alert and oriented to person, place, and time.  Psychiatric:        Mood and Affect: Mood normal.        Behavior: Behavior normal.         Assessment And Plan:     Encounter for general adult medical examination w/o abnormal findings Assessment & Plan: A full exam was performed.  Importance of monthly self breast exams was discussed with the patient.  I will request notes/pap smear results from her GYN.  She is advised to get 30-45 minutes of regular exercise, no less than four to five days per week. Both weight-bearing and aerobic exercises are recommended.  She is advised to follow a healthy diet with at least six fruits/veggies per day, decrease intake of red meat and other saturated fats and to increase fish intake to twice weekly.  Meats/fish should not be fried -- baked, boiled or broiled is preferable. It is also important to cut back on your sugar intake.  Be sure to read labels - try to avoid anything with added  sugar, high fructose corn syrup or other sweeteners.  If you must use a sweetener, you can try stevia or monkfruit.  It is also important to avoid artificially sweetened foods/beverages and diet drinks. Lastly, wear SPF 50 sunscreen on exposed skin and when in direct sunlight for an extended period of time.  Be sure to avoid fast food restaurants and aim for at least 60 ounces of water daily.      Orders: -     CBC -     CMP14+EGFR -     Lipid panel  Elevated blood pressure reading Assessment & Plan: Her blood pressure has improved with regular exercise.  She was congratulated for the improvement of her condition. Encouraged to continue to follow low sodium diet.   Orders: -     POCT urinalysis dipstick -     Microalbumin / creatinine urine ratio  Vitamin D deficiency disease Assessment & Plan: I will check vitamin D level per her request. I will supplement as needed .  Orders: -     VITAMIN D 25 Hydroxy (Vit-D Deficiency, Fractures)  Goiter Assessment & Plan: I will check thyroid labs as below. She agrees to thyroid ultrasound for further evaluation.   Orders: -     TSH + free T4 -     US THYROID; Future  Leukocytes in urine Assessment & Plan: I will send urine for culture.   Orders: -     Urine Culture     Return for 1 year HM. Patient was given opportunity to ask questions. Patient verbalized understanding of the plan and was able to repeat key elements of the plan. All questions were answered to their satisfaction.     I, Jessica Aliment, MD, have reviewed all documentation for this visit. The documentation on 01/07/23 for the exam, diagnosis, procedures, and orders are all accurate and complete.

## 2023-01-07 NOTE — Assessment & Plan Note (Addendum)
A full exam was performed.  Importance of monthly self breast exams was discussed with the patient.  I will request notes/pap smear results from her GYN.  She is advised to get 30-45 minutes of regular exercise, no less than four to five days per week. Both weight-bearing and aerobic exercises are recommended.  She is advised to follow a healthy diet with at least six fruits/veggies per day, decrease intake of red meat and other saturated fats and to increase fish intake to twice weekly.  Meats/fish should not be fried -- baked, boiled or broiled is preferable. It is also important to cut back on your sugar intake.  Be sure to read labels - try to avoid anything with added sugar, high fructose corn syrup or other sweeteners.  If you must use a sweetener, you can try stevia or monkfruit.  It is also important to avoid artificially sweetened foods/beverages and diet drinks. Lastly, wear SPF 50 sunscreen on exposed skin and when in direct sunlight for an extended period of time.  Be sure to avoid fast food restaurants and aim for at least 60 ounces of water daily.

## 2023-01-07 NOTE — Assessment & Plan Note (Signed)
Her blood pressure has improved with regular exercise.  She was congratulated for the improvement of her condition. Encouraged to continue to follow low sodium diet.

## 2023-01-07 NOTE — Assessment & Plan Note (Signed)
I will send urine for culture

## 2023-01-07 NOTE — Patient Instructions (Signed)

## 2023-01-07 NOTE — Assessment & Plan Note (Signed)
I will check thyroid labs as below. She agrees to thyroid ultrasound for further evaluation.

## 2023-01-07 NOTE — Assessment & Plan Note (Signed)
I will check vitamin D level per her request. I will supplement as needed .

## 2023-01-08 LAB — LIPID PANEL
Chol/HDL Ratio: 3.4 {ratio} (ref 0.0–4.4)
Cholesterol, Total: 187 mg/dL (ref 100–199)
HDL: 55 mg/dL (ref >39)
LDL Chol Calc (NIH): 96 mg/dL (ref 0–99)
Triglycerides: 211 mg/dL — ABNORMAL HIGH (ref 0–149)
VLDL Cholesterol Cal: 36 mg/dL (ref 5–40)

## 2023-01-08 LAB — CMP14+EGFR
ALT: 12 [IU]/L (ref 0–32)
AST: 20 [IU]/L (ref 0–40)
Albumin: 4.4 g/dL (ref 3.8–4.9)
Alkaline Phosphatase: 82 [IU]/L (ref 44–121)
BUN/Creatinine Ratio: 9 (ref 9–23)
BUN: 9 mg/dL (ref 6–24)
Bilirubin Total: 0.4 mg/dL (ref 0.0–1.2)
CO2: 26 mmol/L (ref 20–29)
Calcium: 9.7 mg/dL (ref 8.7–10.2)
Chloride: 103 mmol/L (ref 96–106)
Creatinine, Ser: 0.97 mg/dL (ref 0.57–1.00)
Globulin, Total: 2.7 g/dL (ref 1.5–4.5)
Glucose: 133 mg/dL — ABNORMAL HIGH (ref 70–99)
Potassium: 3.8 mmol/L (ref 3.5–5.2)
Sodium: 139 mmol/L (ref 134–144)
Total Protein: 7.1 g/dL (ref 6.0–8.5)
eGFR: 69 mL/min/{1.73_m2} (ref >59)

## 2023-01-08 LAB — CBC
Hematocrit: 35.9 % (ref 34.0–46.6)
Hemoglobin: 10.9 g/dL — ABNORMAL LOW (ref 11.1–15.9)
MCH: 26.5 pg — ABNORMAL LOW (ref 26.6–33.0)
MCHC: 30.4 g/dL — ABNORMAL LOW (ref 31.5–35.7)
MCV: 87 fL (ref 79–97)
Platelets: 260 10*3/uL (ref 150–450)
RBC: 4.12 x10E6/uL (ref 3.77–5.28)
RDW: 11.5 % — ABNORMAL LOW (ref 11.7–15.4)
WBC: 4.2 10*3/uL (ref 3.4–10.8)

## 2023-01-08 LAB — TSH+FREE T4
Free T4: 1.26 ng/dL (ref 0.82–1.77)
TSH: 1.36 u[IU]/mL (ref 0.450–4.500)

## 2023-01-08 LAB — VITAMIN D 25 HYDROXY (VIT D DEFICIENCY, FRACTURES): Vit D, 25-Hydroxy: 37.5 ng/mL (ref 30.0–100.0)

## 2023-01-08 LAB — MICROALBUMIN / CREATININE URINE RATIO
Creatinine, Urine: 65.7 mg/dL
Microalb/Creat Ratio: 5 mg/g{creat} (ref 0–29)
Microalbumin, Urine: 3.2 ug/mL

## 2023-01-09 LAB — URINE CULTURE

## 2023-01-12 ENCOUNTER — Encounter: Payer: Self-pay | Admitting: Internal Medicine

## 2023-01-13 ENCOUNTER — Ambulatory Visit
Admission: RE | Admit: 2023-01-13 | Discharge: 2023-01-13 | Disposition: A | Discharge status: Home / Self Care | Payer: BC Managed Care – PPO | Source: Ambulatory Visit | Attending: Internal Medicine | Admitting: Internal Medicine

## 2023-01-13 DIAGNOSIS — E049 Nontoxic goiter, unspecified: Secondary | ICD-10-CM

## 2023-03-03 ENCOUNTER — Other Ambulatory Visit: Payer: Self-pay | Admitting: Internal Medicine

## 2023-03-03 DIAGNOSIS — Z1231 Encounter for screening mammogram for malignant neoplasm of breast: Secondary | ICD-10-CM

## 2023-04-20 ENCOUNTER — Ambulatory Visit
Admission: RE | Admit: 2023-04-20 | Discharge: 2023-04-20 | Disposition: A | Discharge status: Home / Self Care | Payer: BC Managed Care – PPO | Source: Ambulatory Visit | Attending: Internal Medicine | Admitting: Internal Medicine

## 2023-04-20 DIAGNOSIS — Z1231 Encounter for screening mammogram for malignant neoplasm of breast: Secondary | ICD-10-CM

## 2023-06-10 ENCOUNTER — Telehealth: Payer: Self-pay | Admitting: Cardiovascular Disease

## 2023-06-10 ENCOUNTER — Encounter (HOSPITAL_COMMUNITY): Payer: Self-pay

## 2023-06-10 ENCOUNTER — Other Ambulatory Visit: Payer: Self-pay

## 2023-06-10 ENCOUNTER — Emergency Department (HOSPITAL_COMMUNITY)

## 2023-06-10 ENCOUNTER — Emergency Department (HOSPITAL_COMMUNITY)
Admission: EM | Admit: 2023-06-10 | Discharge: 2023-06-11 | Disposition: A | Discharge status: Home / Self Care | Attending: Student | Admitting: Student

## 2023-06-10 DIAGNOSIS — I10 Essential (primary) hypertension: Secondary | ICD-10-CM | POA: Insufficient documentation

## 2023-06-10 DIAGNOSIS — D649 Anemia, unspecified: Secondary | ICD-10-CM | POA: Diagnosis not present

## 2023-06-10 DIAGNOSIS — R0789 Other chest pain: Secondary | ICD-10-CM | POA: Diagnosis present

## 2023-06-10 LAB — CBC
HCT: 35.2 % — ABNORMAL LOW (ref 36.0–46.0)
Hemoglobin: 11.3 g/dL — ABNORMAL LOW (ref 12.0–15.0)
MCH: 27.6 pg (ref 26.0–34.0)
MCHC: 32.1 g/dL (ref 30.0–36.0)
MCV: 85.9 fL (ref 80.0–100.0)
Platelets: 180 10*3/uL (ref 150–400)
RBC: 4.1 MIL/uL (ref 3.87–5.11)
RDW: 12.2 % (ref 11.5–15.5)
WBC: 5.5 10*3/uL (ref 4.0–10.5)
nRBC: 0 % (ref 0.0–0.2)

## 2023-06-10 LAB — BASIC METABOLIC PANEL
Anion gap: 13 (ref 5–15)
BUN: 11 mg/dL (ref 6–20)
CO2: 22 mmol/L (ref 22–32)
Calcium: 9.4 mg/dL (ref 8.9–10.3)
Chloride: 106 mmol/L (ref 98–111)
Creatinine, Ser: 0.79 mg/dL (ref 0.44–1.00)
GFR, Estimated: >60 mL/min (ref >60)
Glucose, Bld: 146 mg/dL — ABNORMAL HIGH (ref 70–99)
Potassium: 3.9 mmol/L (ref 3.5–5.1)
Sodium: 141 mmol/L (ref 135–145)

## 2023-06-10 LAB — TROPONIN I (HIGH SENSITIVITY)
Troponin I (High Sensitivity): 4 ng/L (ref <18)
Troponin I (High Sensitivity): 3 ng/L (ref <18)

## 2023-06-10 NOTE — ED Triage Notes (Signed)
 Pt states that she has a dull ache on left side of chest x 3 days. Pain is intermittent. Denies SOB. Pt does have history of MI in 2020

## 2023-06-10 NOTE — Telephone Encounter (Signed)
 Spoke with pt and who complains of active left sided chest pain.  She reports this has bee intermittent for the past 3 days.  She denies shortness of breath, dizziness, nausea or vomiting.  Current BP 138/88 and HR-57.  Pt advised with active chest pain would recommend pt be seen in the ED for further evaluation. Pt advised she should not drive herself.  Pt verbalizes understanding and agrees with current plan.

## 2023-06-10 NOTE — ED Provider Triage Note (Signed)
 Emergency Medicine Provider Triage Evaluation Note  Jessica Parks , a 58 y.o. female  was evaluated in triage.  Pt complains of chest pain.  Left-sided.  Intermittent over the past 3 days.  Very mild.  No shortness of breath.  Not exertional.  Review of Systems  Positive: As above Negative: As above  Physical Exam  BP (!) 170/87 (BP Location: Right Arm)   Pulse 86   Temp 98.3 F (36.8 C)   Resp 18   LMP 09/05/2021 (Exact Date)   SpO2 99%  Gen:   Awake, no distress   Resp:  Normal effort  MSK:   Moves extremities without difficulty  Other:    Medical Decision Making  Medically screening exam initiated at 7:35 PM.  Appropriate orders placed.  Jessica Parks was informed that the remainder of the evaluation will be completed by another provider, this initial triage assessment does not replace that evaluation, and the importance of remaining in the ED until their evaluation is complete.  Work up initiated   Jessica Parks 06/10/23 1935

## 2023-06-10 NOTE — Telephone Encounter (Signed)
   Pt c/o of Chest Pain: STAT if active CP, including tightness, pressure, jaw pain, radiating pain to shoulder/upper arm/back, CP unrelieved by Nitro. Symptoms reported of SOB, nausea, vomiting, sweating.  1. Are you having CP right now? YES     2. Are you experiencing any other symptoms (ex. SOB, nausea, vomiting, sweating)? NO   3. Is your CP continuous or coming and going? COMING AND GOING   4. Have you taken Nitroglycerin? NO    5. How long have you been experiencing CP? 3 DAYS    6. If NO CP at time of call then end call with telling Pt to call back or call 911 if Chest pain returns prior to return call from triage team.

## 2023-06-11 MED ORDER — ALUM & MAG HYDROXIDE-SIMETH 200-200-20 MG/5ML PO SUSP
30.0000 mL | Freq: Once | ORAL | Status: AC
  Administered 2023-06-11: 30 mL via ORAL
  Filled 2023-06-11: qty 30

## 2023-06-11 MED ORDER — LIDOCAINE VISCOUS HCL 2 % MT SOLN
15.0000 mL | Freq: Once | OROMUCOSAL | Status: AC
  Administered 2023-06-11: 15 mL via ORAL
  Filled 2023-06-11: qty 15

## 2023-06-11 NOTE — Discharge Instructions (Addendum)
 Your workup today is reassuring. It is very unlikely that your pain is due to an issue in your heart.  Your cardiac enzyme (troponin) was normal today. Your EKG which is a measure of the heart's electrical activity and rhythm is normal today. These would both show abnormalities if you were having a heart attack.  Your chest x-ray is normal today.  Your blood pressure is elevated today.  As discussed, please record blood pressures at home and follow-up with your primary within the next 2 weeks for further blood pressure management and follow-up of your symptoms.  Return to the ER if you have any shortness of breath, difficulty breathing, worsening chest pain, dizziness, jaw pain, left arm or shoulder pain, abdominal pain, unexplained fever, any other new or concerning symptoms.

## 2023-06-11 NOTE — ED Provider Notes (Signed)
 [place] EMERGENCY DEPARTMENT AT [facility] Provider Note   CSN: [id number] Arrival date & time: 06/10/23  1913     History  Chief Complaint  Patient presents with   Chest Pain    Jessica Parks is a 58 y.o. female with history of NSTEMI, hypertension, presents with concern for an intermittent left-sided chest pain over the past week.  States the pain comes and goes at random.  It is non exertional, nonpleuritic. Does not radiate to her back.  She denies any shortness of breath.  She denies any recent long plane or car rides, recent hospitalizations or surgeries, or hormonal medication use.  Denies any pain or swelling in her legs.  She denies any cough, congestion, fever or chills.  She reports she has been under a lot of stress recently, her aunt just passed today and she has been caring for her over the last week   Chest Pain      Home Medications Prior to Admission medications   Medication Sig Start Date End Date Taking? Authorizing Provider  amLODipine (NORVASC) 2.5 MG tablet Take 1 tablet (2.5 mg total) by mouth daily. Patient not taking: Reported on 06/17/2022 03/18/22 03/18/23  Dorothyann Peng, MD  Multiple Vitamins-Minerals (MULTI-VITAMIN GUMMIES PO) Take by mouth.    [provider]  polyethylene glycol (MIRALAX / GLYCOLAX) 17 g packet Take 17 g by mouth daily.    [provider]  Psyllium (METAMUCIL FIBER PO) Take by mouth.    [provider]  VITAMIN D PO Take by mouth. drops    [provider]      Allergies    Epinephrine and Oxycodone    Review of Systems   Review of Systems  Cardiovascular:  Positive for chest pain.    Physical Exam Updated Vital Signs BP (!) 169/87 (BP Location: Left Arm)   Pulse 68   Temp 98.6 F (37 C)   Resp 20   Ht 5\' 6"  (1.676 m)   Wt 72.4 kg   LMP 09/05/2021 (Exact Date)   SpO2 100%   BMI 25.76 kg/m  Physical Exam Vitals and nursing note reviewed.  Constitutional:       General: She is not in acute distress.    Appearance: She is well-developed.  HENT:     Head: Normocephalic and atraumatic.  Eyes:     Conjunctiva/sclera: Conjunctivae normal.  Cardiovascular:     Rate and Rhythm: Normal rate and regular rhythm.     Heart sounds: No murmur heard.    Comments: Radial pulse 2+ bilaterally Dorsalis pedis pulse 2+ bilaterally Pulmonary:     Effort: Pulmonary effort is normal. No respiratory distress.     Breath sounds: Normal breath sounds.  Abdominal:     Palpations: Abdomen is soft.     Tenderness: There is no abdominal tenderness.  Musculoskeletal:        General: No swelling.     Cervical back: Neck supple.     Comments: No chest tenderness to palpation  No right or left lower extremity edema, no calf tenderness to palpation  Skin:    General: Skin is warm and dry.     Capillary Refill: Capillary refill takes less than 2 seconds.  Neurological:     Mental Status: She is alert.  Psychiatric:        Mood and Affect: Mood normal.     ED Results / Procedures / Treatments   Labs (all labs ordered are listed, but  only abnormal results are displayed) Labs Reviewed  BASIC METABOLIC PANEL - Abnormal; Notable for the following components:      Result Value   Glucose, Bld 146 (*)    All other components within normal limits  CBC - Abnormal; Notable for the following components:   Hemoglobin 11.3 (*)    HCT 35.2 (*)    All other components within normal limits  TROPONIN I (HIGH SENSITIVITY)  TROPONIN I (HIGH SENSITIVITY)    EKG EKG Interpretation Date/Time:  Wednesday June 10 2023 19:18:41 EST Ventricular Rate:  87 PR Interval:  142 QRS Duration:  94 QT Interval:  392 QTC Calculation: 471 R Axis:   -50  Text Interpretation: Normal sinus rhythm Left anterior fascicular block Nonspecific ST abnormality Abnormal ECG Confirmed by Tilden Fossa [phone]) on 06/10/2023 11:51:24 PM  Radiology DG Chest 2 View Result Date: 06/10/2023 CLINICAL  DATA:  Chest pain.  Previous myocardial infarct. EXAM: CHEST - 2 VIEW COMPARISON:  06/12/2018 FINDINGS: The heart size and mediastinal contours are within normal limits. Both lungs are clear. The visualized skeletal structures are unremarkable. IMPRESSION: No active cardiopulmonary disease. Electronically Signed   By: Danae Orleans M.D.   On: 06/10/2023 21:37    Procedures Procedures    Medications Ordered in ED Medications  alum & mag hydroxide-simeth (MAALOX/MYLANTA) 200-200-20 MG/5ML suspension 30 mL (30 mLs Oral Given 06/11/23 0055)    And  lidocaine (XYLOCAINE) 2 % viscous mouth solution 15 mL (15 mLs Oral Given 06/11/23 0055)    ED Course/ Medical Decision Making/ A&P             HEART Score: 3                    Medical Decision Making Amount and/or Complexity of Data Reviewed Labs: ordered. Radiology: ordered.  Risk OTC drugs. Prescription drug management.     Differential diagnosis includes but is not limited to ACS, arrhythmia, aortic aneurysm, pericarditis, myocarditis, pericardial effusion, cardiac tamponade, musculoskeletal pain, GERD, Boerhaave's syndrome, DVT/PE, pneumonia, pleural effusion   ED Course:  Patient is well-appearing, stable vital signs aside from a elevated blood pressure 169/87.  Low concern for ACS at this time given troponin remains stable with initial troponin of 4 and repeat of 3, pain non-exertional and has been ongoing for a week, EKG without any signs of ischemia. HEART score of 3, low risk of MACE. Chest x-ray without any acute abnormality. Low concern for dissection. Low concern for DVT or PE at this time given patient denies any recent periods of immobilization, hormonal medication use, lower extremity pain or swelling. Patient given maalox for her symptoms.   I Ordered, and personally interpreted labs.  The pertinent results include:   CBC without leukocytosis.  Hemoglobin at 11.3 which is at baseline BMP unremarkable aside from elevated  glucose of 146 Troponin of 4 and 3  Upon re-evaluation, patient states she is feeling well. No chest pain currently. She believes this is likely stress related as she has been caregiving for her aunt who just passed away today.  I discussed this case with my attending Dr. Posey Rea who also agrees patient is stable and appropriate for discharge home this time  Impression: Atypical chest pain  Disposition:  The patient was discharged home with instructions to follow-up with PCP within the next 2 weeks for recheck of her symptoms and recheck of blood pressure. Return precautions given.  Imaging Studies ordered: I ordered imaging studies including chest x-ray I  independently visualized the imaging with scope of interpretation limited to determining acute life threatening conditions related to emergency care. Imaging showed no acute abnormalities I agree with the radiologist interpretation   Cardiac Monitoring: / EKG: The patient was maintained on a cardiac monitor.  I personally viewed and interpreted the cardiac monitored which showed an underlying rhythm of: Normal sinus rhythm, no ST elevations               Final Clinical Impression(s) / ED Diagnoses Final diagnoses:  Atypical chest pain    Rx / DC Orders ED Discharge Orders     None         Arabella Merles, PA-C 06/11/23 0102    Glendora Score, MD 06/11/23 [phone]

## 2023-08-14 LAB — AMB RESULTS CONSOLE CBG: Glucose: 116

## 2023-08-14 NOTE — Progress Notes (Signed)
 Patient came to mobile screening at [facility]. Patient stated she does not take any medication for blood pressure or sugar. Just completed a dance class less than 10 minutes prior to check. Non fasting glucose 116. BP 162/94, 151/96. Patient stated that her BP is never that elevated as she regularly checks at home. We discussed active lifestyle means and continuous monitoring of BP. No SDOH needs currently.

## 2023-11-09 NOTE — Progress Notes (Signed)
 The patient attended a screening event on 08/14/23 where her screening results were 151/96 for BP and blood glucose was 116mg /dl. At the event the patient noted no sdoh, has blue cross blue shield, and doesn't smoke.. Patient was recommended to check BP regularly.   Per chart review the pt has a pcp and the last office visit was 01/07/23. Pt has amlodipine  listed under current meds. Plan note from pcp Her blood pressure has improved with regular exercise. She was congratulated for the improvement of her condition. Encouraged to continue to follow low sodium diet. Chart review also indicates a future appt on 01/12/24. [facility] has sent pt low sodium diet and blood pressure resources as a curtesy. No additional [facility] [employer] team support indicated at this time.

## 2024-01-12 ENCOUNTER — Ambulatory Visit (INDEPENDENT_AMBULATORY_CARE_PROVIDER_SITE_OTHER): Payer: BC Managed Care – PPO | Admitting: Internal Medicine

## 2024-01-12 ENCOUNTER — Encounter: Payer: Self-pay | Admitting: Internal Medicine

## 2024-01-12 VITALS — BP 120/70 | HR 83 | Temp 98.2°F | Ht 66.0 in | Wt 160.0 lb

## 2024-01-12 DIAGNOSIS — Z8249 Family history of ischemic heart disease and other diseases of the circulatory system: Secondary | ICD-10-CM

## 2024-01-12 DIAGNOSIS — E559 Vitamin D deficiency, unspecified: Secondary | ICD-10-CM

## 2024-01-12 DIAGNOSIS — Z8679 Personal history of other diseases of the circulatory system: Secondary | ICD-10-CM | POA: Diagnosis not present

## 2024-01-12 DIAGNOSIS — Z0000 Encounter for general adult medical examination without abnormal findings: Secondary | ICD-10-CM

## 2024-01-12 DIAGNOSIS — Z2821 Immunization not carried out because of patient refusal: Secondary | ICD-10-CM

## 2024-01-12 NOTE — Assessment & Plan Note (Signed)
A full exam was performed.  Importance of monthly self breast exams was discussed with the patient.  I will request notes/pap smear results from her GYN.  She is advised to get 30-45 minutes of regular exercise, no less than four to five days per week. Both weight-bearing and aerobic exercises are recommended.  She is advised to follow a healthy diet with at least six fruits/veggies per day, decrease intake of red meat and other saturated fats and to increase fish intake to twice weekly.  Meats/fish should not be fried -- baked, boiled or broiled is preferable. It is also important to cut back on your sugar intake.  Be sure to read labels - try to avoid anything with added sugar, high fructose corn syrup or other sweeteners.  If you must use a sweetener, you can try stevia or monkfruit.  It is also important to avoid artificially sweetened foods/beverages and diet drinks. Lastly, wear SPF 50 sunscreen on exposed skin and when in direct sunlight for an extended period of time.  Be sure to avoid fast food restaurants and aim for at least 60 ounces of water daily.

## 2024-01-12 NOTE — Patient Instructions (Signed)

## 2024-01-12 NOTE — Progress Notes (Signed)
 I,Jameka J Llittleton, CMA,acting as a Neurosurgeon for Catheryn LOISE Slocumb, MD.,have documented all relevant documentation on the behalf of Catheryn LOISE Slocumb, MD,as directed by  Catheryn LOISE Slocumb, MD while in the presence of Catheryn LOISE Slocumb, MD.  Subjective:    [name] , female    DOB: Mar 09, 1966 , 58 y.o.   MRN: [id number]  Chief Complaint  Patient presents with   Annual Exam    Patient here for annual exam. She is followed by Dr. Rutherford for her GYN exams. She has no specific concerns or complaints at this time.     HPI Discussed the use of AI scribe software for clinical note transcription with the patient, who gave verbal consent to proceed.  History of Present Illness Jessica Parks is a 58 year old female who presents for an annual physical exam.  She is scheduled for a Pap smear next month and a breast exam in January. Her last mammogram was in January of this year, and she is due for a colonoscopy next year.  She has a family history of heart disease on her father's side and breast cancer on her mother's side, specifically her aunties. She was anemic earlier this year but is no longer menstruating. She suspects internal hemorrhoids but has no external ones.  She discontinued her blood pressure medication due to hypotension. She maintains a diet rich in fruits, vegetables, and salmon, though she is tired of eating salmon. She exercises regularly and drinks a lot of water, with bowel movements occurring every other day.  In March, she experienced chest pains, which she attributes to anxiety following her aunt's death from a brain tumor. She visited the emergency room after experiencing chest pains in March.  She was not found to have any issues. She has not experienced any recurrence of her sx.     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/28/2018   Hemorrhoids       Family History  Problem Relation Age of Onset   Hypertension Father    Kidney failure Father    Breast cancer Maternal Aunt        35s   Breast cancer Maternal Aunt        60s   Diabetes Maternal Aunt    Breast cancer Maternal Grandmother        50s   Colon cancer Neg Hx    Rectal cancer Neg Hx      Current Outpatient Medications:    polyethylene glycol (MIRALAX / GLYCOLAX) 17 g packet, Take 17 g by mouth daily., Disp: , Rfl:    VITAMIN D  PO, Take by mouth. drops, Disp: , Rfl:    Vitamin D -Vitamin K (D3 + K2 PO), Take 25 mLs by mouth daily at 12 noon., Disp: , Rfl:    amLODipine  (NORVASC ) 2.5 MG tablet, Take 1 tablet (2.5 mg total) by mouth daily. (Patient not taking: Reported on 01/12/2024), Disp: 30 tablet, Rfl: 11   Multiple Vitamins-Minerals (MULTI-VITAMIN GUMMIES PO), Take by mouth. (Patient not taking: Reported on 01/12/2024), Disp: , Rfl:    Allergies  Allergen Reactions   Epinephrine      Coronary vasospasm from regional block injection  Pt states is not allergic   Oxycodone  Nausea Only    Dizziness & nausea      The  patient states she uses none for birth control. Patient's last menstrual period was 09/05/2021 (exact date).. Negative for Dysmenorrhea. Negative for: breast discharge, breast lump(s), breast pain and breast self exam. Associated symptoms include abnormal vaginal bleeding. Pertinent negatives include abnormal bleeding (hematology), anxiety, decreased libido, depression, difficulty falling sleep, dyspareunia, history of infertility, nocturia, sexual dysfunction, sleep disturbances, urinary incontinence, urinary urgency, vaginal discharge and vaginal itching. Diet regular.The patient states her exercise level is  moderate.  . The patient's tobacco use is:  Social History   Tobacco Use  Smoking Status Never  Smokeless Tobacco Never  . She has been exposed to passive smoke. The patient's alcohol use is:  Social History   Substance and Sexual Activity   Alcohol Use Yes   Comment: socially   Review of Systems  Constitutional: Negative.   HENT: Negative.    Eyes: Negative.   Respiratory: Negative.    Cardiovascular: Negative.   Gastrointestinal: Negative.   Endocrine: Negative.   Genitourinary: Negative.   Musculoskeletal: Negative.   Skin: Negative.   Allergic/Immunologic: Negative.   Neurological: Negative.   Hematological: Negative.   Psychiatric/Behavioral: Negative.       Today's Vitals   01/12/24 1537  BP: 120/70  Pulse: 83  Temp: 98.2 F (36.8 C)  TempSrc: Oral  Weight: 160 lb (72.6 kg)  Height: 5' 6 (1.676 m)  PainSc: 0-No pain   Body mass index is 25.82 kg/m.  Wt Readings from Last 3 Encounters:  01/12/24 160 lb (72.6 kg)  06/11/23 159 lb 9.8 oz (72.4 kg)  01/07/23 159 lb 9.6 oz (72.4 kg)     Objective:  Physical Exam Vitals and nursing note reviewed.  Constitutional:      Appearance: Normal appearance.  HENT:     Head: Normocephalic and atraumatic.     Right Ear: Tympanic membrane, ear canal and external ear normal.     Left Ear: Tympanic membrane, ear canal and external ear normal.     Nose: Nose normal.     Mouth/Throat:     Mouth: Mucous membranes are moist.     Pharynx: Oropharynx is clear.  Eyes:     Extraocular Movements: Extraocular movements intact.     Conjunctiva/sclera: Conjunctivae normal.     Pupils: Pupils are equal, round, and reactive to light.  Neck:     Thyroid : Thyromegaly present.  Cardiovascular:     Rate and Rhythm: Normal rate and regular rhythm.     Pulses: Normal pulses.     Heart sounds: Normal heart sounds.  Pulmonary:     Effort: Pulmonary effort is normal.     Breath sounds: Normal breath sounds.  Chest:  Breasts:    Tanner Score is 5.     Right: Normal.     Left: Normal.  Abdominal:     General: Abdomen is flat. Bowel sounds are normal.     Palpations: Abdomen is soft.  Genitourinary:    Comments: deferred Musculoskeletal:        General: Normal  range of motion.     Cervical back: Normal range of motion and neck supple.  Skin:    General: Skin is warm and dry.  Neurological:     General: No focal deficit present.     Mental Status: She is alert and oriented to person, place, and time.  Psychiatric:        Mood and Affect: Mood normal.        Behavior: Behavior normal.  Assessment And Plan:     Encounter for general adult medical examination w/o abnormal findings Assessment & Plan: A full exam was performed.  Importance of monthly self breast exams was discussed with the patient.  I will request notes/pap smear results from her GYN.  She is advised to get 30-45 minutes of regular exercise, no less than four to five days per week. Both weight-bearing and aerobic exercises are recommended.  She is advised to follow a healthy diet with at least six fruits/veggies per day, decrease intake of red meat and other saturated fats and to increase fish intake to twice weekly.  Meats/fish should not be fried -- baked, boiled or broiled is preferable. It is also important to cut back on your sugar intake.  Be sure to read labels - try to avoid anything with added sugar, high fructose corn syrup or other sweeteners.  If you must use a sweetener, you can try stevia or monkfruit.  It is also important to avoid artificially sweetened foods/beverages and diet drinks. Lastly, wear SPF 50 sunscreen on exposed skin and when in direct sunlight for an extended period of time.  Be sure to avoid fast food restaurants and aim for at least 60 ounces of water daily.      Orders: -     CBC -     CMP14+EGFR -     Lipid panel  Vitamin D  deficiency disease Assessment & Plan: I will check vitamin D  level and  supplement as needed .  Orders: -     VITAMIN D  25 Hydroxy (Vit-D Deficiency, Fractures)  History of high blood pressure Assessment & Plan: Blood pressure well-controlled. Previously stopped medication due to low readings. Discussed monitoring  during chest pain or stress to assess trends. - Monitor blood pressure regularly. - Check blood pressure during episodes of chest pain or stress.   Family history of heart disease Assessment & Plan: - Provide information on cardiac calcium  scoring. - Schedule cardiac calcium  scoring [facility] [address].  Orders: -     CT CARDIAC SCORING (SELF PAY ONLY); Future  Influenza vaccination declined  Herpes zoster vaccination declined  Pneumococcal vaccination declined    Return for 1 year physical. Patient was given opportunity to ask questions. Patient verbalized understanding of the plan and was able to repeat key elements of the plan. All questions were answered to their satisfaction.   I, Catheryn LOISE Slocumb, MD, have reviewed all documentation for this visit. The documentation on 01/12/24 for the exam, diagnosis, procedures, and orders are all accurate and complete.

## 2024-01-13 LAB — CMP14+EGFR
ALT: 11 IU/L (ref 0–32)
AST: 16 IU/L (ref 0–40)
Albumin: 4.6 g/dL (ref 3.8–4.9)
Alkaline Phosphatase: 81 IU/L (ref 49–135)
BUN/Creatinine Ratio: 17 (ref 9–23)
BUN: 12 mg/dL (ref 6–24)
Bilirubin Total: 0.6 mg/dL (ref 0.0–1.2)
CO2: 24 mmol/L (ref 20–29)
Calcium: 9.4 mg/dL (ref 8.7–10.2)
Chloride: 101 mmol/L (ref 96–106)
Creatinine, Ser: 0.71 mg/dL (ref 0.57–1.00)
Globulin, Total: 2.7 g/dL (ref 1.5–4.5)
Glucose: 112 mg/dL — ABNORMAL HIGH (ref 70–99)
Potassium: 3.9 mmol/L (ref 3.5–5.2)
Sodium: 138 mmol/L (ref 134–144)
Total Protein: 7.3 g/dL (ref 6.0–8.5)
eGFR: 99 mL/min/1.73 (ref >59)

## 2024-01-13 LAB — CBC
Hematocrit: 36.3 % (ref 34.0–46.6)
Hemoglobin: 11.3 g/dL (ref 11.1–15.9)
MCH: 27 pg (ref 26.6–33.0)
MCHC: 31.1 g/dL — ABNORMAL LOW (ref 31.5–35.7)
MCV: 87 fL (ref 79–97)
Platelets: 193 x10E3/uL (ref 150–450)
RBC: 4.19 x10E6/uL (ref 3.77–5.28)
RDW: 11.5 % — ABNORMAL LOW (ref 11.7–15.4)
WBC: 3.7 x10E3/uL (ref 3.4–10.8)

## 2024-01-13 LAB — LIPID PANEL
Chol/HDL Ratio: 3.3 ratio (ref 0.0–4.4)
Cholesterol, Total: 176 mg/dL (ref 100–199)
HDL: 54 mg/dL (ref >39)
LDL Chol Calc (NIH): 94 mg/dL (ref 0–99)
Triglycerides: 165 mg/dL — ABNORMAL HIGH (ref 0–149)
VLDL Cholesterol Cal: 28 mg/dL (ref 5–40)

## 2024-01-13 LAB — VITAMIN D 25 HYDROXY (VIT D DEFICIENCY, FRACTURES): Vit D, 25-Hydroxy: 40.3 ng/mL (ref 30.0–100.0)

## 2024-01-14 ENCOUNTER — Ambulatory Visit: Payer: Self-pay | Admitting: Internal Medicine

## 2024-01-17 DIAGNOSIS — Z8249 Family history of ischemic heart disease and other diseases of the circulatory system: Secondary | ICD-10-CM | POA: Insufficient documentation

## 2024-01-17 DIAGNOSIS — Z8679 Personal history of other diseases of the circulatory system: Secondary | ICD-10-CM | POA: Insufficient documentation

## 2024-01-17 NOTE — Assessment & Plan Note (Signed)
 Blood pressure well-controlled. Previously stopped medication due to low readings. Discussed monitoring during chest pain or stress to assess trends. - Monitor blood pressure regularly. - Check blood pressure during episodes of chest pain or stress.

## 2024-01-17 NOTE — Assessment & Plan Note (Signed)
 I will check vitamin D level and supplement as needed.

## 2024-01-17 NOTE — Assessment & Plan Note (Signed)
-   Provide information on cardiac calcium  scoring. - Schedule cardiac calcium  scoring at the [facility] [address].

## 2024-01-20 ENCOUNTER — Other Ambulatory Visit: Payer: Self-pay | Admitting: Internal Medicine

## 2024-01-20 DIAGNOSIS — Z1231 Encounter for screening mammogram for malignant neoplasm of breast: Secondary | ICD-10-CM

## 2024-02-19 ENCOUNTER — Ambulatory Visit (HOSPITAL_COMMUNITY)
Admission: RE | Admit: 2024-02-19 | Discharge: 2024-02-19 | Disposition: A | Discharge status: Home / Self Care | Payer: Self-pay | Source: Ambulatory Visit | Attending: Internal Medicine | Admitting: Internal Medicine

## 2024-02-19 DIAGNOSIS — Z8249 Family history of ischemic heart disease and other diseases of the circulatory system: Secondary | ICD-10-CM | POA: Insufficient documentation

## 2024-04-19 ENCOUNTER — Ambulatory Visit: Payer: Self-pay | Admitting: Internal Medicine

## 2024-04-19 DIAGNOSIS — E782 Mixed hyperlipidemia: Secondary | ICD-10-CM

## 2024-04-19 NOTE — Progress Notes (Signed)
 Pt was scheduled for chol check, she declined to be seen by provider. Also, she is not fasting. Per her preference, she will rto for lab visit.  RS

## 2024-04-22 ENCOUNTER — Ambulatory Visit
Admission: RE | Admit: 2024-04-22 | Discharge: 2024-04-22 | Disposition: A | Discharge status: Home / Self Care | Source: Ambulatory Visit | Attending: Internal Medicine | Admitting: Internal Medicine

## 2024-04-22 DIAGNOSIS — Z1231 Encounter for screening mammogram for malignant neoplasm of breast: Secondary | ICD-10-CM

## 2024-04-29 ENCOUNTER — Other Ambulatory Visit: Payer: Self-pay

## 2024-04-29 ENCOUNTER — Ambulatory Visit: Admitting: Physician Assistant

## 2024-04-29 DIAGNOSIS — M25551 Pain in right hip: Secondary | ICD-10-CM

## 2024-04-29 DIAGNOSIS — M25561 Pain in right knee: Secondary | ICD-10-CM

## 2024-04-29 DIAGNOSIS — G8929 Other chronic pain: Secondary | ICD-10-CM | POA: Diagnosis not present

## 2024-04-29 NOTE — Progress Notes (Signed)
 "  Office Visit Note   Patient: Jessica Parks           Date of Birth: February 20, 1966           MRN: [id number] Visit Date: 04/29/2024              Requested by: Jarold Medici, MD [address] [place],  [place] [id number] PCP: Jarold Medici, MD   Assessment & Plan: Visit Diagnoses:  1. Chronic pain of right knee   2. Pain in right hip     Plan: Impression is intermittent right knee pain likely from underlying arthritis flare and right hip trochanteric bursitis.  Regards to the knee, currently not experiencing any symptoms.  She will continue to treat this symptomatically.  Regards to the hip, we have discussed cortisone injection as well as IT band exercises.  She is not experiencing significant pain and does not feel she needs a cortisone injection at this time.  She will proceed with IT band stretches.  Follow-up as needed.  Call with concerns or questions.  Follow-Up Instructions: Return if symptoms worsen or fail to improve.   Orders:  Orders Placed This Encounter  Procedures   XR KNEE 3 VIEW RIGHT   XR HIP UNILAT W OR W/O PELVIS 2-3 VIEWS RIGHT   No orders of the defined types were placed in this encounter.     Procedures: No procedures performed   Clinical Data: No additional findings.   Subjective: Chief Complaint  Patient presents with   Right Knee - Pain   Right Hip - Pain    HPI patient is a pleasant 59 year old female who comes in today with right hip and knee pain.  Regards to the right hip, symptoms began about a month ago.  Pain is intermittent without any specific aggravators.  The pain she has is described as a twinge that is to the lateral aspect.  No pain in the groin or anterior thigh.  No pain distal to the knee.  No associated weakness or paresthesias to the right lower extremity.  She does take occasional ibuprofen .  In regards to the knee, she has intermittent pain which has occurred in the past with kneeling.  Currently not  experiencing any pain.  Review of Systems as detailed in HPI.  All others reviewed and are negative.     Objective: Vital Signs: LMP 09/05/2021   Physical Exam well-developed well-nourished female in no acute distress.  Alert and oriented x 3.  Ortho Exam right knee exam: Unremarkable.  Right hip exam: Mild to moderate tenderness to the greater trochanter.  No pain with logroll or FADIR testing.  She is neurovascularly intact distally.  Specialty Comments:  No specialty comments available.  Imaging: No results found.   PMFS History: Patient Active Problem List   Diagnosis Date Noted   Family history of heart disease 01/17/2024   History of high blood pressure 01/17/2024   Encounter for general adult medical examination w/o abnormal findings 01/07/2023   Elevated blood pressure reading 01/07/2023   Leukocytes in urine 01/07/2023   Vitamin D  deficiency disease 01/07/2023   Goiter 01/07/2023   NSTEMI (non-ST elevated myocardial infarction) (HCC) 07/05/2018   Encounter for intubation    Adhesive capsulitis of left shoulder 02/17/2018   Left shoulder pain 10/23/2017   Arthritis of right acromioclavicular joint 09/23/2016   History of colonic polyps 05/06/2011   Internal hemorrhoids 05/23/2009   CONSTIPATION 05/23/2009   Past Medical History:  Diagnosis Date  Anemia    Anxiety disorder    Arthritis    Coronary artery disease    Frozen shoulder    left- PT only 04/28/2018   Hemorrhoids     Family History  Problem Relation Age of Onset   Hypertension Father    Kidney failure Father    Breast cancer Maternal Aunt        70s   Breast cancer Maternal Aunt        60s   Diabetes Maternal Aunt    Breast cancer Maternal Grandmother        80s   Colon cancer Neg Hx    Rectal cancer Neg Hx     Past Surgical History:  Procedure Laterality Date   BREAST BIOPSY Left 2014   benign   HEMORRHOID SURGERY N/A 07/29/2019   Procedure: REMOVAL OF HEMORRHOID;  Surgeon: Sheldon Standing, MD;  Location: [facility];  Service: General;  Laterality: N/A;   MASS EXCISION N/A 07/29/2019   Procedure: EXCISION ANAL CANAL MASS;  Surgeon: Sheldon Standing, MD;  Location: [facility];  Service: General;  Laterality: N/A;   RECTAL EXAM UNDER ANESTHESIA N/A 07/29/2019   Procedure: ANORECTAL EXAM UNDER ANESTHESIA;  Surgeon: Sheldon Standing, MD;  Location: [facility] ;  Service: General;  Laterality: N/A;   SHOULDER ARTHROSCOPY WITH SUBACROMIAL DECOMPRESSION Left 06/11/2018   Procedure: LEFT SHOULDER ARTHROSCOPY WITH EXTENSIVE DEBRIDEMENT, LYSIS OF ADHESIONS, SUBACROMIAL DECOMPRESSION AND MANIPULATION UNDER ANESTHESIA;  Surgeon: Jerri Kay HERO, MD;  Location: [facility];  Service: Orthopedics;  Laterality: Left;   TUBAL LIGATION     Social History   Occupational History   Occupation: unemployed    Associate Professor: [employer]  Tobacco Use   Smoking status: Never   Smokeless tobacco: Never  Vaping Use   Vaping status: Never Used  Substance and Sexual Activity   Alcohol use: Yes    Comment: socially   Drug use: No   Sexual activity: Yes    Partners: Male    Birth control/protection: Surgical        "

## 2024-05-09 ENCOUNTER — Ambulatory Visit: Admitting: Internal Medicine

## 2024-05-30 ENCOUNTER — Other Ambulatory Visit: Payer: Self-pay

## 2025-01-12 ENCOUNTER — Encounter: Payer: Self-pay | Admitting: Internal Medicine
